# Patient Record
Sex: Male | Born: 1970 | Race: White | Hispanic: No | Marital: Single | Smoking: Current every day smoker
Health system: Southern US, Community
[De-identification: ages and names within clinical notes are randomized; demographics above are authoritative.]

## PROBLEM LIST (undated history)

## (undated) DIAGNOSIS — B1920 Unspecified viral hepatitis C without hepatic coma: Secondary | ICD-10-CM

## (undated) DIAGNOSIS — F1910 Other psychoactive substance abuse, uncomplicated: Secondary | ICD-10-CM

## (undated) DIAGNOSIS — T401X1A Poisoning by heroin, accidental (unintentional), initial encounter: Secondary | ICD-10-CM

## (undated) HISTORY — PX: LEG SURGERY: SHX1003

---

## 1998-10-20 ENCOUNTER — Inpatient Hospital Stay (HOSPITAL_COMMUNITY): Admission: EM | Admit: 1998-10-20 | Discharge: 1998-10-21 | Payer: Self-pay | Admitting: *Deleted

## 1998-10-20 ENCOUNTER — Emergency Department (HOSPITAL_COMMUNITY): Admission: EM | Admit: 1998-10-20 | Discharge: 1998-10-20 | Payer: Self-pay | Admitting: Emergency Medicine

## 1998-10-20 ENCOUNTER — Encounter: Payer: Self-pay | Admitting: Emergency Medicine

## 1998-10-20 ENCOUNTER — Encounter: Payer: Self-pay | Admitting: *Deleted

## 1999-03-25 ENCOUNTER — Encounter (HOSPITAL_COMMUNITY): Admission: RE | Admit: 1999-03-25 | Discharge: 1999-04-08 | Payer: Self-pay | Admitting: Psychiatry

## 2003-08-08 ENCOUNTER — Emergency Department (HOSPITAL_COMMUNITY): Admission: EM | Admit: 2003-08-08 | Discharge: 2003-08-08 | Payer: Self-pay | Admitting: Emergency Medicine

## 2003-10-30 ENCOUNTER — Inpatient Hospital Stay (HOSPITAL_COMMUNITY): Admission: AD | Admit: 2003-10-30 | Discharge: 2003-11-04 | Payer: Self-pay | Admitting: Psychiatry

## 2003-11-06 ENCOUNTER — Other Ambulatory Visit (HOSPITAL_COMMUNITY): Admission: RE | Admit: 2003-11-06 | Discharge: 2004-02-04 | Payer: Self-pay | Admitting: Psychiatry

## 2003-11-27 ENCOUNTER — Emergency Department (HOSPITAL_COMMUNITY): Admission: EM | Admit: 2003-11-27 | Discharge: 2003-11-27 | Payer: Self-pay | Admitting: Emergency Medicine

## 2004-02-19 ENCOUNTER — Emergency Department (HOSPITAL_COMMUNITY): Admission: EM | Admit: 2004-02-19 | Discharge: 2004-02-19 | Payer: Self-pay | Admitting: Emergency Medicine

## 2004-03-06 ENCOUNTER — Encounter: Admission: RE | Admit: 2004-03-06 | Discharge: 2004-03-06 | Payer: Self-pay | Admitting: Neurology

## 2005-07-06 ENCOUNTER — Emergency Department (HOSPITAL_COMMUNITY): Admission: EM | Admit: 2005-07-06 | Discharge: 2005-07-06 | Payer: Self-pay | Admitting: Emergency Medicine

## 2006-08-05 ENCOUNTER — Ambulatory Visit (HOSPITAL_COMMUNITY): Admission: AD | Admit: 2006-08-05 | Discharge: 2006-08-06 | Payer: Self-pay | Admitting: General Surgery

## 2008-03-23 ENCOUNTER — Emergency Department (HOSPITAL_COMMUNITY): Admission: EM | Admit: 2008-03-23 | Discharge: 2008-03-23 | Payer: Self-pay | Admitting: Emergency Medicine

## 2008-04-17 ENCOUNTER — Emergency Department (HOSPITAL_COMMUNITY): Admission: EM | Admit: 2008-04-17 | Discharge: 2008-04-17 | Payer: Self-pay | Admitting: Emergency Medicine

## 2008-07-12 ENCOUNTER — Emergency Department (HOSPITAL_BASED_OUTPATIENT_CLINIC_OR_DEPARTMENT_OTHER): Admission: EM | Admit: 2008-07-12 | Discharge: 2008-07-12 | Payer: Self-pay | Admitting: Emergency Medicine

## 2011-04-18 NOTE — Op Note (Signed)
NAME:  Joel Sheppard, Joel Sheppard              ACCOUNT NO.:  [id number]   MEDICAL RECORD NO.:  [id number]          PATIENT TYPE:  AMB   LOCATION:  DAY                          FACILITY:  [facility]   PHYSICIAN:  Angelia Mould. Derrell Lolling, M.D.DATE OF BIRTH:  07-Oct-1971   DATE OF PROCEDURE:  08/05/2006  DATE OF DISCHARGE:                                 OPERATIVE REPORT   PREOPERATIVE DIAGNOSIS:  Recurrent perirectal abscess.   POSTOPERATIVE DIAGNOSIS:  Recurrent perirectal abscess.   OPERATION PERFORMED:  Examination under anesthesia, anoscopy, incision and  drainage of recurrent perirectal abscess - right posterior and posterior  midline.   SURGEON:  Angelia Mould. Derrell Lolling, M.D.   OPERATIVE INDICATIONS:  This is a 40 year old white man who has had multiple  perirectal abscesses in the past.  He apparently was hospitalized in the  past and had extensive debridement on both sides of his perianal areas but  did not require colostomy.  He denies a history of Crohn's disease, states  his appetite is excellent.  Abdomen feels fine.  Denies diarrhea or change  in his bowel habits.  On exam, he has long complex bilateral scars almost  completely encircling the anus.  In the right posterior position, there is  tenderness and induration but no active drainage.  Right anterior, things  are fairly soft and nontender; left lateral, there is no sign of any  induration or infection.  He is brought to operating room for examination  under anesthesia and hopefully drainage of his abscess.   OPERATIVE TECHNIQUE:  Following the induction of general endotracheal  anesthesia, the patient was placed in a dorsolithotomy position in rigid  stirrups, and the perianal area was prepped and draped in sterile fashion.  I used a syringe and an 18-gauge needle to probe around the right posterior  position until I found a purulent fluid collection which was aspirated and  sent for routine culture.  I then reentered one of the old  incisions in the  right posterior position, made the incision about 3 cm in length and entered  the abscess cavity and drained it.  I digitally explored this cavity and  broke up loculations and found that it was quite localized.  I noticed a  little bit of purulent drainage from sinus tract in the posterior midline,  and I made a vertically sagittally oriented incision through this but really  did not find much of an abscess cavity there, although I did probe it and  explore it fairly extensively.  It did not seem to communicate with the  right posterior cavity.  There was no other induration or purulence or  drainage.  This wounds were irrigated copiously with saline.  Iodoform wicks  were placed in the wounds, and bulky absorbent bandages were placed  externally, and fishnet panties to hold these in place were placed.  The  patient tolerated the procedure well and was taken to the recovery room in  stable condition.  Estimated blood loss was about 30 mL.  Complications  none.  Sponge and instrument counts were correct.      Angelia Mould.  Derrell Lolling, M.D.  Electronically Signed     HMI/MEDQ  D:  08/05/2006  T:  08/06/2006  Job:  [id number]

## 2011-04-18 NOTE — Discharge Summary (Signed)
NAME:  Joel Sheppard, Joel Sheppard NO.:  [id number]   MEDICAL RECORD NO.:  [id number]                   PATIENT TYPE:  IPS   LOCATION:  0508                                 FACILITY:  [facility]   PHYSICIAN:  Geoffery Lyons, M.D.                   DATE OF BIRTH:  [birth year]   DATE OF ADMISSION:  10/30/2003  DATE OF DISCHARGE:  11/04/2003                                 DISCHARGE SUMMARY   CHIEF COMPLAINT AND PRESENT ILLNESS:  This was the first admission to [facility] for this 40 year old single white male voluntarily  admitted.  History of depression, suicidal ideation, no specific plan, crack  cocaine relapse.  Unable to control.  Lost her job due to absenteeism.  Increased sleep, 12-15 hours per day.  No change in appetite.  Denies any  hallucinations.  Noncompliant with medication.   PAST PSYCHIATRIC HISTORY:  Has seen Dr. Kathrynn Running but this is first time at  [facility].  Diagnosed bipolar.   ALCOHOL/DRUG HISTORY:  As already stated, ongoing use of cocaine.   MEDICAL HISTORY:  Noncontributory.   MEDICATIONS:  Depakote 500 mg in the morning and 1000 mg at night, Zoloft 50  mg per day, Seroquel 100 mg at bedtime.   PHYSICAL EXAMINATION:  Performed and failed to show any acute findings.   MENTAL STATUS EXAM:  Alert, cooperative male in bed.  Agreeable to answering  a few questions with bed sheet over her head.  Speech with yes and no  answers.  Mood tired, somewhat irritable.  Affect irritable.  Thought  process coherent.  Limited information.  No evidence of delusion.  Cognition  well-preserved.   ADMISSION DIAGNOSES:   AXIS I:  1. Mood disorder not otherwise specified.  2. Cocaine dependence.   AXIS II:  No diagnosis.   AXIS III:  No diagnosis.   AXIS IV:  Moderate.   AXIS V:  Global Assessment of Functioning upon admission 35; highest Global  Assessment of Functioning in the last year 65.   HOSPITAL COURSE:  He was admitted and  started intensive individual and group  psychotherapy.  He was kept on the Depakote 500 mg in the morning, 1000 mg  at night, Zoloft 50 mg daily, Seroquel 25 mg every six hours as needed and  100 mg at bedtime.  Depakote level was ordered.  Could not sleep at night.  Tired during the day.  Had anxiety and agitation, so Seroquel was increased  to 200 mg at bedtime and he was given 50 mg every six hours as needed for  anxiety and agitation.  Eventually, Seroquel was increased to 250 mg and the  Depakote was changed to 1500 mg at night.  It was later increased to 2000 mg  at night and the Seroquel was increased to 350 mg.  He was in his room a lot  of the time.  He did experience a lot of difficulty with the relapse,  difficulty coping with the depression, got with the wrong people, as he  claimed, relapsed.  Girlfriend told him pretty much that she was not willing  to put up with his behavior.  He wants out.  He became very upset, unable to  find a stable job, having back pain.  He lost his job due to absenteeism as  he claimed he could not go to work due to the back pain.  As the  hospitalization progressed, he became a little bit more involved.  There was  a family session with the girlfriend that did claim that she was tired as  she had a 58 year old addicted son.  They both recognized the need for  counseling.  On November 03, 2003, he was not suicidal.  Still dealing with  the financial difficulties, not having a job and relationship problems but  motivated to pursue further outpatient treatment.  On November 04, 2003, she  was in full contact with reality.  No suicidal ideation.  No homicidal  ideation.  No hallucinations.  No delusions.  Increased insight.  Willing to  pursue further outpatient treatment to the [facility].  We went ahead  and discharged to outpatient follow-up.   DISCHARGE DIAGNOSES:   AXIS I:  1. Mood disorder not otherwise specified.  2. Cocaine dependence.    AXIS II:  No diagnosis.   AXIS III:  No diagnosis.   AXIS IV:  Moderate.   AXIS V:  Global Assessment of Functioning upon discharge 50.   DISCHARGE MEDICATIONS:  1. Zoloft 50 mg per day.  2. Depakote ER 500 mg, 4 at night.  3. Seroquel 200 mg, 2 at night.  4. Symmetrel 100 mg twice a day.  5. Trazodone 100 mg at bedtime as needed.  6. Seroquel 25 mg, 2 twice a day as needed for anxiety and agitation.   FOLLOW UP:  Dr. Kathrynn Running and [facility].                                               Geoffery Lyons, M.D.    IL/MEDQ  D:  11/17/2003  T:  11/18/2003  Job:  [id number]

## 2011-08-29 LAB — WOUND CULTURE: Gram Stain: NONE SEEN

## 2016-01-06 ENCOUNTER — Encounter: Payer: Self-pay | Admitting: Emergency Medicine

## 2016-01-06 ENCOUNTER — Emergency Department
Admission: EM | Admit: 2016-01-06 | Discharge: 2016-01-06 | Disposition: A | Discharge status: Home / Self Care | Payer: BLUE CROSS/BLUE SHIELD | Source: Home / Self Care | Attending: Family Medicine | Admitting: Family Medicine

## 2016-01-06 DIAGNOSIS — S61411A Laceration without foreign body of right hand, initial encounter: Secondary | ICD-10-CM | POA: Diagnosis not present

## 2016-01-06 MED ORDER — IBUPROFEN 800 MG PO TABS
800.0000 mg | ORAL_TABLET | Freq: Once | ORAL | Status: AC
  Administered 2016-01-06: 800 mg via ORAL

## 2016-01-06 NOTE — Discharge Instructions (Signed)
Change dressing daily and apply Bacitracin ointment to wound.  Keep wound clean and dry.  Return for any signs of infection (or follow-up with family doctor):  Increasing redness, swelling, pain, heat, drainage, etc. °Return in 10 days for suture removal.   ° ° °Laceration Care, Adult °A laceration is a cut that goes through all of the layers of the skin and into the tissue that is right under the skin. Some lacerations heal on their own. Others need to be closed with stitches (sutures), staples, skin adhesive strips, or skin glue. Proper laceration care minimizes the risk of infection and helps the laceration to heal better. °HOW TO CARE FOR YOUR LACERATION °If sutures or staples were used: °· Keep the wound clean and dry. °· If you were given a bandage (dressing), you should change it at least one time per day or as told by your health care provider. You should also change it if it becomes wet or dirty. °· Keep the wound completely dry for the first 24 hours or as told by your health care provider. After that time, you may shower or bathe. However, make sure that the wound is not soaked in water until after the sutures or staples have been removed. °· Clean the wound one time each day or as told by your health care provider: °¨ Wash the wound with soap and water. °¨ Rinse the wound with water to remove all soap. °¨ Pat the wound dry with a clean towel. Do not rub the wound. °· After cleaning the wound, apply a thin layer of antibiotic ointment as told by your health care provider. This will help to prevent infection and keep the dressing from sticking to the wound. °· Have the sutures or staples removed as told by your health care provider. °If skin adhesive strips were used: °· Keep the wound clean and dry. °· If you were given a bandage (dressing), you should change it at least one time per day or as told by your health care provider. You should also change it if it becomes dirty or wet. °· Do not get the skin  adhesive strips wet. You may shower or bathe, but be careful to keep the wound dry. °· If the wound gets wet, pat it dry with a clean towel. Do not rub the wound. °· Skin adhesive strips fall off on their own. You may trim the strips as the wound heals. Do not remove skin adhesive strips that are still stuck to the wound. They will fall off in time. °If skin glue was used: °· Try to keep the wound dry, but you may briefly wet it in the shower or bath. Do not soak the wound in water, such as by swimming. °· After you have showered or bathed, gently pat the wound dry with a clean towel. Do not rub the wound. °· Do not do any activities that will make you sweat heavily until the skin glue has fallen off on its own. °· Do not apply liquid, cream, or ointment medicine to the wound while the skin glue is in place. Using those may loosen the film before the wound has healed. °· If you were given a bandage (dressing), you should change it at least one time per day or as told by your health care provider. You should also change it if it becomes dirty or wet. °· If a dressing is placed over the wound, be careful not to apply tape directly over the skin glue.   Doing that may cause the glue to be pulled off before the wound has healed. °· Do not pick at the glue. The skin glue usually remains in place for 5-10 days, then it falls off of the skin. °General Instructions °· Take over-the-counter and prescription medicines only as told by your health care provider. °· If you were prescribed an antibiotic medicine or ointment, take or apply it as told by your doctor. Do not stop using it even if your condition improves. °· To help prevent scarring, make sure to cover your wound with sunscreen whenever you are outside after stitches are removed, after adhesive strips are removed, or when glue remains in place and the wound is healed. Make sure to wear a sunscreen of at least 30 SPF. °· Do not scratch or pick at the wound. °· Keep all  follow-up visits as told by your health care provider. This is important. °· Check your wound every day for signs of infection. Watch for: °¨ Redness, swelling, or pain. °¨ Fluid, blood, or pus. °· Raise (elevate) the injured area above the level of your heart while you are sitting or lying down, if possible. °SEEK MEDICAL CARE IF: °· You received a tetanus shot and you have swelling, severe pain, redness, or bleeding at the injection site. °· You have a fever. °· A wound that was closed breaks open. °· You notice a bad smell coming from your wound or your dressing. °· You notice something coming out of the wound, such as wood or glass. °· Your pain is not controlled with medicine. °· You have increased redness, swelling, or pain at the site of your wound. °· You have fluid, blood, or pus coming from your wound. °· You notice a change in the color of your skin near your wound. °· You need to change the dressing frequently due to fluid, blood, or pus draining from the wound. °· You develop a new rash. °· You develop numbness around the wound. °SEEK IMMEDIATE MEDICAL CARE IF: °· You develop severe swelling around the wound. °· Your pain suddenly increases and is severe. °· You develop painful lumps near the wound or on skin that is anywhere on your body. °· You have a red streak going away from your wound. °· The wound is on your hand or foot and you cannot properly move a finger or toe. °· The wound is on your hand or foot and you notice that your fingers or toes look pale or bluish. °  °This information is not intended to replace advice given to you by your health care provider. Make sure you discuss any questions you have with your health care provider. °  °Document Released: 11/17/2005 Document Revised: 04/03/2015 Document Reviewed: 11/13/2014 °Elsevier Interactive Patient Education ©2016 [employer] ° °

## 2016-01-06 NOTE — ED Notes (Signed)
Pt cut on right hand from moving a couch today.  TD x 1 year ago.

## 2016-01-06 NOTE — ED Provider Notes (Signed)
CSN: [id number]     Arrival date & time 01/06/16  1248 History   First MD Initiated Contact with Patient 01/06/16 1430     Chief Complaint  Patient presents with  . Extremity Laceration     HPI Comments: While lifting a couch today, patient lacerated his right hand on a sharp metal edge beneath the couch.  His last Tdap was one year ago.  Patient is a 45 y.o. male presenting with skin laceration. The history is provided by the patient.  Laceration Location:  Hand Hand laceration location:  R hand Length (cm):  2 Depth:  Through dermis Quality: straight   Bleeding: controlled   Time since incident:  2 hours Laceration mechanism:  Metal edge Pain details:    Quality:  Aching   Severity:  Mild   Timing:  Intermittent   Progression:  Improving Foreign body present:  No foreign bodies Relieved by:  Pressure Worsened by:  Movement Tetanus status:  Up to date   History reviewed. No pertinent past medical history. Past Surgical History  Procedure Laterality Date  . Leg surgery     No family history on file. Social History  Substance Use Topics  . Smoking status: Current Every Day Smoker  . Smokeless tobacco: None  . Alcohol Use: No    Review of Systems  All other systems reviewed and are negative.   Allergies  Review of patient's allergies indicates not on file.  Home Medications   Prior to Admission medications   Not on File   Meds Ordered and Administered this Visit   Medications  ibuprofen (ADVIL,MOTRIN) tablet 800 mg (800 mg Oral Given 01/06/16 1354)    BP 151/86 mmHg  Pulse 84  Temp(Src) 97.7 F (36.5 C) (Oral)  Wt 200 lb 8 oz (90.946 kg)  SpO2 97% No data found.   Physical Exam  Constitutional: He is oriented to person, place, and time. He appears well-developed and well-nourished. No distress.  HENT:  Head: Atraumatic.  Eyes: Pupils are equal, round, and reactive to light.  Musculoskeletal:       Right hand: He exhibits laceration. He exhibits  normal range of motion, no tenderness, normal capillary refill and no swelling. Normal sensation noted. Normal strength noted.       Hands: On the dorsal surface of the right hand, between the 3rd and 4th fingers, is a 2cm long simple superficial laceration as noted on diagram.  All fingers have full range of motion.  Distal neurovascular function is intact.     Neurological: He is alert and oriented to person, place, and time.  Skin: Skin is warm and dry.  Nursing note and vitals reviewed.   ED Course  Procedures  Laceration Repair Discussed benefits and risks of procedure and verbal consent obtained. Using sterile technique and local anesthesia with 1% lidocaine without epinephrine, cleansed wound with Betadine followed by copious lavage with normal saline.  Wound carefully inspected for debris and foreign bodies; none found.  Wound closed with #5, 4-0 interrupted nylon sutures.  Bacitracin and non-stick sterile dressing applied.  Wound precautions explained to patient.  Return for suture removal in 10 days.   MDM   1. Laceration of right hand, initial encounter     Change dressing daily and apply Bacitracin ointment to wound.  Keep wound clean and dry.  Return for any signs of infection (or follow-up with family doctor):  Increasing redness, swelling, pain, heat, drainage, etc. Return in 10 days for suture removal.  Lattie Haw, MD 01/10/16 1048

## 2016-09-05 ENCOUNTER — Inpatient Hospital Stay (HOSPITAL_COMMUNITY)
Admission: EM | Admit: 2016-09-05 | Discharge: 2016-09-06 | DRG: 918 | Disposition: A | Discharge status: Home / Self Care | Payer: Medicaid Other | Attending: Internal Medicine | Admitting: Internal Medicine

## 2016-09-05 DIAGNOSIS — B1920 Unspecified viral hepatitis C without hepatic coma: Secondary | ICD-10-CM | POA: Diagnosis present

## 2016-09-05 DIAGNOSIS — D72829 Elevated white blood cell count, unspecified: Secondary | ICD-10-CM | POA: Diagnosis present

## 2016-09-05 DIAGNOSIS — E875 Hyperkalemia: Secondary | ICD-10-CM | POA: Diagnosis present

## 2016-09-05 DIAGNOSIS — F17210 Nicotine dependence, cigarettes, uncomplicated: Secondary | ICD-10-CM | POA: Diagnosis present

## 2016-09-05 DIAGNOSIS — F17200 Nicotine dependence, unspecified, uncomplicated: Secondary | ICD-10-CM

## 2016-09-05 DIAGNOSIS — R0902 Hypoxemia: Secondary | ICD-10-CM | POA: Diagnosis present

## 2016-09-05 DIAGNOSIS — R748 Abnormal levels of other serum enzymes: Secondary | ICD-10-CM | POA: Diagnosis present

## 2016-09-05 DIAGNOSIS — F1910 Other psychoactive substance abuse, uncomplicated: Secondary | ICD-10-CM | POA: Diagnosis present

## 2016-09-05 DIAGNOSIS — F419 Anxiety disorder, unspecified: Secondary | ICD-10-CM | POA: Diagnosis present

## 2016-09-05 DIAGNOSIS — E872 Acidosis, unspecified: Secondary | ICD-10-CM | POA: Diagnosis present

## 2016-09-05 DIAGNOSIS — T401X1A Poisoning by heroin, accidental (unintentional), initial encounter: Secondary | ICD-10-CM

## 2016-09-05 DIAGNOSIS — R740 Nonspecific elevation of levels of transaminase and lactic acid dehydrogenase [LDH]: Secondary | ICD-10-CM

## 2016-09-05 DIAGNOSIS — N179 Acute kidney failure, unspecified: Secondary | ICD-10-CM | POA: Diagnosis present

## 2016-09-05 DIAGNOSIS — T17908A Unspecified foreign body in respiratory tract, part unspecified causing other injury, initial encounter: Secondary | ICD-10-CM | POA: Diagnosis not present

## 2016-09-05 DIAGNOSIS — T50901A Poisoning by unspecified drugs, medicaments and biological substances, accidental (unintentional), initial encounter: Secondary | ICD-10-CM | POA: Diagnosis present

## 2016-09-05 DIAGNOSIS — N19 Unspecified kidney failure: Secondary | ICD-10-CM

## 2016-09-05 DIAGNOSIS — R4182 Altered mental status, unspecified: Secondary | ICD-10-CM | POA: Diagnosis present

## 2016-09-05 DIAGNOSIS — Z8619 Personal history of other infectious and parasitic diseases: Secondary | ICD-10-CM

## 2016-09-05 DIAGNOSIS — Y92009 Unspecified place in unspecified non-institutional (private) residence as the place of occurrence of the external cause: Secondary | ICD-10-CM

## 2016-09-05 DIAGNOSIS — T405X1A Poisoning by cocaine, accidental (unintentional), initial encounter: Secondary | ICD-10-CM | POA: Diagnosis present

## 2016-09-05 HISTORY — DX: Poisoning by heroin, accidental (unintentional), initial encounter: T40.1X1A

## 2016-09-05 LAB — BASIC METABOLIC PANEL
ANION GAP: 7 (ref 5–15)
Anion gap: 10 (ref 5–15)
BUN: 22 mg/dL — AB (ref 6–20)
BUN: 22 mg/dL — ABNORMAL HIGH (ref 6–20)
CALCIUM: 7.8 mg/dL — AB (ref 8.9–10.3)
CHLORIDE: 107 mmol/L (ref 101–111)
CO2: 20 mmol/L — ABNORMAL LOW (ref 22–32)
CO2: 24 mmol/L (ref 22–32)
CREATININE: 1.71 mg/dL — AB (ref 0.61–1.24)
Calcium: 8 mg/dL — ABNORMAL LOW (ref 8.9–10.3)
Chloride: 110 mmol/L (ref 101–111)
Creatinine, Ser: 1.45 mg/dL — ABNORMAL HIGH (ref 0.61–1.24)
GFR calc Af Amer: 54 mL/min — ABNORMAL LOW (ref >60)
GFR calc non Af Amer: 57 mL/min — ABNORMAL LOW (ref >60)
GFR, EST NON AFRICAN AMERICAN: 47 mL/min — AB (ref >60)
Glucose, Bld: 85 mg/dL (ref 65–99)
Glucose, Bld: 110 mg/dL — ABNORMAL HIGH (ref 65–99)
POTASSIUM: 4.2 mmol/L (ref 3.5–5.1)
Potassium: 4.8 mmol/L (ref 3.5–5.1)
SODIUM: 140 mmol/L (ref 135–145)
SODIUM: 138 mmol/L (ref 135–145)

## 2016-09-05 LAB — I-STAT ARTERIAL BLOOD GAS, ED
ACID-BASE DEFICIT: 7 mmol/L — AB (ref 0.0–2.0)
ACID-BASE DEFICIT: 4 mmol/L — AB (ref 0.0–2.0)
BICARBONATE: 21.5 mmol/L (ref 20.0–28.0)
Bicarbonate: 20.3 mmol/L (ref 20.0–28.0)
O2 SAT: 99 %
O2 Saturation: 88 %
PCO2 ART: 40.5 mmHg (ref 32.0–48.0)
PH ART: 7.241 — AB (ref 7.350–7.450)
PO2 ART: 158 mmHg — AB (ref 83.0–108.0)
PO2 ART: 58 mmHg — AB (ref 83.0–108.0)
Patient temperature: 98.6
Patient temperature: 97.8
TCO2: 22 mmol/L (ref 0–100)
TCO2: 23 mmol/L (ref 0–100)
pCO2 arterial: 47.1 mmHg (ref 32.0–48.0)
pH, Arterial: 7.331 — ABNORMAL LOW (ref 7.350–7.450)

## 2016-09-05 LAB — COMPREHENSIVE METABOLIC PANEL
ALT: 68 U/L — AB (ref 17–63)
AST: 100 U/L — AB (ref 15–41)
Albumin: 4.5 g/dL (ref 3.5–5.0)
Alkaline Phosphatase: 88 U/L (ref 38–126)
Anion gap: 26 — ABNORMAL HIGH (ref 5–15)
BILIRUBIN TOTAL: 0.8 mg/dL (ref 0.3–1.2)
BUN: 19 mg/dL (ref 6–20)
CO2: 15 mmol/L — ABNORMAL LOW (ref 22–32)
CREATININE: 2.46 mg/dL — AB (ref 0.61–1.24)
Calcium: 9.6 mg/dL (ref 8.9–10.3)
Chloride: 105 mmol/L (ref 101–111)
GFR, EST AFRICAN AMERICAN: 35 mL/min — AB (ref >60)
GFR, EST NON AFRICAN AMERICAN: 30 mL/min — AB (ref >60)
Glucose, Bld: 143 mg/dL — ABNORMAL HIGH (ref 65–99)
Potassium: 5.6 mmol/L — ABNORMAL HIGH (ref 3.5–5.1)
Sodium: 146 mmol/L — ABNORMAL HIGH (ref 135–145)
TOTAL PROTEIN: 7.1 g/dL (ref 6.5–8.1)

## 2016-09-05 LAB — CBC WITH DIFFERENTIAL/PLATELET
BASOS ABS: 0.1 10*3/uL (ref 0.0–0.1)
Basophils Relative: 0 %
Eosinophils Absolute: 0.1 10*3/uL (ref 0.0–0.7)
Eosinophils Relative: 0 %
HEMATOCRIT: 51.8 % (ref 39.0–52.0)
Hemoglobin: 16 g/dL (ref 13.0–17.0)
Lymphocytes Relative: 20 %
Lymphs Abs: 3.5 10*3/uL (ref 0.7–4.0)
MCH: 31.7 pg (ref 26.0–34.0)
MCHC: 30.9 g/dL (ref 30.0–36.0)
MCV: 102.8 fL — AB (ref 78.0–100.0)
MONO ABS: 0.5 10*3/uL (ref 0.1–1.0)
Monocytes Relative: 3 %
Neutro Abs: 13.1 10*3/uL — ABNORMAL HIGH (ref 1.7–7.7)
Neutrophils Relative %: 77 %
Platelets: 276 10*3/uL (ref 150–400)
RBC: 5.04 MIL/uL (ref 4.22–5.81)
RDW: 13.2 % (ref 11.5–15.5)
WBC: 17.3 10*3/uL — AB (ref 4.0–10.5)

## 2016-09-05 LAB — I-STAT TROPONIN, ED: TROPONIN I, POC: 0.05 ng/mL (ref 0.00–0.08)

## 2016-09-05 LAB — RAPID URINE DRUG SCREEN, HOSP PERFORMED
AMPHETAMINES: NOT DETECTED
BARBITURATES: NOT DETECTED
BENZODIAZEPINES: NOT DETECTED
Cocaine: POSITIVE — AB
Opiates: NOT DETECTED
TETRAHYDROCANNABINOL: NOT DETECTED

## 2016-09-05 LAB — ETHANOL: Alcohol, Ethyl (B): <5 mg/dL (ref <5)

## 2016-09-05 LAB — ACETAMINOPHEN LEVEL: Acetaminophen (Tylenol), Serum: <10 ug/mL — ABNORMAL LOW (ref 10–30)

## 2016-09-05 LAB — I-STAT CG4 LACTIC ACID, ED: Lactic Acid, Venous: 1.07 mmol/L (ref 0.5–1.9)

## 2016-09-05 LAB — SALICYLATE LEVEL: Salicylate Lvl: <7 mg/dL (ref 2.8–30.0)

## 2016-09-05 LAB — CK: CK TOTAL: 130 U/L (ref 49–397)

## 2016-09-05 MED ORDER — SODIUM CHLORIDE 0.9 % IV BOLUS (SEPSIS)
1000.0000 mL | Freq: Once | INTRAVENOUS | Status: AC
  Administered 2016-09-05: 1000 mL via INTRAVENOUS

## 2016-09-05 MED ORDER — HEPARIN SODIUM (PORCINE) 5000 UNIT/ML IJ SOLN
5000.0000 [IU] | Freq: Three times a day (TID) | INTRAMUSCULAR | Status: DC
  Administered 2016-09-05 – 2016-09-06 (×2): 5000 [IU] via SUBCUTANEOUS
  Filled 2016-09-05 (×2): qty 1

## 2016-09-05 MED ORDER — SODIUM CHLORIDE 0.9% FLUSH
3.0000 mL | Freq: Two times a day (BID) | INTRAVENOUS | Status: DC
  Administered 2016-09-05: 3 mL via INTRAVENOUS

## 2016-09-05 MED ORDER — SODIUM CHLORIDE 0.9 % IV SOLN
1000.0000 mL | INTRAVENOUS | Status: DC
  Administered 2016-09-05 – 2016-09-06 (×2): 1000 mL via INTRAVENOUS

## 2016-09-05 MED ORDER — NICOTINE 14 MG/24HR TD PT24
14.0000 mg | MEDICATED_PATCH | Freq: Every day | TRANSDERMAL | Status: DC
  Administered 2016-09-05: 14 mg via TRANSDERMAL
  Filled 2016-09-05: qty 1

## 2016-09-05 MED ORDER — HALOPERIDOL LACTATE 5 MG/ML IJ SOLN
5.0000 mg | Freq: Once | INTRAMUSCULAR | Status: AC
  Administered 2016-09-05: 5 mg via INTRAVENOUS

## 2016-09-05 MED ORDER — LORAZEPAM 2 MG/ML IJ SOLN
2.0000 mg | Freq: Once | INTRAMUSCULAR | Status: AC
  Administered 2016-09-05: 2 mg via INTRAVENOUS

## 2016-09-05 MED ORDER — LORAZEPAM 2 MG/ML IJ SOLN
INTRAMUSCULAR | Status: AC
  Filled 2016-09-05: qty 1

## 2016-09-05 MED ORDER — SODIUM CHLORIDE 0.9 % IV SOLN
1000.0000 mL | Freq: Once | INTRAVENOUS | Status: AC
  Administered 2016-09-05: 1000 mL via INTRAVENOUS

## 2016-09-05 MED ORDER — LORAZEPAM 2 MG/ML IJ SOLN: 1.0000 mg | Freq: Four times a day (QID) | INTRAMUSCULAR | Status: DC | PRN

## 2016-09-05 NOTE — ED Notes (Signed)
Pt continues to appear very drowsy, pupils 2mm bilat. Family at bedside. Pt does answer questions appropriately. Pt eating and drinking without difficulty.

## 2016-09-05 NOTE — ED Notes (Signed)
RN spoke with poison control Joel Delay([name]) and updated her on repeat labs.   Pt. Refusing to keep oxygen on at this time. Oxygen saturation between 92-95% on room air.

## 2016-09-05 NOTE — ED Notes (Signed)
Admitting at bedside 

## 2016-09-05 NOTE — ED Notes (Signed)
Significant other Merry Proud(Megan Larson) [phone]

## 2016-09-05 NOTE — ED Notes (Signed)
RT at bedside for ABG

## 2016-09-05 NOTE — ED Provider Notes (Signed)
[facility]EMERGENCY DEPT Provider Note   CSN: [id number] Arrival date & time: 09/05/16  1157     History   Chief Complaint Chief Complaint  Patient presents with  . Drug Overdose    HPI Joel Sheppard is a 45 y.o. male hx of depression, here with Altered mental status, lethargy, possible overdose. Patient has a rocky relationship with girlfriend. They recently broke up and then reunited yesterday. They started doing drugs together in particular they were injecting some cocaine as well as heroin. She was assured of heroin was laced with something else. They went to bed around 5 AM and woke up around 10:30 AM. She tried to wake him up but was unable to so called EMS. As per EMS, patient was unresponsive and hypoxic       The history is provided by the patient, the EMS personnel and the spouse.   Level V caveat- AMS, overdose  No past medical history on file.  There are no active problems to display for this patient.   Past Surgical History:  Procedure Laterality Date  . LEG SURGERY         Home Medications    Prior to Admission medications   Not on File    Family History No family history on file.  Social History Social History  Substance Use Topics  . Smoking status: Current Every Day Smoker  . Smokeless tobacco: Not on file  . Alcohol use No     Allergies   Review of patient's allergies indicates not on file.   Review of Systems Review of Systems  Unable to perform ROS: Mental status change  All other systems reviewed and are negative.    Physical Exam Updated Vital Signs BP 91/58   Pulse 88   Resp 14   SpO2 100%   Physical Exam  Constitutional:  Agitated   HENT:  Head: Normocephalic.  Eyes:  Pinpoint pupils bilaterally   Neck: Normal range of motion. Neck supple.  Cardiovascular: Normal rate, regular rhythm and normal heart sounds.   Pulmonary/Chest:  Decreased effort. No obvious crackles   Abdominal: Soft. Bowel sounds are normal. He  exhibits no distension. There is no tenderness. There is no guarding.  Musculoskeletal: Normal range of motion.  Neurological:  Agitated, moving all extremities. Refused to follow commands.   Skin: Skin is warm.  Psychiatric:  Agitated.   Nursing note and vitals reviewed.    ED Treatments / Results  Labs (all labs ordered are listed, but only abnormal results are displayed) Labs Reviewed  CBC WITH DIFFERENTIAL/PLATELET - Abnormal; Notable for the following:       Result Value   WBC 17.3 (*)    MCV 102.8 (*)    Neutro Abs 13.1 (*)    All other components within normal limits  COMPREHENSIVE METABOLIC PANEL - Abnormal; Notable for the following:    Sodium 146 (*)    Potassium 5.6 (*)    CO2 15 (*)    Glucose, Bld 143 (*)    Creatinine, Ser 2.46 (*)    AST 100 (*)    ALT 68 (*)    GFR calc non Af Amer 30 (*)    GFR calc Af Amer 35 (*)    Anion gap 26 (*)    All other components within normal limits  ACETAMINOPHEN LEVEL - Abnormal; Notable for the following:    Acetaminophen (Tylenol), Serum <10 (*)    All other components within normal limits  ETHANOL  SALICYLATE  LEVEL  URINE RAPID DRUG SCREEN, HOSP PERFORMED  BLOOD GAS, ARTERIAL  BASIC METABOLIC PANEL  I-STAT TROPOININ, ED    EKG  EKG Interpretation  Date/Time:  Friday September 05 2016 12:18:53 EDT Ventricular Rate:  102 PR Interval:    QRS Duration: 121 QT Interval:  339 QTC Calculation: 442 R Axis:   76 Text Interpretation:  Sinus tachycardia Biatrial enlargement Nonspecific intraventricular conduction delay No significant change since last tracing Confirmed by Kinley Ferrentino  MD, Nashaun Hillmer ([id number]) on 09/05/2016 12:20:59 PM       Radiology No results found.  Procedures Procedures (including critical care time)  CRITICAL CARE Performed by: Richardean Canalavid H Hiawatha Dressel   Total critical care time: 30 minutes  Critical care time was exclusive of separately billable procedures and treating other patients.  Critical care was necessary  to treat or prevent imminent or life-threatening deterioration.  Critical care was time spent personally by me on the following activities: development of treatment plan with patient and/or surrogate as well as nursing, discussions with consultants, evaluation of patient's response to treatment, examination of patient, obtaining history from patient or surrogate, ordering and performing treatments and interventions, ordering and review of laboratory studies, ordering and review of radiographic studies, pulse oximetry and re-evaluation of patient's condition.   Medications Ordered in ED Medications  sodium chloride 0.9 % bolus 1,000 mL (not administered)  haloperidol lactate (HALDOL) injection 5 mg (5 mg Intravenous Given 09/05/16 1200)  LORazepam (ATIVAN) injection 2 mg (2 mg Intravenous Given 09/05/16 1208)  0.9 %  sodium chloride infusion (1,000 mLs Intravenous New Bag/Given 09/05/16 1205)     Initial Impression / Assessment and Plan / ED Course  I have reviewed the triage vital signs and the nursing notes.  Pertinent labs & imaging results that were available during my care of the patient were reviewed by me and considered in my medical decision making (see chart for details).  Clinical Course    Joel Sheppard is a 45 y.o. male here with AMS, likely overdose. Agitated at bedside. Tried haldol initially with no improvement. Will give ativan.   1:30 pm Labs showed acute renal failure with Cr 2.4. Bicarb 15. AG 26. Girlfriend denies any other ingestion. Will get ABG.   2:30 pm ABG showed pH 7.2. Consulted poison control. They recommend aggressive hydration and check CK, lactate. Repeat CMP at 5pm if CK and lactate normal. Will admit to stepdown.   4:13 PM CK and lactate nl. Internal medicine teaching service to assume care of patient. Updated resident regarding poison control recommendations.    Final Clinical Impressions(s) / ED Diagnoses   Final diagnoses:  None    New  Prescriptions New Prescriptions   No medications on file     Charlynne Panderavid Hsienta Hezzie Karim, MD 09/05/16 1614

## 2016-09-05 NOTE — H&P (Signed)
Date: 09/05/2016               Patient Name:  Joel Sheppard MRN: [id number]  DOB: February 05, 1971 Age / Sex: 45 y.o., male   PCP: No primary care provider on file.         Medical Service: Internal Medicine Teaching Service         Attending Physician: Dr. Cliffton Asters, MD    First Contact: Dr. Nelson Chimes Pager: [phone]  Second Contact: Dr. Earlene Plater Pager: [phone]       After Hours (After 5p/  First Contact Pager: [phone]  weekends / holidays): Second Contact Pager: [phone]   Chief Complaint: Opiate Over dose.  History of Present Illness: Mr. Darling is a 45 yo male with PMHx of Anxiety and Tobacco Abuse who presents from home after his girlfriend found him unresponsive.   Yesterday, patient and his girlfriend used cocaine ($60) and heroin ($40 worth) from 11 pm to 5 am yesterday evening. They both fell asleep and girlfriend states she doesn't think he used much more after she fell asleep as they had used most of their substances. This morning when the girlfriend woke up she realized she couldn't wake him up. She noted he had decreased breathing, was turning blue and was cold, and had spit surrounding his mouth and on the floor. She noted that he was making gargling noises and she couldn't role him over after about 20 minutes of this, his girlfriend called 911. She had Narcan, but did not know how to use it. Patient's girlfriend denies that the patient was using any alcohol yesterday. He does smoke tobacco 1/2 pack per day.   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 His on and off again girlfriend suffers from addiction to alcohol and illicit drugs that she attributes to social stressors.  He was treated for hep C during his stay in prison.    Meds:  No outpatient prescriptions have been marked as taking for the 09/05/16 encounter [facility] Encounter).    Allergies: Allergies as of 09/05/2016  . (Not on File)   Family History: Father died of  leukemia                           Mother has Alzheimer's.  Social History:  Patient has been clean and abstinent from alcohol and illicit drugs for 7 years. He smokes 1/2 ppd of tobacco. He works in [employer]. Patient was in prison for 6 years and was released in December 2016.   Review of Systems: A complete ROS was negative except as per HPI.   Physical Exam: Vitals:   09/05/16 1345 09/05/16 1400 09/05/16 1415 09/05/16 1500  BP: (!) 87/61 96/66 93/63  96/63  Pulse: 87 93 89 91  Resp: 12 15 14 15   SpO2: 100% 97% 100% 100%   General: Vital signs reviewed.  Patient is well-developed and well-nourished, Sleeping with snoring, easily arousable and follows simple commands, in no acute distress Head: Normocephalic and atraumatic. Eyes: Pinpoint pupils, conjunctivae normal, no scleral icterus.  Neck: Supple, trachea midline, normal ROM, no JVD, masses, thyromegaly, or carotid bruit present.  Cardiovascular: RRR, S1 normal, S2 normal, no murmurs, gallops, or rubs. Pulmonary/Chest: Clear to auscultation bilaterally, having some upper respiratory snoring noises.  Abdominal: Soft, non-tender, non-distended, BS +, no masses, organomegaly, or guarding present.  Musculoskeletal: No joint deformities, erythema, or stiffness, ROM full and nontender. Extremities:  No lower extremity edema bilaterally,  pulses symmetric and intact bilaterally. No cyanosis or clubbing. Neurological: Sleeping, easily arousable and follows simple commands, Strength is normal and symmetric bilaterally,  no focal motor deficit, sensory intact to light touch bilaterally.  Skin: Warm, dry and intact. Few healed scars on his both lower legs.   Labs.  CBC    Component Value Date/Time   WBC 17.3 (H) 09/05/2016 1214   RBC 5.04 09/05/2016 1214   HGB 16.0 09/05/2016 1214   HCT 51.8 09/05/2016 1214   PLT 276 09/05/2016 1214   MCV 102.8 (H) 09/05/2016 1214   MCH 31.7 09/05/2016 1214   MCHC 30.9 09/05/2016 1214   RDW 13.2  09/05/2016 1214   LYMPHSABS 3.5 09/05/2016 1214   MONOABS 0.5 09/05/2016 1214   EOSABS 0.1 09/05/2016 1214   BASOSABS 0.1 09/05/2016 1214   CMP Latest Ref Rng & Units 09/05/2016  Glucose 65 - 99 mg/dL 962(X143(H)  BUN 6 - 20 mg/dL 19  Creatinine 5.280.61 - 4.131.24 mg/dL 2.44(W2.46(H)  Sodium 102135 - 725145 mmol/L 146(H)  Potassium 3.5 - 5.1 mmol/L 5.6(H)  Chloride 101 - 111 mmol/L 105  CO2 22 - 32 mmol/L 15(L)  Calcium 8.9 - 10.3 mg/dL 9.6  Total Protein 6.5 - 8.1 g/dL 7.1  Total Bilirubin 0.3 - 1.2 mg/dL 0.8  Alkaline Phos 38 - 126 U/L 88  AST 15 - 41 U/L 100(H)  ALT 17 - 63 U/L 68(H)   ABG    Component Value Date/Time   PHART 7.241 (L) 09/05/2016 1344   PCO2ART 47.1 09/05/2016 1344   PO2ART 158.0 (H) 09/05/2016 1344   HCO3 20.3 09/05/2016 1344   TCO2 22 09/05/2016 1344   ACIDBASEDEF 7.0 (H) 09/05/2016 1344   O2SAT 99.0 09/05/2016 1344   CK. 130  Lactic Acid.1.07  Drugs of Abuse     Component Value Date/Time   LABOPIA NONE DETECTED 09/05/2016 1416   COCAINSCRNUR POSITIVE (A) 09/05/2016 1416   LABBENZ NONE DETECTED 09/05/2016 1416   AMPHETMU NONE DETECTED 09/05/2016 1416   THCU NONE DETECTED 09/05/2016 1416   LABBARB NONE DETECTED 09/05/2016 1416     EKG: Shows normal sinus rhythm with mild intraventricular conduction delays.  Assessment & Plan by Problem:  Mr. Susa DayMcLemore is a 45 yo male with PMHx of Anxiety and Tobacco Abuse who presents from home after his girlfriend found him unresponsive.   Heroin Overdose: He already received Narcan. -Monitor his breathing, and vitals.  Anion Gapped Metabolic Acidosis: He was having metabolic acidosis with anion gap of 26. Most probably due to hypoxemia because of decreased respiratory drive. -Check CK and lactic acid. -Repeat ABG after fluid resuscitation.  Acute Renal Failure: His creatinine was 2.46. Most probably due to Hypoperfusion of the kidneys. Baseline unknown, but he appears to be healthy without any history of hypertension or  diabetes. -IV fluid resuscitation. -Repeat CMP in the morning.  Elevated Transaminases: He has an history of hep C treatment  during his stay in prison. It might be due to shocked liver due to hypoperfusion. -Hep C antibody and RNA level, check antiquated treatment. -Repeat CMP in the morning  H/o Hepatitis C s/p Harvoni Treatment: According to his girlfriend he was treated for hep C and he was in the prison. -Hep C antibody and RNA level, check antiquated treatment.  Hyperkalemia: His potassium was mildly elevated at 5.6 on admission. No peaked T waves on  ECG. -Repeat electrolyte testing after for resuscitation.  Leukocytosis: Most probably having a reactive  leukocytosis, aspiration is a possibility. -Chest x-ray PA and lateral view. -Repeat CBC tomorrow morning.  Tobacco Abuse: He has an history of tobacco abuse about half a pack per day. We can offer nicotine patch if he wants it after becoming more alert.  DVT/PE ppx: Heparin FEN:  CODE: Full.  Dispo: Admit patient to Inpatient with expected length of stay greater than 2 midnights.  Signed: Arnetha Courser, MD 09/05/2016, 2:50 PM  Pager: [phone]

## 2016-09-05 NOTE — ED Notes (Signed)
Attempted report 

## 2016-09-05 NOTE — ED Triage Notes (Signed)
Pt presents from home with c/o overdose via [facility].  Upon EMS arrival pt given 4mg  Intranasal narcan, pt was "purple from nipple line up" and RR4-6/min, bagged patient for a few minutes and began to wake up.  [facility] and [facility], following some commands.  Dr. Silverio LayYao & [employer] at bedside.  PT states took heroin mixed with propanolol.

## 2016-09-06 ENCOUNTER — Inpatient Hospital Stay (HOSPITAL_COMMUNITY): Payer: Medicaid Other

## 2016-09-06 LAB — COMPREHENSIVE METABOLIC PANEL
ALBUMIN: 3.2 g/dL — AB (ref 3.5–5.0)
ALT: 93 U/L — ABNORMAL HIGH (ref 17–63)
ANION GAP: 5 (ref 5–15)
AST: 98 U/L — AB (ref 15–41)
Alkaline Phosphatase: 53 U/L (ref 38–126)
BILIRUBIN TOTAL: 0.6 mg/dL (ref 0.3–1.2)
BUN: 18 mg/dL (ref 6–20)
CHLORIDE: 110 mmol/L (ref 101–111)
CO2: 23 mmol/L (ref 22–32)
Calcium: 8.2 mg/dL — ABNORMAL LOW (ref 8.9–10.3)
Creatinine, Ser: 1.17 mg/dL (ref 0.61–1.24)
GFR calc Af Amer: >60 mL/min (ref >60)
GFR calc non Af Amer: >60 mL/min (ref >60)
GLUCOSE: 96 mg/dL (ref 65–99)
POTASSIUM: 4 mmol/L (ref 3.5–5.1)
SODIUM: 138 mmol/L (ref 135–145)
TOTAL PROTEIN: 5.1 g/dL — AB (ref 6.5–8.1)

## 2016-09-06 LAB — CBC
HEMATOCRIT: 37.7 % — AB (ref 39.0–52.0)
HEMOGLOBIN: 12.2 g/dL — AB (ref 13.0–17.0)
MCH: 31.2 pg (ref 26.0–34.0)
MCHC: 32.4 g/dL (ref 30.0–36.0)
MCV: 96.4 fL (ref 78.0–100.0)
PLATELETS: 138 10*3/uL — AB (ref 150–400)
RBC: 3.91 MIL/uL — ABNORMAL LOW (ref 4.22–5.81)
RDW: 13.1 % (ref 11.5–15.5)
WBC: 12.8 10*3/uL — ABNORMAL HIGH (ref 4.0–10.5)

## 2016-09-06 NOTE — Evaluation (Signed)
Physical Therapy Evaluation/ Discharge Patient Details Name: Joel Sheppard MRN: [id number] DOB: 03-04-1971 Today's Date: 09/06/2016   History of Present Illness  45 yo admitted after being found unconscious and pale by girlfriend after using cocaine and heroine. PMHx: hep C, substance abuse  Clinical Impression  Pt very pleasant moving well and no reports of dizziness, SOB, CP or difficulty with balance or mobility. Pt does remodeling for a living and is very active at baseline. Pt does not recall events leading to admission and admits no substance use for 7 years until this week. Pt encouraged to abstain from drugs and maintain active lifestyle. Pt without deficits or current therapy needs and safe for D/C. Will sign off pt aware and agreeable.  HR 92-101 with activity    Follow Up Recommendations No PT follow up    Equipment Recommendations  None recommended by PT    Recommendations for Other Services       Precautions / Restrictions Precautions Precautions: None      Mobility  Bed Mobility Overal bed mobility: Independent                Transfers Overall transfer level: Independent                  Ambulation/Gait Ambulation/Gait assistance: Independent Ambulation Distance (Feet): 650 Feet Assistive device: None Gait Pattern/deviations: WFL(Within Functional Limits)   Gait velocity interpretation: at or above normal speed for age/gender    Stairs Stairs: Yes Stairs assistance: Independent Stair Management: Alternating pattern;Forwards Number of Stairs: 10    Wheelchair Mobility    Modified Rankin (Stroke Patients Only)       Balance Overall balance assessment: No apparent balance deficits (not formally assessed)                                           Pertinent Vitals/Pain Pain Assessment: No/denies pain    Home Living Family/patient expects to be discharged to:: Private residence Living Arrangements:  Spouse/significant other Available Help at Discharge: Friend(s);Available 24 hours/day Type of Home: Apartment Home Access: Stairs to enter;Elevator   Entrance Stairs-Number of Steps: 14 Home Layout: One level Home Equipment: None      Prior Function Level of Independence: Independent               Hand Dominance        Extremity/Trunk Assessment   Upper Extremity Assessment: Overall WFL for tasks assessed           Lower Extremity Assessment: Overall WFL for tasks assessed      Cervical / Trunk Assessment: Normal  Communication   Communication: No difficulties  Cognition Arousal/Alertness: Awake/alert Behavior During Therapy: WFL for tasks assessed/performed Overall Cognitive Status: Within Functional Limits for tasks assessed                      General Comments      Exercises     Assessment/Plan    PT Assessment Patent does not need any further PT services  PT Problem List            PT Treatment Interventions      PT Goals (Current goals can be found in the Care Plan section)  Acute Rehab PT Goals PT Goal Formulation: All assessment and education complete, DC therapy    Frequency     Barriers to  discharge        Co-evaluation               End of Session   Activity Tolerance: Patient tolerated treatment well Patient left: in bed;with call bell/phone within reach;with family/visitor present Nurse Communication: Mobility status         Time: [id number] PT Time Calculation (min) (ACUTE ONLY): 11 min   Charges:   PT Evaluation $PT Eval Low Complexity: 1 Procedure     PT G CodesDelorse Lek:        Tabor, Raegan Winders Beth 09/06/2016, 8:52 AM Delaney MeigsMaija Tabor Girard Koontz, PT [phone]

## 2016-09-06 NOTE — Progress Notes (Signed)
Pt. Left with out receiving his discharge paper work

## 2016-09-06 NOTE — Progress Notes (Signed)
   Subjective: Patient was feeling better, alert and oriented back to his baseline. No complaints. Patient left the hospital premises before we can give him a official discharge papers.  Objective:  Vital signs in last 24 hours: Vitals:   09/05/16 2300 09/06/16 0014 09/06/16 0430 09/06/16 0850  BP: 110/82 119/76 108/63   Pulse: 81 86 77 91  Resp: 15 16 20    Temp:  98.6 F (37 C) 98.2 F (36.8 C)   TempSrc:  Oral Oral   SpO2: 97% 95% (!) 89%   Weight:  198 lb (89.8 kg) 196 lb 9.6 oz (89.2 kg)   Height:  5\' 9"  (1.753 m)     Gen. well built, well-nourished, in no acute distress. Lungs. Clear bilaterally CV. Regular rate and rhythm Abdomen. Soft, nontender, bowel sounds positive Extremities. No edema, no cyanosis, pulses 2+ bilaterally.  Assessment/Plan:  Mr. Susa DayMcLemore is a 45 yo male with PMHx of Anxiety and Tobacco Abuse who presents from home after his girlfriend found him unresponsive.   Heroin Overdose: He was alert and oriented, back to his baseline . We counseled him on opioid use , patient seemed understanding .he states that he was sober for 7 years and he wants to stay sober.that was a one-time mistake he made.we also advise him to keep narcan with him .  Anion Gapped Metabolic Acidosis.Ressolved   Acute Renal Failure.this resolved after IV fluid resuscitation.  H/o Hepatitis C s/p Harvoni Treatment. He is still having elevated liver enzymes. His results for hep C viral load are pending. -He needs to be followed up as an outpatient. Patient left before we can give him a official discharge papers, we did talk with him regarding his elevated liver enzymes and ask him to follow-up with his PCP. He do not have a current PCP and was not interested to be seen at our clinic.   Dispo: Being discharged today.   Arnetha CourserSumayya Rane Dumm, MD 09/06/2016, 5:46 PM Pager: [phone]

## 2016-09-06 NOTE — Discharge Summary (Signed)
Name: Joel Sheppard MRN: [id number] DOB: 03-Nov-1971 45 y.o. PCP: No primary care provider on file.  Date of Admission: 09/05/2016 11:57 AM Date of Discharge: 09/06/2016 Attending Physician: No att. providers found  Discharge Diagnosis: 1. Heroin Overdose 2.H/o Hepatitis C s/p Harvoni Treatment.  Discharge Medications:   Medication List    TAKE these medications   ibuprofen 200 MG tablet Commonly known as:  ADVIL,MOTRIN Take 200-400 mg by mouth every 6 (six) hours as needed (for pain).   propranolol 10 MG tablet Commonly known as:  INDERAL Take 10 mg by mouth 3 (three) times daily as needed.   QUEtiapine 300 MG tablet Commonly known as:  SEROQUEL Take 300 mg by mouth at bedtime.       Disposition and follow-up:   JoelJoel Sheppard was [facility] from [facility] in Good condition.  At the hospital follow up visit please address:  1.  If he continue with IV drug abuse.  2.  Labs / imaging needed at time of follow-up:None  3.  Pending labs/ test needing follow-up: None  Follow-up Appointments:   Hospital Course by problem list: Joel Sheppard is a 45 yo male with PMHx of Anxiety and Tobacco Abuse who presents from home after his girlfriend found him unresponsive.   Heroin Overdose. A night before patient and his girlfriend used cocaine ($60) and heroin ($40 worth) from 11 pm to 5 am.When the girlfriend woke up next morning around 10:30 AM ,she realized she couldn't wake him up. She noted he had decreased breathing, was turning blue and was cold, and had spit surrounding his mouth and on the floor. She noted that he was making gargling noises and she couldn't role him over after about 20 minutes of this, his girlfriend called 911. She had Narcan, but did not know how to use it. Joel Sheppard relapsed after 7 years of sobriety with injecting drug use and overdosed yesterday. EMS notes indicate that he was in "purple from the nipples up" and breathing only  4-6 times per minute. He became more alert with increased respiratory rate after receiving naloxone. His urine drug screen was positive for cocaine but negative for opiates. He was slightly hypoxic overnight but has O2 saturations of 99% on room air next morning.  On admission he was found to have a 9 Metabolic acidosis, with hyperkalemia and increase in his creatinine. All of these resolved after fluid resuscitation. He is completely alert and feels like he is back to baseline.  H/o Hepatitis C s/p Harvoni Treatment.  according to his girlfriend he was treated for hep C while he was in prison. He was discharged from prison in our December 2016. His liver enzymes are elevated. His hep C antibody is positive, but hep C RNA is negative. He needs to see a PCP for follow-up of his liver dysfunction. He do not have a current PCP and was not interested to be seen at our clinic.  Discharge Vitals:   BP 108/63 (BP Location: Left Wrist)   Pulse 91   Temp 98.2 F (36.8 C) (Oral)   Resp 20   Ht 5\' 9"  (1.753 m)   Wt 196 lb 9.6 oz (89.2 kg)   SpO2 (!) 89%   BMI 29.03 kg/m   Gen. well built, well-nourished, in no acute distress. Lungs. Clear bilaterally CV. Regular rate and rhythm Abdomen. Soft, nontender, bowel sounds positive Extremities. No edema, no cyanosis, pulses 2+ bilaterally.  Pertinent Labs, Studies, and Procedures:  CBC Latest Ref Rng & Units 09/06/2016 09/05/2016  WBC 4.0 - 10.5 K/uL 12.8(H) 17.3(H)  Hemoglobin 13.0 - 17.0 g/dL 12.2(L) 16.0  Hematocrit 39.0 - 52.0 % 37.7(L) 51.8  Platelets 150 - 400 K/uL 138(L) 276   CMP Latest Ref Rng & Units 09/06/2016 09/05/2016 09/05/2016  Glucose 65 - 99 mg/dL 96 161(W110(H) 85  BUN 6 - 20 mg/dL 18 96(E22(H) 45(W22(H)  Creatinine 0.61 - 1.24 mg/dL 0.981.17 1.19(J1.45(H) 4.78(G1.71(H)  Sodium 135 - 145 mmol/L 138 138 140  Potassium 3.5 - 5.1 mmol/L 4.0 4.2 4.8  Chloride 101 - 111 mmol/L 110 107 110  CO2 22 - 32 mmol/L 23 24 20(L)  Calcium 8.9 - 10.3 mg/dL 8.2(L) 8.0(L)  7.8(L)  Total Protein 6.5 - 8.1 g/dL 5.1(L) - -  Total Bilirubin 0.3 - 1.2 mg/dL 0.6 - -  Alkaline Phos 38 - 126 U/L 53 - -  AST 15 - 41 U/L 98(H) - -  ALT 17 - 63 U/L 93(H) - -   Hep C AB.  Positive Hep C RNA. Not detected.  ABG    Component Value Date/Time   PHART 7.331 (L) 09/05/2016 2005   PCO2ART 40.5 09/05/2016 2005   PO2ART 58.0 (L) 09/05/2016 2005   HCO3 21.5 09/05/2016 2005   TCO2 23 09/05/2016 2005   ACIDBASEDEF 4.0 (H) 09/05/2016 2005   O2SAT 88.0 09/05/2016 2005   Drugs of Abuse     Component Value Date/Time   LABOPIA NONE DETECTED 09/05/2016 1416   COCAINSCRNUR POSITIVE (A) 09/05/2016 1416   LABBENZ NONE DETECTED 09/05/2016 1416   AMPHETMU NONE DETECTED 09/05/2016 1416   THCU NONE DETECTED 09/05/2016 1416   LABBARB NONE DETECTED 09/05/2016 1416    Discharge Instructions: Discharge Instructions    Diet - low sodium heart healthy    Complete by:  As directed    Increase activity slowly    Complete by:  As directed       Signed: Arnetha CourserSumayya Shamari Lofquist, MD 09/06/2016, 5:45 PM   Pager: [phone]

## 2016-09-07 ENCOUNTER — Encounter: Payer: Self-pay | Admitting: Internal Medicine

## 2016-09-07 DIAGNOSIS — R768 Other specified abnormal immunological findings in serum: Secondary | ICD-10-CM | POA: Insufficient documentation

## 2016-09-07 LAB — HEPATITIS C ANTIBODY: HCV Ab: >11 s/co ratio — ABNORMAL HIGH (ref 0.0–0.9)

## 2016-09-07 LAB — HCV RNA QUANT: HCV Quantitative: NOT DETECTED IU/mL (ref >50)

## 2016-09-07 LAB — HIV ANTIBODY (ROUTINE TESTING W REFLEX): HIV SCREEN 4TH GENERATION: NONREACTIVE

## 2016-10-10 ENCOUNTER — Emergency Department (HOSPITAL_COMMUNITY): Payer: Medicaid Other

## 2016-10-10 ENCOUNTER — Emergency Department (HOSPITAL_COMMUNITY)
Admission: EM | Admit: 2016-10-10 | Discharge: 2016-10-10 | Disposition: A | Discharge status: Home / Self Care | Payer: Medicaid Other | Attending: Emergency Medicine | Admitting: Emergency Medicine

## 2016-10-10 ENCOUNTER — Encounter (HOSPITAL_COMMUNITY): Payer: Self-pay | Admitting: Emergency Medicine

## 2016-10-10 DIAGNOSIS — Z79899 Other long term (current) drug therapy: Secondary | ICD-10-CM | POA: Insufficient documentation

## 2016-10-10 DIAGNOSIS — F17210 Nicotine dependence, cigarettes, uncomplicated: Secondary | ICD-10-CM | POA: Diagnosis not present

## 2016-10-10 DIAGNOSIS — M79672 Pain in left foot: Secondary | ICD-10-CM | POA: Diagnosis not present

## 2016-10-10 LAB — CBC WITH DIFFERENTIAL/PLATELET
BASOS ABS: 0 10*3/uL (ref 0.0–0.1)
Basophils Relative: 0 %
EOS PCT: 0 %
Eosinophils Absolute: 0 10*3/uL (ref 0.0–0.7)
HEMATOCRIT: 33.7 % — AB (ref 39.0–52.0)
Hemoglobin: 11.8 g/dL — ABNORMAL LOW (ref 13.0–17.0)
LYMPHS PCT: 8 %
Lymphs Abs: 0.9 10*3/uL (ref 0.7–4.0)
MCH: 29.7 pg (ref 26.0–34.0)
MCHC: 35 g/dL (ref 30.0–36.0)
MCV: 84.9 fL (ref 78.0–100.0)
MONO ABS: 1.3 10*3/uL — AB (ref 0.1–1.0)
MONOS PCT: 12 %
Neutro Abs: 8.7 10*3/uL — ABNORMAL HIGH (ref 1.7–7.7)
Neutrophils Relative %: 80 %
PLATELETS: 133 10*3/uL — AB (ref 150–400)
RBC: 3.97 MIL/uL — ABNORMAL LOW (ref 4.22–5.81)
RDW: 12.9 % (ref 11.5–15.5)
WBC: 10.9 10*3/uL — ABNORMAL HIGH (ref 4.0–10.5)

## 2016-10-10 LAB — BASIC METABOLIC PANEL
ANION GAP: 8 (ref 5–15)
BUN: 14 mg/dL (ref 6–20)
CALCIUM: 8.2 mg/dL — AB (ref 8.9–10.3)
CO2: 24 mmol/L (ref 22–32)
CREATININE: 0.81 mg/dL (ref 0.61–1.24)
Chloride: 91 mmol/L — ABNORMAL LOW (ref 101–111)
GFR calc Af Amer: >60 mL/min (ref >60)
GLUCOSE: 124 mg/dL — AB (ref 65–99)
Potassium: 4 mmol/L (ref 3.5–5.1)
Sodium: 123 mmol/L — ABNORMAL LOW (ref 135–145)

## 2016-10-10 LAB — SEDIMENTATION RATE: Sed Rate: 24 mm/hr — ABNORMAL HIGH (ref 0–16)

## 2016-10-10 MED ORDER — SULFAMETHOXAZOLE-TRIMETHOPRIM 800-160 MG PO TABS: 1.0000 | ORAL_TABLET | Freq: Two times a day (BID) | ORAL | 0 refills | Status: AC

## 2016-10-10 MED ORDER — HYDROMORPHONE HCL 1 MG/ML IJ SOLN
1.0000 mg | Freq: Once | INTRAMUSCULAR | Status: DC
Start: 2016-10-10 — End: 2016-10-10

## 2016-10-10 NOTE — ED Provider Notes (Signed)
Pt seen and evaluated with PA-C.  Errythema over tibial tuburcle.  No Knee joint effusion. Errythema to mid foot, not to MCP, thus doubt septic joint or gout.  Slight Leukocytosis and severe 24. Not febrile. Not tachycardic. Has no murmur. Plan will be outpatient antibiotics anti-inflammatories recheck if not improving.   Rolland PorterMark Jaden, MD 10/10/16 [phone]

## 2016-10-10 NOTE — Discharge Instructions (Signed)
Please take antibiotic for possible infection Take aleve three times a day for inflammation and pain Return to ED if you develop fevers, chills, or you are unable to move your ankle or knee joints

## 2016-10-10 NOTE — ED Provider Notes (Signed)
[facility]-EMERGENCY DEPT Provider Note   CSN: [id number] Arrival date & time: 10/10/16  1257     History   Chief Complaint Chief Complaint  Patient presents with  . Joint Pain    HPI Joel Sheppard is a 45 y.o. male with pertinent pmh of IVD use (cocaine, heroine) and recent heroin overdose (09/05/2016) presents to ED today with acute low back pain, L dorsal foot and R anterior knee pain that started last night. Pt states pain is achy, constant and worsened with movement and walking.  Pt has been using IVD daily, multiple times a day and injects in bilateral arms and hands.  He last used 1/2 gram of heroin and cocaine this morning.  Pt denies history of gout, fevers, chills, productive cough, chest pain, shortness of breath, recent trauma to L foot and R knee.  Pt had a tib fib fracture of L ankle <15 years ago, has screws and plates in that joint.     HPI  History reviewed. No pertinent past medical history.  Patient Active Problem List   Diagnosis Date Noted  . Hepatitis C antibody test positive 09/07/2016  . Heroin overdose 09/05/2016  . Hyperkalemia 09/05/2016  . High anion gap metabolic acidosis 09/05/2016  . Elevated liver enzymes 09/05/2016  . Acute renal failure (ARF) (HCC) 09/05/2016  . Drug abuse 09/05/2016  . Drug overdose, accidental or unintentional, initial encounter 09/05/2016    Past Surgical History:  Procedure Laterality Date  . LEG SURGERY         Home Medications    Prior to Admission medications   Medication Sig Start Date End Date Taking? Authorizing Provider  QUEtiapine (SEROQUEL) 300 MG tablet Take 300 mg by mouth at bedtime.   Yes Historical Provider, MD  ibuprofen (ADVIL,MOTRIN) 200 MG tablet Take 200-400 mg by mouth every 6 (six) hours as needed (for pain).    Historical Provider, MD  propranolol (INDERAL) 10 MG tablet Take 10 mg by mouth 3 (three) times daily as needed (anxiety).     Historical Provider, MD  sulfamethoxazole-trimethoprim  (BACTRIM DS,SEPTRA DS) 800-160 MG tablet Take 1 tablet by mouth 2 (two) times daily. 10/10/16 10/17/16  Liberty Handylaudia J Shantelle Alles, PA-C    Family History History reviewed. No pertinent family history.  Social History Social History  Substance Use Topics  . Smoking status: Current Every Day Smoker    Packs/day: 1.00    Types: Cigarettes  . Smokeless tobacco: Never Used  . Alcohol use No     Allergies   Patient has no known allergies.   Review of Systems Review of Systems  Constitutional: Negative for chills, fatigue and fever.  HENT: Negative for congestion and sore throat.   Eyes: Negative for visual disturbance.  Respiratory: Negative for cough, chest tightness and shortness of breath.   Cardiovascular: Negative for chest pain.  Gastrointestinal: Negative for abdominal pain, constipation, diarrhea, nausea and vomiting.  Genitourinary: Negative for dysuria, enuresis and flank pain.       Denies groin numbness, urine retention or incontinence.  Musculoskeletal: Positive for back pain and joint swelling (anterior R knee, L dorsal foot). Negative for neck pain and neck stiffness.  Skin: Positive for color change (redness on R knee and L foot).  Neurological: Negative for weakness, numbness and headaches.  Psychiatric/Behavioral: Negative.      Physical Exam Updated Vital Signs BP 118/77   Pulse 101   Temp 98.6 F (37 C) (Oral)   Resp 16   Ht 5\' 9"  (  1.753 m)   Wt 75.8 kg   SpO2 98%   BMI 24.66 kg/m   Physical Exam  Constitutional: He appears well-developed and well-nourished. No distress.  HENT:  Head: Normocephalic.  Mouth/Throat: Oropharynx is clear and moist. No oropharyngeal exudate.  Eyes: Conjunctivae are normal. Pupils are equal, round, and reactive to light.  Neck: Neck supple.  Cardiovascular: Normal rate, regular rhythm, normal heart sounds and intact distal pulses.   No murmur heard. Pulmonary/Chest: Effort normal and breath sounds normal. No respiratory  distress. He has no wheezes.  Abdominal: Soft. Bowel sounds are normal. There is no tenderness.  Musculoskeletal:  Erythema, warmth, tenderness and edema over L dorsal midfoot and over R tibial tuberosity most consistent with cellulitis.  No signs of effusion or fluctuance over L ankle joint, L big toe MCP, or R knee.  No crepitus palpated with active ROM at lower extremities.  Full ROM and strength of lower extremities and joints, bilaterally.  Tenderness most pronounced at dorsal midfoot and not over big toe MCP.  Distal pulses strong in lower extremities.  Light touch sensation intact in bilateral lower extremities.    Lymphadenopathy:    He has no cervical adenopathy.     ED Treatments / Results  Labs (all labs ordered are listed, but only abnormal results are displayed) Labs Reviewed  CBC WITH DIFFERENTIAL/PLATELET - Abnormal; Notable for the following:       Result Value   WBC 10.9 (*)    RBC 3.97 (*)    Hemoglobin 11.8 (*)    HCT 33.7 (*)    Platelets 133 (*)    Neutro Abs 8.7 (*)    Monocytes Absolute 1.3 (*)    All other components within normal limits  BASIC METABOLIC PANEL - Abnormal; Notable for the following:    Sodium 123 (*)    Chloride 91 (*)    Glucose, Bld 124 (*)    Calcium 8.2 (*)    All other components within normal limits  SEDIMENTATION RATE - Abnormal; Notable for the following:    Sed Rate 24 (*)    All other components within normal limits    EKG  EKG Interpretation None       Radiology Dg Knee Complete 4 Views Right  Result Date: 10/10/2016 CLINICAL DATA:  45 year old male with a history of right knee and left foot pain EXAM: RIGHT KNEE - COMPLETE 4+ VIEW COMPARISON:  None. FINDINGS: No acute fracture. No focal soft tissue swelling. No joint effusion. No radiopaque foreign body. Minimal degenerative changes. IMPRESSION: Negative for acute bony abnormality. Signed, Yvone Neu. Loreta Ave, DO [facility]  Radiology Electronically Signed   By: Gilmer Mor D.O.   On: 10/10/2016 15:35   Dg Foot Complete Left  Result Date: 10/10/2016 CLINICAL DATA:  Edema of the left foot EXAM: LEFT FOOT - COMPLETE 3+ VIEW COMPARISON:  None. FINDINGS: Hardware for fixation of previous medial and lateral malleolar fractures is noted. No acute abnormality is seen. Tarsal -metatarsal alignment is normal. Joint spaces appear normal. IMPRESSION: No acute abnormality. Prior fixation of medial and lateral malleolar fractures. Electronically Signed   By: Dwyane Dee M.D.   On: 10/10/2016 15:36    Procedures Procedures (including critical care time)  Medications Ordered in ED Medications - No data to display   Initial Impression / Assessment and Plan / ED Course  I have reviewed the triage vital signs and the nursing notes.  Pertinent labs & imaging results  that were available during my care of the patient were reviewed by me and considered in my medical decision making (see chart for details).  Clinical Course    45 yo male with history of daily and current IVDU and recent heroin overdose presents with L foot and R knee pain, redness and swelling.  Pt states he injects in bilateral arms and hands, not in lower extremities.  No fevers, numbness or tingling of lower extremities, no groin numbness or bladder retention or incontinence.  Exam positive for R tibial tuberosity erythema and mild edema and L dorsal midfoot edema, erythema and warmth.  Full ROM of L ankle and R knee, no fluctuance or effusion noted at these joints.  X-rays of R knee and L ankle negative for acute fractures or acute bony abnormalities.   Mildly elevated WBC at 10.9 with sed red at 24.  Ddx includes cellulitis, septic arthritis, gout.  Presentation and work up most consistent with cellulitis; with septic arthritis or gout being less likely since pt is not acutely tender over knee/ankle joint but more so over soft tissue of mid foot and tibial  tuberosity.  Pt with normal ROM at affected joints, afebrile, non tachycardic without heart murmur.  Pt will be discharged with bactrim and aleve to take for pain and inflammation.  Pt discussed with Dr. Fayrene FearingJames who assisted in MDM and dispo.  Final Clinical Impressions(s) / ED Diagnoses   Final diagnoses:  Foot pain, left    New Prescriptions Discharge Medication List as of 10/10/2016  5:54 PM    START taking these medications   Details  sulfamethoxazole-trimethoprim (BACTRIM DS,SEPTRA DS) 800-160 MG tablet Take 1 tablet by mouth 2 (two) times daily., Starting Fri 10/10/2016, Until Fri 10/17/2016, Print         Liberty Handylaudia J Jaquin Coy, PA-C 10/10/16 2027    Rolland PorterMark Kyren, MD 10/29/16 1020

## 2016-10-10 NOTE — ED Triage Notes (Addendum)
Patient reports back pain which began yesterday.  States he began to hurt in his right knee this morning and currently "aches all over".  Reports he believes he has a staph infection in his joints from IV drug use recently.  States last IV drug use this morning (heroin, cocaine)

## 2016-10-13 ENCOUNTER — Telehealth (HOSPITAL_BASED_OUTPATIENT_CLINIC_OR_DEPARTMENT_OTHER): Payer: Self-pay | Admitting: Emergency Medicine

## 2016-10-21 ENCOUNTER — Encounter (HOSPITAL_COMMUNITY): Payer: Self-pay | Admitting: *Deleted

## 2016-10-21 ENCOUNTER — Emergency Department (HOSPITAL_COMMUNITY): Payer: Medicaid Other

## 2016-10-21 ENCOUNTER — Inpatient Hospital Stay (HOSPITAL_COMMUNITY)
Admission: EM | Admit: 2016-10-21 | Discharge: 2016-12-01 | DRG: 871 | Disposition: E | Payer: Medicaid Other | Attending: Pulmonary Disease | Admitting: Pulmonary Disease

## 2016-10-21 DIAGNOSIS — L03115 Cellulitis of right lower limb: Secondary | ICD-10-CM | POA: Diagnosis present

## 2016-10-21 DIAGNOSIS — R739 Hyperglycemia, unspecified: Secondary | ICD-10-CM | POA: Diagnosis present

## 2016-10-21 DIAGNOSIS — F439 Reaction to severe stress, unspecified: Secondary | ICD-10-CM | POA: Diagnosis present

## 2016-10-21 DIAGNOSIS — E46 Unspecified protein-calorie malnutrition: Secondary | ICD-10-CM | POA: Diagnosis present

## 2016-10-21 DIAGNOSIS — Z4659 Encounter for fitting and adjustment of other gastrointestinal appliance and device: Secondary | ICD-10-CM

## 2016-10-21 DIAGNOSIS — D649 Anemia, unspecified: Secondary | ICD-10-CM | POA: Diagnosis present

## 2016-10-21 DIAGNOSIS — Z806 Family history of leukemia: Secondary | ICD-10-CM

## 2016-10-21 DIAGNOSIS — E860 Dehydration: Secondary | ICD-10-CM | POA: Diagnosis present

## 2016-10-21 DIAGNOSIS — R6521 Severe sepsis with septic shock: Secondary | ICD-10-CM | POA: Diagnosis not present

## 2016-10-21 DIAGNOSIS — E875 Hyperkalemia: Secondary | ICD-10-CM | POA: Diagnosis present

## 2016-10-21 DIAGNOSIS — D638 Anemia in other chronic diseases classified elsewhere: Secondary | ICD-10-CM | POA: Diagnosis present

## 2016-10-21 DIAGNOSIS — I079 Rheumatic tricuspid valve disease, unspecified: Secondary | ICD-10-CM

## 2016-10-21 DIAGNOSIS — F1990 Other psychoactive substance use, unspecified, uncomplicated: Secondary | ICD-10-CM

## 2016-10-21 DIAGNOSIS — F1910 Other psychoactive substance abuse, uncomplicated: Secondary | ICD-10-CM | POA: Diagnosis present

## 2016-10-21 DIAGNOSIS — E872 Acidosis: Secondary | ICD-10-CM | POA: Diagnosis present

## 2016-10-21 DIAGNOSIS — N179 Acute kidney failure, unspecified: Secondary | ICD-10-CM | POA: Diagnosis present

## 2016-10-21 DIAGNOSIS — I330 Acute and subacute infective endocarditis: Secondary | ICD-10-CM | POA: Diagnosis present

## 2016-10-21 DIAGNOSIS — R6883 Chills (without fever): Secondary | ICD-10-CM | POA: Diagnosis present

## 2016-10-21 DIAGNOSIS — B182 Chronic viral hepatitis C: Secondary | ICD-10-CM | POA: Diagnosis present

## 2016-10-21 DIAGNOSIS — J810 Acute pulmonary edema: Secondary | ICD-10-CM | POA: Diagnosis not present

## 2016-10-21 DIAGNOSIS — Z86711 Personal history of pulmonary embolism: Secondary | ICD-10-CM | POA: Diagnosis not present

## 2016-10-21 DIAGNOSIS — Z720 Tobacco use: Secondary | ICD-10-CM | POA: Diagnosis present

## 2016-10-21 DIAGNOSIS — Z515 Encounter for palliative care: Secondary | ICD-10-CM | POA: Diagnosis not present

## 2016-10-21 DIAGNOSIS — I082 Rheumatic disorders of both aortic and tricuspid valves: Secondary | ICD-10-CM | POA: Diagnosis present

## 2016-10-21 DIAGNOSIS — L02619 Cutaneous abscess of unspecified foot: Secondary | ICD-10-CM

## 2016-10-21 DIAGNOSIS — L02416 Cutaneous abscess of left lower limb: Secondary | ICD-10-CM | POA: Diagnosis present

## 2016-10-21 DIAGNOSIS — A4101 Sepsis due to Methicillin susceptible Staphylococcus aureus: Secondary | ICD-10-CM

## 2016-10-21 DIAGNOSIS — Z66 Do not resuscitate: Secondary | ICD-10-CM | POA: Diagnosis not present

## 2016-10-21 DIAGNOSIS — Z820 Family history of epilepsy and other diseases of the nervous system: Secondary | ICD-10-CM

## 2016-10-21 DIAGNOSIS — I76 Septic arterial embolism: Secondary | ICD-10-CM | POA: Diagnosis present

## 2016-10-21 DIAGNOSIS — L03116 Cellulitis of left lower limb: Secondary | ICD-10-CM | POA: Diagnosis present

## 2016-10-21 DIAGNOSIS — J9601 Acute respiratory failure with hypoxia: Secondary | ICD-10-CM | POA: Diagnosis not present

## 2016-10-21 DIAGNOSIS — J850 Gangrene and necrosis of lung: Secondary | ICD-10-CM

## 2016-10-21 DIAGNOSIS — I351 Nonrheumatic aortic (valve) insufficiency: Secondary | ICD-10-CM

## 2016-10-21 DIAGNOSIS — Z9911 Dependence on respirator [ventilator] status: Secondary | ICD-10-CM

## 2016-10-21 DIAGNOSIS — R0600 Dyspnea, unspecified: Secondary | ICD-10-CM

## 2016-10-21 DIAGNOSIS — IMO0002 Reserved for concepts with insufficient information to code with codable children: Secondary | ICD-10-CM

## 2016-10-21 DIAGNOSIS — D6959 Other secondary thrombocytopenia: Secondary | ICD-10-CM | POA: Diagnosis present

## 2016-10-21 DIAGNOSIS — I071 Rheumatic tricuspid insufficiency: Secondary | ICD-10-CM | POA: Diagnosis present

## 2016-10-21 DIAGNOSIS — E871 Hypo-osmolality and hyponatremia: Secondary | ICD-10-CM | POA: Diagnosis present

## 2016-10-21 DIAGNOSIS — R197 Diarrhea, unspecified: Secondary | ICD-10-CM | POA: Diagnosis not present

## 2016-10-21 DIAGNOSIS — R509 Fever, unspecified: Secondary | ICD-10-CM

## 2016-10-21 DIAGNOSIS — L03119 Cellulitis of unspecified part of limb: Secondary | ICD-10-CM

## 2016-10-21 DIAGNOSIS — R570 Cardiogenic shock: Secondary | ICD-10-CM | POA: Diagnosis not present

## 2016-10-21 DIAGNOSIS — H9191 Unspecified hearing loss, right ear: Secondary | ICD-10-CM | POA: Diagnosis present

## 2016-10-21 DIAGNOSIS — I368 Other nonrheumatic tricuspid valve disorders: Secondary | ICD-10-CM

## 2016-10-21 DIAGNOSIS — M1990 Unspecified osteoarthritis, unspecified site: Secondary | ICD-10-CM | POA: Diagnosis present

## 2016-10-21 DIAGNOSIS — R768 Other specified abnormal immunological findings in serum: Secondary | ICD-10-CM | POA: Diagnosis present

## 2016-10-21 DIAGNOSIS — I358 Other nonrheumatic aortic valve disorders: Secondary | ICD-10-CM

## 2016-10-21 DIAGNOSIS — E876 Hypokalemia: Secondary | ICD-10-CM | POA: Diagnosis not present

## 2016-10-21 DIAGNOSIS — Z6822 Body mass index (BMI) 22.0-22.9, adult: Secondary | ICD-10-CM

## 2016-10-21 DIAGNOSIS — R748 Abnormal levels of other serum enzymes: Secondary | ICD-10-CM | POA: Diagnosis present

## 2016-10-21 DIAGNOSIS — F17210 Nicotine dependence, cigarettes, uncomplicated: Secondary | ICD-10-CM | POA: Diagnosis present

## 2016-10-21 DIAGNOSIS — R7881 Bacteremia: Secondary | ICD-10-CM | POA: Diagnosis present

## 2016-10-21 DIAGNOSIS — R0902 Hypoxemia: Secondary | ICD-10-CM

## 2016-10-21 DIAGNOSIS — Z79899 Other long term (current) drug therapy: Secondary | ICD-10-CM

## 2016-10-21 DIAGNOSIS — Z452 Encounter for adjustment and management of vascular access device: Secondary | ICD-10-CM

## 2016-10-21 DIAGNOSIS — E8809 Other disorders of plasma-protein metabolism, not elsewhere classified: Secondary | ICD-10-CM | POA: Diagnosis present

## 2016-10-21 DIAGNOSIS — L0390 Cellulitis, unspecified: Secondary | ICD-10-CM | POA: Diagnosis present

## 2016-10-21 DIAGNOSIS — D696 Thrombocytopenia, unspecified: Secondary | ICD-10-CM | POA: Diagnosis present

## 2016-10-21 DIAGNOSIS — Z978 Presence of other specified devices: Secondary | ICD-10-CM

## 2016-10-21 DIAGNOSIS — Z8619 Personal history of other infectious and parasitic diseases: Secondary | ICD-10-CM

## 2016-10-21 HISTORY — DX: Unspecified viral hepatitis C without hepatic coma: B19.20

## 2016-10-21 HISTORY — DX: Poisoning by heroin, accidental (unintentional), initial encounter: T40.1X1A

## 2016-10-21 HISTORY — DX: Other psychoactive substance abuse, uncomplicated: F19.10

## 2016-10-21 LAB — CBC WITH DIFFERENTIAL/PLATELET
BASOS ABS: 0 10*3/uL (ref 0.0–0.1)
BASOS PCT: 0 %
EOS ABS: 0 10*3/uL (ref 0.0–0.7)
Eosinophils Relative: 0 %
HCT: 30.9 % — ABNORMAL LOW (ref 39.0–52.0)
Hemoglobin: 10.8 g/dL — ABNORMAL LOW (ref 13.0–17.0)
LYMPHS PCT: 8 %
Lymphs Abs: 1.3 10*3/uL (ref 0.7–4.0)
MCH: 28.1 pg (ref 26.0–34.0)
MCHC: 35 g/dL (ref 30.0–36.0)
MCV: 80.5 fL (ref 78.0–100.0)
Monocytes Absolute: 1 10*3/uL (ref 0.1–1.0)
Monocytes Relative: 6 %
NEUTROS PCT: 86 %
Neutro Abs: 14.1 10*3/uL — ABNORMAL HIGH (ref 1.7–7.7)
PLATELETS: 132 10*3/uL — AB (ref 150–400)
RBC: 3.84 MIL/uL — AB (ref 4.22–5.81)
RDW: 14.6 % (ref 11.5–15.5)
WBC: 16.4 10*3/uL — ABNORMAL HIGH (ref 4.0–10.5)

## 2016-10-21 LAB — COMPREHENSIVE METABOLIC PANEL
ALK PHOS: 162 U/L — AB (ref 38–126)
ALT: 83 U/L — ABNORMAL HIGH (ref 17–63)
ANION GAP: 12 (ref 5–15)
AST: 91 U/L — ABNORMAL HIGH (ref 15–41)
Albumin: 1.8 g/dL — ABNORMAL LOW (ref 3.5–5.0)
BUN: 134 mg/dL — ABNORMAL HIGH (ref 6–20)
CALCIUM: 8 mg/dL — AB (ref 8.9–10.3)
CO2: 20 mmol/L — AB (ref 22–32)
Chloride: 89 mmol/L — ABNORMAL LOW (ref 101–111)
Creatinine, Ser: 4.34 mg/dL — ABNORMAL HIGH (ref 0.61–1.24)
GFR calc non Af Amer: 15 mL/min — ABNORMAL LOW (ref >60)
GFR, EST AFRICAN AMERICAN: 18 mL/min — AB (ref >60)
Glucose, Bld: 114 mg/dL — ABNORMAL HIGH (ref 65–99)
Potassium: 5.5 mmol/L — ABNORMAL HIGH (ref 3.5–5.1)
SODIUM: 121 mmol/L — AB (ref 135–145)
TOTAL PROTEIN: 6.4 g/dL — AB (ref 6.5–8.1)
Total Bilirubin: 2.8 mg/dL — ABNORMAL HIGH (ref 0.3–1.2)

## 2016-10-21 LAB — I-STAT CG4 LACTIC ACID, ED: Lactic Acid, Venous: 1.71 mmol/L (ref 0.5–1.9)

## 2016-10-21 MED ORDER — SODIUM CHLORIDE 0.9 % IV SOLN
INTRAVENOUS | Status: AC
  Administered 2016-10-22: 01:00:00 via INTRAVENOUS

## 2016-10-21 MED ORDER — SODIUM CHLORIDE 0.9 % IV BOLUS (SEPSIS)
2000.0000 mL | Freq: Once | INTRAVENOUS | Status: AC
  Administered 2016-10-21: 2000 mL via INTRAVENOUS

## 2016-10-21 MED ORDER — PIPERACILLIN-TAZOBACTAM 3.375 G IVPB
3.3750 g | Freq: Once | INTRAVENOUS | Status: AC
  Administered 2016-10-22: 3.375 g via INTRAVENOUS
  Filled 2016-10-21: qty 50

## 2016-10-21 MED ORDER — VANCOMYCIN HCL IN DEXTROSE 1-5 GM/200ML-% IV SOLN
1000.0000 mg | Freq: Once | INTRAVENOUS | Status: AC
  Administered 2016-10-22: 1000 mg via INTRAVENOUS
  Filled 2016-10-21: qty 200

## 2016-10-21 NOTE — ED Provider Notes (Signed)
[facility]-EMERGENCY DEPT Provider Note   CSN: [id number] Arrival date & time: 08-14-2016  2008     History   Chief Complaint Chief Complaint  Patient presents with  . Leg Swelling   Patient is vague historian HPI Joel Sheppard is a 45 y.o. male.Patient has had severe burning pain in bilateral feet for the past 4 days and has been unable to get out of bed except to briefly go to the bathroom in the past 4 days. He also complains of diffuse backache. He denies any chest pain he does admit to chills however states he's been withdrawing from opioids. He last injected himself with a gray substance which he believes was fentanyl 5 PM today and he also periodically injects himself with cocaine. For pain is worse with weightbearing and improved with nonweightbearing. Patient denies chest pain nausea or vomiting. No other associated symptoms.  HPI  History reviewed. No pertinent past medical history.  Patient Active Problem List   Diagnosis Date Noted  . Hepatitis C antibody test positive 09/07/2016  . Heroin overdose 09/05/2016  . Hyperkalemia 09/05/2016  . High anion gap metabolic acidosis 09/05/2016  . Elevated liver enzymes 09/05/2016  . Acute renal failure (ARF) (HCC) 09/05/2016  . Drug abuse 09/05/2016  . Drug overdose, accidental or unintentional, initial encounter 09/05/2016    Past Surgical History:  Procedure Laterality Date  . LEG SURGERY         Home Medications    Prior to Admission medications   Medication Sig Start Date End Date Taking? Authorizing Provider  ibuprofen (ADVIL,MOTRIN) 200 MG tablet Take 200-400 mg by mouth every 6 (six) hours as needed (for pain).   Yes Historical Provider, MD  naproxen (NAPROSYN) 250 MG tablet Take 250 mg by mouth 3 (three) times daily as needed for moderate pain.   Yes Historical Provider, MD  propranolol (INDERAL) 10 MG tablet Take 10 mg by mouth 3 (three) times daily as needed (anxiety).     Historical Provider, MD  QUEtiapine  (SEROQUEL) 300 MG tablet Take 300 mg by mouth at bedtime.    Historical Provider, MD    Family History No family history on file.  Social History Social History  Substance Use Topics  . Smoking status: Current Every Day Smoker    Packs/day: 1.00    Types: Cigarettes  . Smokeless tobacco: Never Used  . Alcohol use No   Admits to IV drug use  Allergies   Patient has no known allergies.   Review of Systems Review of Systems  Constitutional: Negative.   HENT: Negative.   Respiratory: Negative.   Cardiovascular: Positive for chest pain.       Syncope  Gastrointestinal: Negative.   Musculoskeletal: Positive for arthralgias and back pain.       Bilateral foot pain  Skin: Negative.   Allergic/Immunologic: Negative.   Neurological: Negative.   Psychiatric/Behavioral: Negative.   All other systems reviewed and are negative.    Physical Exam Updated Vital Signs BP (!) 87/47   Pulse 81   Temp 97.5 F (36.4 C) (Oral)   Resp 22   SpO2 100%   Physical Exam  Constitutional: He is oriented to person, place, and time. He appears distressed.  Chronically and acutely ill-appearing  HENT:  Head: Normocephalic and atraumatic.  Mucous membranes dry  poor dentition  Eyes: Conjunctivae are normal. Pupils are equal, round, and reactive to light.  Neck: Neck supple. No tracheal deviation present. No thyromegaly present.  Cardiovascular: Normal rate  and regular rhythm.   No murmur heard. Pulmonary/Chest: Effort normal and breath sounds normal.  Abdominal: Soft. Bowel sounds are normal. He exhibits no distension. There is no tenderness.  Musculoskeletal: Normal range of motion. He exhibits no edema or tenderness.  Bilateral lower extremities with pedal edema. Left lower extremity with purplish raised area at dorsal lateral aspect of foot approximately 20 cm x 5 cm. DP pulse 2+. Right lower extremity with purplish raised area at dorsum of foot approximately 10 cm x 5 cm DP pulse 2+.  Right upper extremity hand mildly diffusely swollen. Radial pulse 2+ good capillary refill left upper extremity without redness swelling or tenderness neurovascularly intact. Radial pulse 2+. Entire spine is nontender. He does diffuse have back pain when changing positions in bed  Neurological: He is alert and oriented to person, place, and time. Coordination normal.  Skin: Skin is warm and dry. No rash noted.  Psychiatric: He has a normal mood and affect.  Nursing note and vitals reviewed.    ED Treatments / Results  Labs (all labs ordered are listed, but only abnormal results are displayed) Labs Reviewed  COMPREHENSIVE METABOLIC PANEL - Abnormal; Notable for the following:       Result Value   Sodium 121 (*)    Potassium 5.5 (*)    Chloride 89 (*)    CO2 20 (*)    Glucose, Bld 114 (*)    BUN 134 (*)    Creatinine, Ser 4.34 (*)    Calcium 8.0 (*)    Total Protein 6.4 (*)    Albumin 1.8 (*)    AST 91 (*)    ALT 83 (*)    Alkaline Phosphatase 162 (*)    Total Bilirubin 2.8 (*)    GFR calc non Af Amer 15 (*)    GFR calc Af Amer 18 (*)    All other components within normal limits  CBC WITH DIFFERENTIAL/PLATELET - Abnormal; Notable for the following:    WBC 16.4 (*)    RBC 3.84 (*)    Hemoglobin 10.8 (*)    HCT 30.9 (*)    Platelets 132 (*)    Neutro Abs 14.1 (*)    All other components within normal limits  CULTURE, BLOOD (ROUTINE X 2)  CULTURE, BLOOD (ROUTINE X 2)  URINALYSIS, ROUTINE W REFLEX MICROSCOPIC (NOT AT [facility])  CK  I-STAT CG4 LACTIC ACID, ED    EKG  EKG Interpretation  Date/Time:  Tuesday October 21 2016 23:38:54 EST Ventricular Rate:  81 PR Interval:    QRS Duration: 111 QT Interval:  366 QTC Calculation: 425 R Axis:   100 Text Interpretation:  Sinus rhythm Consider right atrial enlargement Probable RVH w/ secondary repol abnormality Baseline wander in lead(s) V2 No significant change since last tracing Confirmed by Ethelda Chick  MD, Cliff Damiani [phone]) on  10/19/2016 11:52:17 PM     X-rays viewed by me Results for orders placed or performed during the hospital encounter of 10/13/2016  Comprehensive metabolic panel  Result Value Ref Range   Sodium 121 (L) 135 - 145 mmol/L   Potassium 5.5 (H) 3.5 - 5.1 mmol/L   Chloride 89 (L) 101 - 111 mmol/L   CO2 20 (L) 22 - 32 mmol/L   Glucose, Bld 114 (H) 65 - 99 mg/dL   BUN 604 (H) 6 - 20 mg/dL   Creatinine, Ser 5.40 (H) 0.61 - 1.24 mg/dL   Calcium 8.0 (L) 8.9 - 10.3 mg/dL   Total Protein 6.4 (L)  6.5 - 8.1 g/dL   Albumin 1.8 (L) 3.5 - 5.0 g/dL   AST 91 (H) 15 - 41 U/L   ALT 83 (H) 17 - 63 U/L   Alkaline Phosphatase 162 (H) 38 - 126 U/L   Total Bilirubin 2.8 (H) 0.3 - 1.2 mg/dL   GFR calc non Af Amer 15 (L) >60 mL/min   GFR calc Af Amer 18 (L) >60 mL/min   Anion gap 12 5 - 15  CBC with Differential  Result Value Ref Range   WBC 16.4 (H) 4.0 - 10.5 K/uL   RBC 3.84 (L) 4.22 - 5.81 MIL/uL   Hemoglobin 10.8 (L) 13.0 - 17.0 g/dL   HCT 29.530.9 (L) 62.139.0 - 30.852.0 %   MCV 80.5 78.0 - 100.0 fL   MCH 28.1 26.0 - 34.0 pg   MCHC 35.0 30.0 - 36.0 g/dL   RDW 65.714.6 84.611.5 - 96.215.5 %   Platelets 132 (L) 150 - 400 K/uL   Neutrophils Relative % 86 %   Lymphocytes Relative 8 %   Monocytes Relative 6 %   Eosinophils Relative 0 %   Basophils Relative 0 %   Neutro Abs 14.1 (H) 1.7 - 7.7 K/uL   Lymphs Abs 1.3 0.7 - 4.0 K/uL   Monocytes Absolute 1.0 0.1 - 1.0 K/uL   Eosinophils Absolute 0.0 0.0 - 0.7 K/uL   Basophils Absolute 0.0 0.0 - 0.1 K/uL   WBC Morphology TOXIC GRANULATION    Smear Review LARGE PLATELETS PRESENT   I-Stat CG4 Lactic Acid, ED  Result Value Ref Range   Lactic Acid, Venous 1.71 0.5 - 1.9 mmol/L   Dg Knee Complete 4 Views Right  Result Date: 10/10/2016 CLINICAL DATA:  45 year old male with a history of right knee and left foot pain EXAM: RIGHT KNEE - COMPLETE 4+ VIEW COMPARISON:  None. FINDINGS: No acute fracture. No focal soft tissue swelling. No joint effusion. No radiopaque foreign body.  Minimal degenerative changes. IMPRESSION: Negative for acute bony abnormality. Signed, Yvone NeuJaime S. Loreta AveWagner, DO [facility] Electronically Signed   By: Gilmer MorJaime  Wagner D.O.   On: 10/10/2016 15:35   Dg Foot Complete Left  Result Date: 10/29/2016 CLINICAL DATA:  Bilateral foot pain with bruising and swelling EXAM: LEFT FOOT - COMPLETE 3+ VIEW COMPARISON:  10/10/2016 FINDINGS: No acute fracture or malalignment. Screw fixation of the medial malleolus. Surgical plate and screw fixation of the distal fibula. IMPRESSION: No acute osseous abnormality. Electronically Signed   By: Jasmine PangKim  Fujinaga M.D.   On: 10/14/2016 22:54   Dg Foot Complete Left  Result Date: 10/10/2016 CLINICAL DATA:  Edema of the left foot EXAM: LEFT FOOT - COMPLETE 3+ VIEW COMPARISON:  None. FINDINGS: Hardware for fixation of previous medial and lateral malleolar fractures is noted. No acute abnormality is seen. Tarsal -metatarsal alignment is normal. Joint spaces appear normal. IMPRESSION: No acute abnormality. Prior fixation of medial and lateral malleolar fractures. Electronically Signed   By: Dwyane DeePaul  Barry M.D.   On: 10/10/2016 15:36   Dg Foot Complete Right  Result Date: 10/30/2016 CLINICAL DATA:  Bilateral foot pain and swelling EXAM: RIGHT FOOT COMPLETE - 3+ VIEW COMPARISON:  None. FINDINGS: No acute displaced fracture or malalignment. Soft tissues are unremarkable. Minimal degenerative change at the first MTP joint. IMPRESSION: No acute osseous abnormality Electronically Signed   By: Jasmine PangKim  Fujinaga M.D.   On: 10/01/2016 22:55    Radiology No results found.  Procedures Procedures (including critical care time)  Medications Ordered in ED Medications  sodium chloride 0.9 % bolus 2,000 mL (2,000 mLs Intravenous New Bag/Given 10/05/2016 2224)   Concern for sepsis. Also concern for endocarditis. Dr. Darnelle Catalan consulted. Suggested vancomycin and Zosyn IV.  Patient will be admitted to  telemetry floor. Plan IV hydration, IV antibiotics. Cultures pending Initial Impression / Assessment and Plan / ED Course  I have reviewed the triage vital signs and the nursing notes.  Pertinent labs & imaging results that were available during my care of the patient were reviewed by me and considered in my medical decision making (see chart for details).  Clinical Course     11:20 PM took some feels improved after treatment with intravenous hydration  Final Clinical Impressions(s) / ED Diagnoses  Diagnosis #1 acute renal failure #2 hyponatremia #3 anemia #4hypotension #5 anemia #6 bilateral foot CRITICAL CARE Performed by: Doug Sou Total critical care time: 30 minutes Critical care time was exclusive of separately billable procedures and treating other patients. Critical care was necessary to treat or prevent imminent or life-threatening deterioration. Critical care was time spent personally by me on the following activities: development of treatment plan with patient and/or surrogate as well as nursing, discussions with consultants, evaluation of patient's response to treatment, examination of patient, obtaining history from patient or surrogate, ordering and performing treatments and interventions, ordering and review of laboratory studies, ordering and review of radiographic studies, pulse oximetry and re-evaluation of patient's condition. Final diagnoses:  None    New Prescriptions New Prescriptions   No medications on file     Doug Sou, MD 10/22/16 [phone]

## 2016-10-21 NOTE — ED Triage Notes (Signed)
Per EMS, pt was found sitting on the toilet for ~5 hours. Pt has edema to bilateral legs and feet. Pt is normally able to ambulate but was unable due to edema. Pt was seen for leg edema last week.

## 2016-10-21 NOTE — H&P (Addendum)
History and Physical:    Joel Sheppard   ZOX:[id number] DOB: 01/24/1971 DOA: 27-Oct-2016  Referring MD/provider: Doug SouSam Jacubowitz, MD PCP: Pcp Not In System   Patient coming from: Home.  Chief Complaint: Severe burning foot pain, diffuse back pain, flank pain  History of Present Illness:   Joel Sheppard is an 45 y.o. male with a PMH of IVDA (last used earlier today, injected a gray powder which he thought was fentanyl), hepatitis C status post treatment with Harvoni, recent hospitalization 09/05/16-09/06/16 for treatment of a heroin overdose, hepatitis C RNA not detected during that visit although he was noted to have elevated liver enzymes. He presents tonight with a chief complaint of burning foot pain. Was seen in the ED on 10/10/16 with erythema, warmth and tenderness of the left dorsal midfoot and right tibial tuberosity. Diagnosed with cellulitis and discharged on Septra 1 week. He presents today with worsening foot pain, severe in intensity, worse with weightbearing as well as a diffuse backache.  ED Course:  The patient was found to be hypotensive and very dehydrated on admission with multiple electrolyte derangements and acute renal failure with a creatinine of 4.34. His transaminases were elevated. Plain films of the feet were negative for findings concerning for osteomyelitis. He was placed on empiric vancomycin and Zosyn.  ROS:   Review of Systems  Constitutional: Positive for chills, malaise/fatigue and weight loss.  HENT: Positive for hearing loss and sore throat. Negative for ear pain.        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   Respiratory: Negative.   Cardiovascular: Negative for chest pain and palpitations.       Syncopal symptoms  Gastrointestinal: Negative.   Genitourinary: Negative.   Musculoskeletal: Positive for back pain and myalgias.       Bilateral feet painful.  Skin:       Blisters to bilateral feet  Neurological: Positive  for dizziness and weakness.  Endo/Heme/Allergies: Negative.   Psychiatric/Behavioral: Positive for substance abuse.    Past Medical History:   Past Medical History:  Diagnosis Date  . Hepatitis C    s/p treatment with Harvoni  . Heroin overdose 09/05/2016  . IV drug abuse     Past Surgical History:   Past Surgical History:  Procedure Laterality Date  . LEG SURGERY      Social History:   Social History   Social History  . Marital status: Single    Spouse name: N/A  . Number of children: N/A  . Years of education: N/A   Occupational History  . Not on file.   Social History Main Topics  . Smoking status: Current Every Day Smoker    Packs/day: 1.00    Types: Cigarettes  . Smokeless tobacco: Never Used  . Alcohol use No  . Drug use:     Frequency: 7.0 times per week    Types: Cocaine, IV, Heroin  . Sexual activity: Not on file   Other Topics Concern  . Not on file   Social History Narrative   History of being in prison. Active IV drug abuse including cocaine and heroin.    Allergies   Patient has no known allergies.  Family history:   Family History  Problem Relation Age of Onset  . Leukemia Father   . Alzheimer's disease Mother     Current Medications:   Prior to Admission medications   Medication Sig Start Date End Date Taking? Authorizing  Provider  ibuprofen (ADVIL,MOTRIN) 200 MG tablet Take 200-400 mg by mouth every 6 (six) hours as needed (for pain).   Yes Historical Provider, MD  naproxen (NAPROSYN) 250 MG tablet Take 250 mg by mouth 3 (three) times daily as needed for moderate pain.   Yes Historical Provider, MD  propranolol (INDERAL) 10 MG tablet Take 10 mg by mouth 3 (three) times daily as needed (anxiety).     Historical Provider, MD  QUEtiapine (SEROQUEL) 300 MG tablet Take 300 mg by mouth at bedtime.    Historical Provider, MD    Physical Exam:   Vitals:   10/13/2016 2029 10/04/2016 2033 10/12/2016 2054 10/10/2016 2152  BP:  (!) 87/30 (!)  87/47 (!) 87/47  Pulse:   88 81  Resp:    22  Temp: 97.5 F (36.4 C)     TempSrc: Oral     SpO2:   95% 100%     Physical Exam: Blood pressure (!) 87/47, pulse 81, temperature 97.5 F (36.4 C), temperature source Oral, resp. rate 22, SpO2 100 %. Gen: No acute distress. Ukempt. Head: Normocephalic, atraumatic. Pus in right ear canal. Eyes: Pupils equal, round and reactive to light. Extraocular movements intact.  Sclerae nonicteric. No lid lag. Cyst left lid. Mouth: Oropharynx reveals dry mucous membranes. Dentition is poor with caries and multiple missing teeth. Neck: Supple, no thyromegaly, no lymphadenopathy, no jugular venous distention. Chest: Lungs are clear to auscultation with good air movement. No rales, rhonchi or wheezes.  CV: Heart sounds are regular with an S1, S2. No rubs, clicks, or gallops. II/VI systolic ejection murmur. Abdomen: Soft, nontender, nondistended with normal active bowel sounds. No hepatosplenomegaly or palpable masses. Extremities: Extremities are pictured below, purpuric bulla/splinter hemorrhages. Skin: Warm and dry. Feet as described above. Multiple tattoos. Neuro: Alert and oriented times 3; grossly nonfocal.  Psych: Insight is and judgment are impaired. Mood and affect normal. Left foot:  Right foot:     Data Review:    Labs: Basic Metabolic Panel:  Recent Labs Lab 10/23/2016 2056  NA 121*  K 5.5*  CL 89*  CO2 20*  GLUCOSE 114*  BUN 134*  CREATININE 4.34*  CALCIUM 8.0*   Liver Function Tests:  Recent Labs Lab 10/08/2016 2056  AST 91*  ALT 83*  ALKPHOS 162*  BILITOT 2.8*  PROT 6.4*  ALBUMIN 1.8*   No results for input(s): LIPASE, AMYLASE in the last 168 hours. No results for input(s): AMMONIA in the last 168 hours. CBC:  Recent Labs Lab 10/19/2016 2056  WBC 16.4*  NEUTROABS 14.1*  HGB 10.8*  HCT 30.9*  MCV 80.5  PLT 132*   Cardiac Enzymes: No results for input(s): CKTOTAL, CKMB, CKMBINDEX, TROPONINI in the last 168  hours.  BNP (last 3 results) No results for input(s): PROBNP in the last 8760 hours. CBG: No results for input(s): GLUCAP in the last 168 hours.  Urinalysis No results found for: COLORURINE, APPEARANCEUR, LABSPEC, PHURINE, GLUCOSEU, HGBUR, BILIRUBINUR, KETONESUR, PROTEINUR, UROBILINOGEN, NITRITE, LEUKOCYTESUR    Radiographic Studies: Dg Foot Complete Left  Result Date: 10/06/2016 CLINICAL DATA:  Bilateral foot pain with bruising and swelling EXAM: LEFT FOOT - COMPLETE 3+ VIEW COMPARISON:  10/10/2016 FINDINGS: No acute fracture or malalignment. Screw fixation of the medial malleolus. Surgical plate and screw fixation of the distal fibula. IMPRESSION: No acute osseous abnormality. Electronically Signed   By: Jasmine Pang M.D.   On: 10/01/2016 22:54   Dg Foot Complete Right  Result Date: 10/20/2016 CLINICAL DATA:  Bilateral foot pain and swelling EXAM: RIGHT FOOT COMPLETE - 3+ VIEW COMPARISON:  None. FINDINGS: No acute displaced fracture or malalignment. Soft tissues are unremarkable. Minimal degenerative change at the first MTP joint. IMPRESSION: No acute osseous abnormality Electronically Signed   By: Jasmine Pang M.D.   On: 10/30/2016 22:55    EKG: Independently reviewed. Sinus rhythm at 81 bpm. No ischemic changes. Right atrial enlargement. No peaked T waves.   Assessment/Plan:   Principal Problem:   Cellulitis Patient has known history of IV drug abuse and bilateral purpuric lesions with blisters and splinter hemorrhages worrisome for infective endocarditis. Blood cultures sent. Rule out sepsis given persistent hypotension. Initial lactic acid 1.71. Empirically treat with vancomycin/Zosyn.  Active Problems:   Polysubstance abuse Social worker consultation for substance abuse counseling. Watch for signs of withdrawal.Avoid IV narcotics.    Hyperkalemia Likely secondary to profound dehydration. Monitor on telemetry and hydrate.    Elevated liver enzymes/hepatitis C antibody  test positive The patient has a known history of hepatitis C but has received treatment for this in the past. He had a viral load drawn one month ago and his viral RNA was undetectable. Hydrate and monitor.    Acute renal failure (ARF) (HCC) Suspect this is secondary to profound dehydration but could be ATN from toxic ingestion. The patient injected what he thought was fentanyl could have had contaminants. Urinalysis pending, look for casts.    Tobacco abuse Will order a nicotine patch.    Hyponatremia Unclear etiology. Sodium was normal one month ago. Suspect this is hypovolemic hyponatremia. We'll check urine sodium, and if it is low this will confirm it.    Hyperglycemia Likely stress reaction.    Hypoalbuminemia due to protein-calorie malnutrition [facility]) Dietitian consultation. Patient endorses significant weight loss.    Normocytic anemia Likely anemia of chronic disease.    Thrombocytopenia (HCC) Likely related to drug addiction.    Purulent drainage right ear/hearing loss Will need outpatient ENT evaluation.  Attestation regarding necessity of inpatient status:   The appropriate admission status for this patient is INPATIENT. Inpatient status is judged to be reasonable and necessary in order to provide the required intensity of service to ensure the patient's safety. The patient's presenting symptoms, physical exam findings, and initial radiographic and laboratory data in the context of their chronic comorbidities is felt to place them at high risk for further clinical deterioration. Furthermore, it is not anticipated that the patient will be medically stable for discharge from the hospital within 2 midnights of admission. The following factors support the admission status of inpatient.    The patient's presenting symptoms include severe bilateral foot pain, right hearing loss with pus in the canal.  The worrisome physical exam findings include purpuric blisters to bilateral  feet/splinter hemorrhages.  The initial radiographic and laboratory data are worrisome because of multiple laboratory derangements of electrolytes and acute renal failure with marked elevation of creatinine.  The chronic co-morbidities include IV drug abuse, tobacco abuse, history of hepatitis C.   * I certify that at the point of admission it is my clinical judgment that the patient will require inpatient hospital care spanning beyond 2 midnights from the point of admission due to high intensity of service, high risk for further deterioration and high frequency of surveillance required.*   Other information:   DVT prophylaxis: Lovenox ordered. Code Status: Full code. Family Communication: No family at the bedside. Disposition Plan: Home in 3-4 days. Consults called: None. Admission status: Inpatient.  The medical decision making on this patient was of high complexity and the patient is at high risk for clinical deterioration, therefore this is a level 3 visit.   Celsa Nordahl [facility] Pager [phone] Cell: [phone]   If 7PM-7AM, please contact night-coverage [email or web address] Password [email or web address] 01-15-2016, 11:59 PM

## 2016-10-21 NOTE — ED Notes (Signed)
Patient transported to X-ray 

## 2016-10-21 NOTE — ED Notes (Signed)
Delay in labs, pt in xray 

## 2016-10-21 NOTE — ED Notes (Signed)
Pt given something to eat and drink. 

## 2016-10-22 ENCOUNTER — Inpatient Hospital Stay (HOSPITAL_COMMUNITY): Payer: Medicaid Other

## 2016-10-22 DIAGNOSIS — I339 Acute and subacute endocarditis, unspecified: Secondary | ICD-10-CM

## 2016-10-22 LAB — RAPID URINE DRUG SCREEN, HOSP PERFORMED
Amphetamines: NOT DETECTED
BARBITURATES: NOT DETECTED
BENZODIAZEPINES: NOT DETECTED
Cocaine: POSITIVE — AB
Opiates: POSITIVE — AB
Tetrahydrocannabinol: POSITIVE — AB

## 2016-10-22 LAB — BLOOD CULTURE ID PANEL (REFLEXED)
ACINETOBACTER BAUMANNII: NOT DETECTED
CANDIDA ALBICANS: NOT DETECTED
CANDIDA GLABRATA: NOT DETECTED
CANDIDA TROPICALIS: NOT DETECTED
Candida krusei: NOT DETECTED
Candida parapsilosis: NOT DETECTED
ENTEROBACTER CLOACAE COMPLEX: NOT DETECTED
ENTEROBACTERIACEAE SPECIES: NOT DETECTED
ENTEROCOCCUS SPECIES: NOT DETECTED
Escherichia coli: NOT DETECTED
HAEMOPHILUS INFLUENZAE: NOT DETECTED
KLEBSIELLA PNEUMONIAE: NOT DETECTED
Klebsiella oxytoca: NOT DETECTED
LISTERIA MONOCYTOGENES: NOT DETECTED
METHICILLIN RESISTANCE: NOT DETECTED
NEISSERIA MENINGITIDIS: NOT DETECTED
PSEUDOMONAS AERUGINOSA: NOT DETECTED
Proteus species: NOT DETECTED
STREPTOCOCCUS AGALACTIAE: NOT DETECTED
STREPTOCOCCUS PNEUMONIAE: NOT DETECTED
STREPTOCOCCUS PYOGENES: NOT DETECTED
STREPTOCOCCUS SPECIES: NOT DETECTED
Serratia marcescens: NOT DETECTED
Staphylococcus aureus (BCID): DETECTED — AB
Staphylococcus species: DETECTED — AB

## 2016-10-22 LAB — CBC
HCT: 29.1 % — ABNORMAL LOW (ref 39.0–52.0)
HEMOGLOBIN: 10.3 g/dL — AB (ref 13.0–17.0)
MCH: 28.4 pg (ref 26.0–34.0)
MCHC: 35.4 g/dL (ref 30.0–36.0)
MCV: 80.2 fL (ref 78.0–100.0)
PLATELETS: 100 10*3/uL — AB (ref 150–400)
RBC: 3.63 MIL/uL — ABNORMAL LOW (ref 4.22–5.81)
RDW: 14.8 % (ref 11.5–15.5)
WBC: 15.7 10*3/uL — ABNORMAL HIGH (ref 4.0–10.5)

## 2016-10-22 LAB — COMPREHENSIVE METABOLIC PANEL
ALBUMIN: 1.6 g/dL — AB (ref 3.5–5.0)
ALK PHOS: 134 U/L — AB (ref 38–126)
ALT: 60 U/L (ref 17–63)
ANION GAP: 11 (ref 5–15)
AST: 61 U/L — ABNORMAL HIGH (ref 15–41)
BILIRUBIN TOTAL: 3 mg/dL — AB (ref 0.3–1.2)
BUN: 143 mg/dL — ABNORMAL HIGH (ref 6–20)
CALCIUM: 7.5 mg/dL — AB (ref 8.9–10.3)
CO2: 19 mmol/L — ABNORMAL LOW (ref 22–32)
CREATININE: 4.31 mg/dL — AB (ref 0.61–1.24)
Chloride: 92 mmol/L — ABNORMAL LOW (ref 101–111)
GFR calc non Af Amer: 15 mL/min — ABNORMAL LOW (ref >60)
GFR, EST AFRICAN AMERICAN: 18 mL/min — AB (ref >60)
GLUCOSE: 84 mg/dL (ref 65–99)
Potassium: 5.4 mmol/L — ABNORMAL HIGH (ref 3.5–5.1)
Sodium: 122 mmol/L — ABNORMAL LOW (ref 135–145)
TOTAL PROTEIN: 5.5 g/dL — AB (ref 6.5–8.1)

## 2016-10-22 LAB — CK: CK TOTAL: 219 U/L (ref 49–397)

## 2016-10-22 LAB — URINALYSIS, ROUTINE W REFLEX MICROSCOPIC
Glucose, UA: NEGATIVE mg/dL
Ketones, ur: NEGATIVE mg/dL
NITRITE: NEGATIVE
PROTEIN: NEGATIVE mg/dL
Specific Gravity, Urine: 1.02 (ref 1.005–1.030)
pH: 5 (ref 5.0–8.0)

## 2016-10-22 LAB — URINE MICROSCOPIC-ADD ON: SQUAMOUS EPITHELIAL / LPF: NONE SEEN

## 2016-10-22 LAB — PHOSPHORUS: PHOSPHORUS: 9 mg/dL — AB (ref 2.5–4.6)

## 2016-10-22 LAB — PROTIME-INR
INR: 1.33
PROTHROMBIN TIME: 16.6 s — AB (ref 11.4–15.2)

## 2016-10-22 LAB — MAGNESIUM: Magnesium: 2.8 mg/dL — ABNORMAL HIGH (ref 1.7–2.4)

## 2016-10-22 LAB — ECHOCARDIOGRAM COMPLETE
HEIGHTINCHES: 69 in
Weight: 2480 oz

## 2016-10-22 LAB — HIV ANTIBODY (ROUTINE TESTING W REFLEX): HIV Screen 4th Generation wRfx: NONREACTIVE

## 2016-10-22 LAB — I-STAT CG4 LACTIC ACID, ED: LACTIC ACID, VENOUS: 1.31 mmol/L (ref 0.5–1.9)

## 2016-10-22 LAB — CG4 I-STAT (LACTIC ACID): LACTIC ACID, VENOUS: 1.75 mmol/L (ref 0.5–1.9)

## 2016-10-22 MED ORDER — PREDNISONE 20 MG PO TABS
40.0000 mg | ORAL_TABLET | Freq: Every day | ORAL | Status: DC
  Administered 2016-10-22 – 2016-10-28 (×6): 40 mg via ORAL
  Filled 2016-10-22 (×6): qty 2

## 2016-10-22 MED ORDER — VANCOMYCIN HCL IN DEXTROSE 750-5 MG/150ML-% IV SOLN
750.0000 mg | INTRAVENOUS | Status: DC
  Filled 2016-10-22: qty 150

## 2016-10-22 MED ORDER — SODIUM CHLORIDE 0.9 % IV SOLN
INTRAVENOUS | Status: DC
  Administered 2016-10-22 – 2016-10-24 (×4): via INTRAVENOUS

## 2016-10-22 MED ORDER — CEFAZOLIN SODIUM-DEXTROSE 2-4 GM/100ML-% IV SOLN
2.0000 g | Freq: Two times a day (BID) | INTRAVENOUS | Status: DC
  Administered 2016-10-22 – 2016-10-23 (×2): 2 g via INTRAVENOUS
  Filled 2016-10-22 (×3): qty 100

## 2016-10-22 MED ORDER — PIPERACILLIN-TAZOBACTAM 3.375 G IVPB
3.3750 g | Freq: Three times a day (TID) | INTRAVENOUS | Status: DC
  Administered 2016-10-22 (×2): 3.375 g via INTRAVENOUS
  Filled 2016-10-22: qty 50

## 2016-10-22 MED ORDER — SALINE SPRAY 0.65 % NA SOLN
1.0000 | NASAL | Status: DC | PRN
  Administered 2016-10-24 (×2): 1 via NASAL
  Filled 2016-10-22: qty 44

## 2016-10-22 MED ORDER — SODIUM CHLORIDE 0.9% FLUSH
3.0000 mL | Freq: Two times a day (BID) | INTRAVENOUS | Status: DC
  Administered 2016-10-22 – 2016-10-30 (×13): 3 mL via INTRAVENOUS

## 2016-10-22 MED ORDER — ACETAMINOPHEN 650 MG RE SUPP: 650.0000 mg | Freq: Four times a day (QID) | RECTAL | Status: DC | PRN

## 2016-10-22 MED ORDER — PIPERACILLIN-TAZOBACTAM 3.375 G IVPB 30 MIN: 3.3750 g | Freq: Once | INTRAVENOUS | Status: DC

## 2016-10-22 MED ORDER — VANCOMYCIN HCL IN DEXTROSE 1-5 GM/200ML-% IV SOLN: 1000.0000 mg | Freq: Once | INTRAVENOUS | Status: DC

## 2016-10-22 MED ORDER — NICOTINE 14 MG/24HR TD PT24
14.0000 mg | MEDICATED_PATCH | Freq: Every day | TRANSDERMAL | Status: DC
  Administered 2016-10-22 – 2016-10-26 (×5): 14 mg via TRANSDERMAL
  Filled 2016-10-22 (×5): qty 1

## 2016-10-22 MED ORDER — PIPERACILLIN-TAZOBACTAM IN DEX 2-0.25 GM/50ML IV SOLN
2.2500 g | Freq: Four times a day (QID) | INTRAVENOUS | Status: DC
  Filled 2016-10-22: qty 50

## 2016-10-22 MED ORDER — INFLUENZA VAC SPLIT QUAD 0.5 ML IM SUSY
0.5000 mL | PREFILLED_SYRINGE | INTRAMUSCULAR | Status: AC
  Administered 2016-10-24: 0.5 mL via INTRAMUSCULAR
  Filled 2016-10-22 (×2): qty 0.5

## 2016-10-22 MED ORDER — PREMIER PROTEIN SHAKE
11.0000 [oz_av] | Freq: Two times a day (BID) | ORAL | Status: DC
  Administered 2016-10-22 – 2016-10-26 (×2): 11 [oz_av] via ORAL
  Filled 2016-10-22 (×2): qty 325.31

## 2016-10-22 MED ORDER — PANTOPRAZOLE SODIUM 40 MG PO TBEC
40.0000 mg | DELAYED_RELEASE_TABLET | Freq: Every day | ORAL | Status: DC
  Administered 2016-10-22 – 2016-10-26 (×4): 40 mg via ORAL
  Filled 2016-10-22 (×4): qty 1

## 2016-10-22 MED ORDER — ENSURE ENLIVE PO LIQD: 237.0000 mL | Freq: Two times a day (BID) | ORAL | Status: DC

## 2016-10-22 MED ORDER — POLYETHYLENE GLYCOL 3350 17 G PO PACK: 17.0000 g | PACK | Freq: Every day | ORAL | Status: DC | PRN

## 2016-10-22 MED ORDER — GI COCKTAIL ~~LOC~~
30.0000 mL | Freq: Three times a day (TID) | ORAL | Status: DC | PRN
  Administered 2016-10-22 – 2016-10-26 (×4): 30 mL via ORAL
  Filled 2016-10-22 (×5): qty 30

## 2016-10-22 MED ORDER — ENOXAPARIN SODIUM 30 MG/0.3ML ~~LOC~~ SOLN
30.0000 mg | Freq: Every day | SUBCUTANEOUS | Status: DC
  Administered 2016-10-22 – 2016-10-23 (×2): 30 mg via SUBCUTANEOUS
  Filled 2016-10-22 (×2): qty 0.3

## 2016-10-22 MED ORDER — OXYCODONE HCL 5 MG PO TABS
5.0000 mg | ORAL_TABLET | ORAL | Status: DC | PRN
  Administered 2016-10-22 – 2016-10-26 (×22): 5 mg via ORAL
  Filled 2016-10-22 (×23): qty 1

## 2016-10-22 MED ORDER — ACETAMINOPHEN 325 MG PO TABS
650.0000 mg | ORAL_TABLET | Freq: Four times a day (QID) | ORAL | Status: DC | PRN
  Administered 2016-10-22: 650 mg via ORAL
  Filled 2016-10-22: qty 2

## 2016-10-22 NOTE — ED Notes (Signed)
First Blood culture obtained at 0001
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
## 2016-10-22 NOTE — Progress Notes (Signed)
PHARMACY - PHYSICIAN COMMUNICATION CRITICAL VALUE ALERT - BLOOD CULTURE IDENTIFICATION (BCID)  Results for orders placed or performed during the hospital encounter of 11-01-2016  Blood Culture ID Panel (Reflexed) (Collected: 10/22/2016 12:20 AM)  Result Value Ref Range   Enterococcus species NOT DETECTED NOT DETECTED   Listeria monocytogenes NOT DETECTED NOT DETECTED   Staphylococcus species DETECTED (A) NOT DETECTED   Staphylococcus aureus DETECTED (A) NOT DETECTED   Methicillin resistance NOT DETECTED NOT DETECTED   Streptococcus species NOT DETECTED NOT DETECTED   Streptococcus agalactiae NOT DETECTED NOT DETECTED   Streptococcus pneumoniae NOT DETECTED NOT DETECTED   Streptococcus pyogenes NOT DETECTED NOT DETECTED   Acinetobacter baumannii NOT DETECTED NOT DETECTED   Enterobacteriaceae species NOT DETECTED NOT DETECTED   Enterobacter cloacae complex NOT DETECTED NOT DETECTED   Escherichia coli NOT DETECTED NOT DETECTED   Klebsiella oxytoca NOT DETECTED NOT DETECTED   Klebsiella pneumoniae NOT DETECTED NOT DETECTED   Proteus species NOT DETECTED NOT DETECTED   Serratia marcescens NOT DETECTED NOT DETECTED   Haemophilus influenzae NOT DETECTED NOT DETECTED   Neisseria meningitidis NOT DETECTED NOT DETECTED   Pseudomonas aeruginosa NOT DETECTED NOT DETECTED   Candida albicans NOT DETECTED NOT DETECTED   Candida glabrata NOT DETECTED NOT DETECTED   Candida krusei NOT DETECTED NOT DETECTED   Candida parapsilosis NOT DETECTED NOT DETECTED   Candida tropicalis NOT DETECTED NOT DETECTED    Name of physician (or Provider) Contacted: K. Schorr  Changes to prescribed antibiotics required: none  Joel Sheppard RPh 10/22/2016, 8:32 PM Pager [phone]

## 2016-10-22 NOTE — Progress Notes (Signed)
Pharmacy Antibiotic Note  Joel Sheppard is a 45 y.o. male admitted on 10/10/2016 with bacteremia.  Originally on vanc and zosyn now Pharmacy has been consulted for cefazolin dosing.  CrCl ~ 21.565mls/min  Plan: Cefazolin 2gm IV q12h  follow renal function, clinical course  Height: 5\' 9"  (175.3 cm) Weight: 155 lb (70.3 kg) IBW/kg (Calculated) : 70.7  Temp (24hrs), Avg:98.3 F (36.8 C), Min:97.8 F (36.6 C), Max:98.6 F (37 C)   Recent Labs Lab 10/24/2016 2056 10/03/2016 2104 10/08/2016 2105 10/22/16 0038 10/22/16 0527  WBC 16.4*  --   --   --  15.7*  CREATININE 4.34*  --   --   --  4.31*  LATICACIDVEN  --  1.75 1.71 1.31  --     Estimated Creatinine Clearance: 21.5 mL/min (by C-G formula based on SCr of 4.31 mg/dL (H)).    No Known Allergies  Antimicrobials this admission: 11/21 zosyn >> 11/22 11/21  vancomycin >> 11/22 11/22  ancef >>  Microbiology results: 11/22 BCx: staph aureus   Thank you for allowing pharmacy to be a part of this patient's care.  Arley Phenixllen Yvaine Jankowiak RPh 10/22/2016, 9:03 PM Pager [phone]

## 2016-10-22 NOTE — Progress Notes (Addendum)
      [place] Antimicrobial Management Team Staphylococcus aureus bacteremia   Staphylococcus aureus bacteremia (SAB) is associated with a high rate of complications and mortality.  Specific aspects of clinical management are critical to optimizing the outcome of patients with SAB.  Therefore, the [facility] Team [facility] has initiated an intervention aimed at improving the management of SAB at [facility].  To do so, Infectious Diseases physicians are providing an evidence-based consult for the management of all patients with SAB.     Yes No Comments  Perform follow-up blood cultures (even if the patient is afebrile) to ensure clearance of bacteremia [x]  []    Remove vascular catheter and obtain follow-up blood cultures after the removal of the catheter []  []    Perform echocardiography to evaluate for endocarditis (transthoracic ECHO is 40-50% sensitive, TEE is > 90% sensitive) [x]  []  Please keep in mind, that neither test can definitively EXCLUDE endocarditis, and that should clinical suspicion remain high for endocarditis the patient should then still be treated with an "endocarditis" duration of therapy = 6 weeks  Consult electrophysiologist to evaluate implanted cardiac device (pacemaker, ICD) []  []    Ensure source control [x]  []  Have all abscesses been drained effectively? Have deep seeded infections (septic joints or osteomyelitis) had appropriate surgical debridement?  HIS FOOT LOOKS LIKE IT MAY NEED I AND D, CERTAINLY IMAGING     Investigate for "metastatic" sites of infection [x]  []  Does the patient have ANY symptom or physical exam finding that would suggest a deeper infection (back or neck pain that may be suggestive of vertebral osteomyelitis or epidural abscess, muscle pain that could be a symptom of pyomyositis)?  Keep in mind that for deep seeded infections MRI imaging with contrast is preferred rather than other often insensitive tests such as plain  x-rays, especially early in a patient's presentation.  BACK PAIN NEEDS INVESTIGATION FOR DISKITIS   Change antibiotic therapy to cefazolin []  []  Beta-lactam antibiotics are preferred for MSSA due to higher cure rates.   If on Vancomycin, goal trough should be 15 - 20 mcg/mL  Estimated duration of IV antibiotic therapy: 6 WEEKS  Pt is IVDU will need SNF placement vs less orthodox rx  []  []  Consult case management for probably prolonged outpatient IV antibiotic therapy

## 2016-10-22 NOTE — Progress Notes (Signed)
Initial Nutrition Assessment  DOCUMENTATION CODES:   Severe malnutrition in context of chronic illness  INTERVENTION:   -Provide Premier Protein BID, each provides 160 kcal and 30 g protein. -Encourage PO intake -Pt will need feeding assistance with meals per pt report of having difficulty feeding himself. Notified RN. -Ordered new lunch items for patient -RD to continue to monitor  NUTRITION DIAGNOSIS:   Malnutrition related to acute illness as evidenced by percent weight loss, energy intake < or equal to 75% for > or equal to 1 month, moderate depletion of body fat, moderate depletions of muscle mass.  GOAL:   Patient will meet greater than or equal to 90% of their needs  MONITOR:   PO intake, Supplement acceptance, Labs, Weight trends, I & O's  REASON FOR ASSESSMENT:   Consult Assessment of nutrition requirement/status  ASSESSMENT:   45 y.o. male with a PMH of IVDA (last used earlier today, injected a gray powder which he thought was fentanyl), hepatitis C status post treatment with Harvoni, recent hospitalization 09/05/16-09/06/16 for treatment of a heroin overdose, hepatitis C RNA not detected during that visit although he was noted to have elevated liver enzymes. He presents tonight with a chief complaint of burning foot pain. Was seen in the ED on 10/10/16 with erythema, warmth and tenderness of the left dorsal midfoot and right tibial tuberosity. Diagnosed with cellulitis and discharged on Septra 1 week. He presents today with worsening foot pain, severe in intensity, worse with weightbearing as well as a diffuse backache.  Patient in room with no family at bedside. Pt with lunch tray untouched. Pt states he has been unable to eat any of his food d/t not being able to use his arms that well. Pt requesting applesauce, chocolate pudding, oranges and room temperature water. Pt states he has been sensitive to cold temperatures lately. States his ice water has been causing some  discomfort in his throat. RD provided pt with applesauce and chocolate pudding from nourishment room. Notified RN of pt's inability to feed himself.  Pt is interested in receiving protein supplements as well, agreed to try Premier Protein. RD to order.  Pt reports UBW of 195 lb. Pt has lost 41 lb since 10/7 per chart review (21% wt loss x 2 months, significant for time frame). Nutrition-Focused physical exam completed. Findings are moderate fat depletion, moderate muscle depletion, and mild edema. Pt states he has increased weakness in his legs and arms.  Medications: GI cocktail PRN Labs reviewed: Low Na Elevated K, Mg, Phos GFR: 15  Diet Order:  Diet regular Room service appropriate? Yes with Assist; Fluid consistency: Thin  Skin:  Reviewed, no issues  Last BM:  PTA  Height:   Ht Readings from Last 1 Encounters:  10/22/16 5\' 9"  (1.753 m)    Weight:   Wt Readings from Last 1 Encounters:  10/22/16 155 lb (70.3 kg)    Ideal Body Weight:  72.7 kg  BMI:  Body mass index is 22.89 kg/m.  Estimated Nutritional Needs:   Kcal:  2100-2300  Protein:  90-100g  Fluid:  2.1-2.3L/day  EDUCATION NEEDS:   No education needs identified at this time  Tilda FrancoLindsey Otniel Hoe, MS, RD, LDN Pager: [phone] After Hours Pager: [phone]

## 2016-10-22 NOTE — ED Notes (Signed)
Second blood culture obtained at [facility]0020

## 2016-10-22 NOTE — Progress Notes (Signed)
Patient not able to urinate.  Bladder scan showed 600 cc's.  Dr. Rhona Leavenshiu notified, with verbal order to put in foley.

## 2016-10-22 NOTE — Progress Notes (Addendum)
Pharmacy Antibiotic Note  Joel Sheppard is a 45 y.o. male with hx of IVDA, hepatitis C s/p treatment with Harvoni, recent hospitalization 10/6-10/7/17 for heroin overdose presents today with severe burning foot pain, diffuse back and flank pain therefore admitted on 10/11/2016 with cellulitis that is concerning for osteomyelitis.  Pharmacy has been consulted for vancomycin/zosyn dosing.  Plan: Zosyn 3.375 gm IV q8h EI (CrCl>20) Vancomycin 1Gm x1 then 750mg  IV q24h Lovenox was adjusted to 30mg  daily in pt with CrCl<30  Height: 5\' 9"  (175.3 cm) Weight: 155 lb (70.3 kg) IBW/kg (Calculated) : 70.7  Temp (24hrs), Avg:97.7 F (36.5 C), Min:97.5 F (36.4 C), Max:97.8 F (36.6 C)   Recent Labs Lab 10/09/2016 2056 10/19/2016 2105 10/22/16 0038  WBC 16.4*  --   --   CREATININE 4.34*  --   --   LATICACIDVEN  --  1.71 1.31    Estimated Creatinine Clearance: 21.4 mL/min (by C-G formula based on SCr of 4.34 mg/dL (H)).    No Known Allergies  Antimicrobials this admission: 11/22 zosyn >>  11/22 vancomycin >>   Dose adjustments this admission:   Microbiology results:  BCx:   UCx:    Sputum:    MRSA PCR:   Thank you for allowing pharmacy to be a part of this patient's care.  Lorenza EvangelistGreen, Lamel Mccarley R 10/22/2016 3:31 AM

## 2016-10-22 NOTE — Progress Notes (Signed)
PROGRESS NOTE    Joel Sheppard  GEX:[id number] DOB: 04/20/1971 DOA: 10/24/2016 PCP: Pcp Not In System    Brief Narrative:  45 y.o. male with a PMH of IVDA (last used earlier today, injected a gray powder which he thought was fentanyl), hepatitis C status post treatment with Harvoni, recent hospitalization 09/05/16-09/06/16 for treatment of a heroin overdose, hepatitis C RNA not detected during that visit although he was noted to have elevated liver enzymes. He presents tonight with a chief complaint of burning foot pain. Was seen in the ED on 10/10/16 with erythema, warmth and tenderness of the left dorsal midfoot and right tibial tuberosity. Diagnosed with cellulitis and discharged on Septra 1 week. He presents today with worsening foot pain, severe in intensity, worse with weightbearing as well as a diffuse backache  Assessment & Plan:   Principal Problem:   Cellulitis Active Problems:   Hyperkalemia   Elevated liver enzymes   Acute renal failure (ARF) (HCC)   Hepatitis C antibody test positive   Tobacco abuse   Hyponatremia   Hyperglycemia   Hypoalbuminemia due to protein-calorie malnutrition (HCC)   Normocytic anemia   Thrombocytopenia (HCC)    Cellulitis -Bilateral foot lesions noted, appears to be stable -Patient is continued on empiric vancomycin and Zosyn -Afebrile at present with white blood count of 15.7 thousand, which is slightly improved from yesterday    Polysubstance abuse -Patient reports prior drug rehabilitation programs and reportedly had been clean for over 7 years at one point. -Patient still has contact for drug rehabilitation program and states he will follow-up once he is discharged    Hyperkalemia -Suspect secondary to presenting acute renal failure -This morning, potassium remains elevated of 5.4 -Patient's continued on IV fluids -Repeat basic metabolic panel morning    Elevated liver enzymes/hepatitis C antibody test positive -Known prior  history of hepatitis C status post treatment -Last viral load noted to be undetectable -See above, patient again advised to cease using drugs of abuse    Acute renal failure (ARF) (HCC) -Creatinine remains elevated at 4.3 today -Bladder ultrasound noted over 600 mL -We'll place an indwelling Foley catheter -Continue IV fluids for now -Recheck basic metabolic panel morning -Question acute renal failure secondary to dehydration versus ATN versus bladder outlet obstruction    Tobacco abuse -Continue nicotine patch for now    Hyponatremia -Initial concerns for hypovolemic hyponatremia -Sodium remains 122 today -Per above, place indwelling Foley catheter.     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 -Per above, continue Vanco and Zosyn for now -We'll request transesophageal echocardiogram, evaluate for valvular abscess or other valvular disease -Anticipate patient will require 6-8 weeks of IV antibiotics, pending results of blood cultures  Polyarticular arthritis -Patient reports multiple joint pains including fingers back and knees and hips associated with joint swelling -Given acute renal failure, patient will not tolerate NSAID -We'll give trial of prednisone  DVT prophylaxis: Lovenox subcutaneous Code Status: Full code Family Communication: Patient in room, family not at bedside Disposition Plan: Uncertain at  this  time  Consultants:     Procedures:   2-D echocardiogram 10/22/2016  Antimicrobials: Anti-infectives    Start     Dose/Rate Route Frequency Ordered Stop   10/22/16 2359  vancomycin (VANCOCIN) IVPB 750 mg/150 ml premix     750 mg 150 mL/hr over 60 Minutes Intravenous Every 24 hours 10/22/16 0336     10/22/16 0800  piperacillin-tazobactam (ZOSYN) IVPB 2.25 g  Status:  Discontinued     2.25 g 100 mL/hr over 30 Minutes Intravenous Every 6 hours 10/22/16 0336 10/22/16 0505   10/22/16 0800  piperacillin-tazobactam (ZOSYN) IVPB 3.375 g     3.375 g 12.5 mL/hr over 240 Minutes Intravenous Every 8 hours 10/22/16 0505     10/22/16 0315  piperacillin-tazobactam (ZOSYN) IVPB 3.375 g  Status:  Discontinued     3.375 g 100 mL/hr over 30 Minutes Intravenous  Once 10/22/16 0302 10/22/16 0304   10/22/16 0315  vancomycin (VANCOCIN) IVPB 1000 mg/200 mL premix  Status:  Discontinued     1,000 mg 200 mL/hr over 60 Minutes Intravenous  Once 10/22/16 0302 10/22/16 0304   10/01/2016 2330  piperacillin-tazobactam (ZOSYN) IVPB 3.375 g     3.375 g 12.5 mL/hr over 240 Minutes Intravenous  Once 10/07/2016 2329 10/22/16 0444   10/19/2016 2330  vancomycin (VANCOCIN) IVPB 1000 mg/200 mL premix     1,000 mg 200 mL/hr over 60 Minutes Intravenous  Once 10/26/2016 2329 10/22/16 0148       Subjective: Complaining of diffuse joint pains  Objective: Vitals:   10/22/16 0145 10/22/16 0239 10/22/16 0456 10/22/16 1546  BP: (!) 106/41 (!) 107/51 (!) 96/41 (!) 104/43  Pulse:  84 83 77  Resp:   16 18  Temp:  97.8 F (36.6 C) 98.6 F (37 C) 98.4 F (36.9 C)  TempSrc:  Oral Oral Oral  SpO2:  99% 99% 99%  Weight:  70.3 kg (155 lb)    Height:  5\' 9"  (1.753 m)      Intake/Output Summary (Last 24 hours) at 10/22/16 1844 Last data filed at 10/22/16 1502  Gross per 24 hour  Intake             4085 ml  Output                0 ml  Net             4085 ml   Filed Weights   10/22/16 0239  Weight: 70.3 kg (155 lb)     Examination:  General exam: Appears calm and comfortable  Respiratory system: Clear to auscultation. Respiratory effort normal. Cardiovascular system: S1 & S2 heard, RRR. Systolic murmur auscultated Gastrointestinal system: Abdomen is nondistended, soft and nontender. No organomegaly or masses felt. Normal bowel sounds heard. Central nervous system: Alert and oriented. No focal neurological deficits. Extremities: Symmetric 5 x 5 power. Skin: No rashes, lesions  Psychiatry: Judgement and insight appear normal. Mood & affect appropriate.   Data Reviewed: I have personally reviewed following labs and imaging studies  CBC:  Recent Labs Lab 10/11/2016 2056 10/22/16 0527  WBC 16.4* 15.7*  NEUTROABS 14.1*  --   HGB 10.8* 10.3*  HCT 30.9* 29.1*  MCV 80.5 80.2  PLT 132* 100*   Basic Metabolic Panel:  Recent Labs Lab 10/18/2016 2056 10/22/16 0527  NA 121* 122*  K 5.5* 5.4*  CL 89* 92*  CO2 20* 19*  GLUCOSE 114* 84  BUN 134* 143*  CREATININE 4.34* 4.31*  CALCIUM 8.0* 7.5*  MG  --  2.8*  PHOS  --  9.0*   GFR: Estimated Creatinine Clearance: 21.5 mL/min (by C-G formula based on SCr of 4.31 mg/dL (H)). Liver Function Tests:  Recent Labs Lab 10/19/2016 2056 10/22/16 0527  AST 91* 61*  ALT 83* 60  ALKPHOS 162* 134*  BILITOT 2.8* 3.0*  PROT 6.4* 5.5*  ALBUMIN 1.8* 1.6*   No results for input(s): LIPASE, AMYLASE in the last 168 hours. No results for input(s): AMMONIA in the last 168 hours. Coagulation Profile:  Recent Labs Lab 10/22/16 0527  INR 1.33   Cardiac Enzymes:  Recent Labs Lab 10/22/16 0022  CKTOTAL 219   BNP (last 3 results) No results for input(s): PROBNP in the last 8760 hours. HbA1C: No results for input(s): HGBA1C in the last 72 hours. CBG: No results for input(s): GLUCAP in the last 168 hours. Lipid Profile: No results for input(s): CHOL, HDL, LDLCALC, TRIG, CHOLHDL, LDLDIRECT in the last 72 hours. Thyroid Function Tests: No results for  input(s): TSH, T4TOTAL, FREET4, T3FREE, THYROIDAB in the last 72 hours. Anemia Panel: No results for input(s): VITAMINB12, FOLATE, FERRITIN, TIBC, IRON, RETICCTPCT in the last 72 hours. Sepsis Labs:  Recent Labs Lab 10/30/2016 2104 10/10/2016 2105 10/22/16 0038  LATICACIDVEN 1.75 1.71 1.31    Recent Results (from the past 240 hour(s))  Blood culture (routine x 2)     Status: None (Preliminary result)   Collection Time: 10/22/16 12:20 AM  Result Value Ref Range Status   Specimen Description BLOOD LEFT ANTECUBITAL  Final   Special Requests BOTTLES DRAWN AEROBIC AND ANAEROBIC 5ML  Final   Culture  Setup Time   Final    GRAM POSITIVE COCCI IN CLUSTERS IN BOTH AEROBIC AND ANAEROBIC BOTTLES Organism ID to follow [facility] [place]    Culture PENDING  Incomplete   Report Status PENDING  Incomplete     Radiology Studies: Dg Foot Complete Left  Result Date: 10/12/2016 CLINICAL DATA:  Bilateral foot pain with bruising and swelling EXAM: LEFT FOOT - COMPLETE 3+ VIEW COMPARISON:  10/10/2016 FINDINGS: No acute fracture or malalignment. Screw fixation of the medial malleolus. Surgical plate and screw fixation of the distal fibula. IMPRESSION: No acute osseous abnormality. Electronically Signed   By: Jasmine PangKim  Fujinaga M.D.   On: 10/13/2016 22:54   Dg Foot Complete Right  Result Date: 10/30/2016 CLINICAL DATA:  Bilateral foot pain and swelling EXAM: RIGHT FOOT COMPLETE - 3+ VIEW COMPARISON:  None. FINDINGS: No acute displaced fracture or malalignment. Soft tissues are unremarkable. Minimal degenerative change at the first MTP joint. IMPRESSION: No acute osseous abnormality Electronically Signed   By: Jasmine PangKim  Fujinaga M.D.   On: 10/20/2016 22:55    Scheduled Meds: . enoxaparin (LOVENOX) injection  30 mg Subcutaneous Daily  . [START ON 10/23/2016] Influenza vac split quadrivalent PF  0.5 mL Intramuscular Tomorrow-1000  . nicotine  14 mg Transdermal Daily  . pantoprazole  40 mg Oral  Daily  . piperacillin-tazobactam (ZOSYN)  IV  3.375 g Intravenous Q8H  . predniSONE  40 mg Oral Q breakfast  . protein supplement shake  11 oz Oral BID BM  . sodium chloride flush  3 mL Intravenous Q12H  . vancomycin  750 mg Intravenous Q24H   Continuous Infusions: . sodium chloride 150 mL/hr at 10/22/16 0356     LOS: 1 day   Tawnee Clegg, Scheryl MartenSTEPHEN K, MD [facility] Pager [phone]  If 7PM-7AM, please contact night-coverage [email or web address] Password [email or web address] 10/22/2016, 6:44 PM

## 2016-10-22 NOTE — Progress Notes (Signed)
  Echocardiogram 2D Echocardiogram has been performed.  Nolon RodBrown, Tony 10/22/2016, 10:51 AM

## 2016-10-23 ENCOUNTER — Inpatient Hospital Stay (HOSPITAL_COMMUNITY): Payer: Medicaid Other

## 2016-10-23 DIAGNOSIS — Z8619 Personal history of other infectious and parasitic diseases: Secondary | ICD-10-CM

## 2016-10-23 DIAGNOSIS — I330 Acute and subacute infective endocarditis: Secondary | ICD-10-CM

## 2016-10-23 DIAGNOSIS — F1910 Other psychoactive substance abuse, uncomplicated: Secondary | ICD-10-CM

## 2016-10-23 DIAGNOSIS — I071 Rheumatic tricuspid insufficiency: Secondary | ICD-10-CM

## 2016-10-23 DIAGNOSIS — Z832 Family history of diseases of the blood and blood-forming organs and certain disorders involving the immune mechanism: Secondary | ICD-10-CM

## 2016-10-23 DIAGNOSIS — F1990 Other psychoactive substance use, unspecified, uncomplicated: Secondary | ICD-10-CM

## 2016-10-23 DIAGNOSIS — I351 Nonrheumatic aortic (valve) insufficiency: Secondary | ICD-10-CM

## 2016-10-23 DIAGNOSIS — Z820 Family history of epilepsy and other diseases of the nervous system: Secondary | ICD-10-CM

## 2016-10-23 DIAGNOSIS — I76 Septic arterial embolism: Secondary | ICD-10-CM

## 2016-10-23 DIAGNOSIS — F17210 Nicotine dependence, cigarettes, uncomplicated: Secondary | ICD-10-CM

## 2016-10-23 DIAGNOSIS — A4101 Sepsis due to Methicillin susceptible Staphylococcus aureus: Secondary | ICD-10-CM

## 2016-10-23 DIAGNOSIS — I079 Rheumatic tricuspid valve disease, unspecified: Secondary | ICD-10-CM

## 2016-10-23 DIAGNOSIS — I358 Other nonrheumatic aortic valve disorders: Secondary | ICD-10-CM

## 2016-10-23 DIAGNOSIS — I368 Other nonrheumatic tricuspid valve disorders: Secondary | ICD-10-CM

## 2016-10-23 LAB — BASIC METABOLIC PANEL
ANION GAP: 11 (ref 5–15)
BUN: 153 mg/dL — AB (ref 6–20)
CHLORIDE: 99 mmol/L — AB (ref 101–111)
CO2: 15 mmol/L — ABNORMAL LOW (ref 22–32)
Calcium: 7.4 mg/dL — ABNORMAL LOW (ref 8.9–10.3)
Creatinine, Ser: 4.12 mg/dL — ABNORMAL HIGH (ref 0.61–1.24)
GFR calc Af Amer: 19 mL/min — ABNORMAL LOW (ref >60)
GFR calc non Af Amer: 16 mL/min — ABNORMAL LOW (ref >60)
Glucose, Bld: 139 mg/dL — ABNORMAL HIGH (ref 65–99)
POTASSIUM: 5.6 mmol/L — AB (ref 3.5–5.1)
SODIUM: 125 mmol/L — AB (ref 135–145)

## 2016-10-23 LAB — CBC
HCT: 25.6 % — ABNORMAL LOW (ref 39.0–52.0)
HEMOGLOBIN: 9 g/dL — AB (ref 13.0–17.0)
MCH: 27.8 pg (ref 26.0–34.0)
MCHC: 35.2 g/dL (ref 30.0–36.0)
MCV: 79 fL (ref 78.0–100.0)
Platelets: 91 10*3/uL — ABNORMAL LOW (ref 150–400)
RBC: 3.24 MIL/uL — AB (ref 4.22–5.81)
RDW: 14.8 % (ref 11.5–15.5)
WBC: 8.2 10*3/uL (ref 4.0–10.5)

## 2016-10-23 LAB — SODIUM, URINE, RANDOM: Sodium, Ur: <10 mmol/L

## 2016-10-23 MED ORDER — NAFCILLIN SODIUM 2 G IJ SOLR
2.0000 g | INTRAVENOUS | Status: DC
  Administered 2016-10-23 – 2016-10-28 (×28): 2 g via INTRAVENOUS
  Filled 2016-10-23 (×41): qty 2000

## 2016-10-23 MED ORDER — NAFCILLIN SODIUM 2 G IJ SOLR
2.0000 g | INTRAMUSCULAR | Status: DC
  Filled 2016-10-23 (×2): qty 2000

## 2016-10-23 NOTE — Progress Notes (Addendum)
PROGRESS NOTE    Joel Sheppard  ZOX:[id number] DOB: 11/26/1971 DOA: 02/22/2016 PCP: Pcp Not In System    Brief Narrative:  45 y.o. male with a PMH of IVDA (last used earlier today, injected a gray powder which he thought was fentanyl), hepatitis C status post treatment with Harvoni, recent hospitalization 09/05/16-09/06/16 for treatment of a heroin overdose, hepatitis C RNA not detected during that visit although he was noted to have elevated liver enzymes. He presents tonight with a chief complaint of burning foot pain. Was seen in the ED on 10/10/16 with erythema, warmth and tenderness of the left dorsal midfoot and right tibial tuberosity. Diagnosed with cellulitis and discharged on Septra 1 week. He presents today with worsening foot pain, severe in intensity, worse with weightbearing as well as a diffuse backache  Assessment & Plan:   Principal Problem:   Cellulitis Active Problems:   Hyperkalemia   Elevated liver enzymes   Acute renal failure (HCC)   Hepatitis C antibody test positive   Tobacco abuse   Hyponatremia   Hyperglycemia   Hypoalbuminemia due to protein-calorie malnutrition (HCC)   Normocytic anemia   Thrombocytopenia (HCC)   IVDU (intravenous drug user)   History of hepatitis C   Staphylococcus aureus bacteremia with sepsis (HCC)   Endocarditis of tricuspid valve   Aortic valve endocarditis   Severe aortic regurgitation   Severe tricuspid regurgitation   Osler's node   Septic embolism (HCC)    Cellulitis -Bilateral foot lesions noted, surrounding areas of erythema have improved -Patient was initially continued on empiric vancomycin and Zosyn -Patient remains afebrile, leukocytosis has resolved, currently 8.2 -CT scan of right foot obtained. No evidence of abscess. Findings of soft tissue edema noted. -See below. Discussed case with infectious disease who has made changes to antibiotic regimen.    Polysubstance abuse -Patient reports prior drug  rehabilitation programs and reportedly had been clean for over 7 years at one point. -Patient claims he is now motivated to quit for good following the events of this hospital admission.    Hyperkalemia -Suspect secondary to presenting acute renal failure -Potassium remains above the upper limits of normal, currently 5.6 no peak T waves on EKG -Patient is continued on IV fluids per below -Repeat recent metabolic panel in the morning    Elevated liver enzymes/hepatitis C antibody test positive -Known prior history of hepatitis C status post treatment -Last viral load noted to be undetectable, last checked in October 2017 -Plan to recheck for HIV, hepatitis B, hepatitis C per infectious disease recommendations    Acute renal failure (ARF) (HCC) -BUN remains elevated, currently over 140 with creatinine essentially unchanged at over 4 -Recent bladder ultrasound noted over 600 mL -A she continues with indwelling Foley catheter. Overnight, patient noted to have 700 mL of urine output. -Have discussed case with nephrology who will follow in consultation after [facility]. Nephrology recommendations for continued indwelling Foley catheter, obtaining renal ultrasound, continuing IV fluid hydration him a avoiding nephrotoxic agents    Tobacco abuse -Seems stable. Continue nicotine patch for now    Hyponatremia -Initial concerns for hypovolemic hyponatremia -Sodium somewhat improved to 125 with IV fluids -Continue IV fluids and indwelling Foley catheter, repeat basic metabolic panel in the morning    Hyperglycemia -Random glucose today noted to be 139    Hypoalbuminemia due to protein-calorie malnutrition [facility](HCC) -Dietitian consulted -Appears stable this time    Normocytic anemia -Suspect secondary to anemia of chronic disease -There are  no signs of acute blood loss -Hemoglobin seems to be trending down slowly. -We'll repeat CBC in the morning     Thrombocytopenia (HCC) -Platelets today less than 100,000 -Would discontinue Lovenox. Start SCDs for DVT prophylaxis    Purulent drainage right ear/hearing loss -Recommend ENT follow-up as outpatient  Endocarditis -Blood cultures thus far notable for staph aureus -2-D echocardiogram has evidence of aortic valve endocarditis and tricuspid valve endocarditis -Patient initially continued on Vanco and Zosyn. Infectious disease recommends transition to nafcillin penetration in case patient has emboli to the brain -Have discussed case with cardiology, plans for transesophageal echocardiogram on 10/24/2016. Pending results of TEE, formal CT surgical consultation may be warranted at that time. -Anticipate patient will require 6-8 weeks of IV antibiotics. Per infectious disease, would not recommend PICC line at this time until bacteremia has cleared -Discussed case with infectious disease. Recommendations for transfer to [facility] as patient will likely require a more intensive multidisciplinary care involving infectious disease, nephrology, cardiology, potentially CT surgery.  Polyarticular arthritis -Patient reports multiple joint pains including fingers back and knees and hips associated with joint swelling -Given acute renal failure, patient will not tolerate NSAID -Started trial of prednisone. Patient has reported some improvement.  DVT prophylaxis: Lovenox subcutaneous discontinued, transitioned to SCDs secondary to thrombocytopenia Code Status: Full code Family Communication: Patient in room, family not at bedside Disposition Plan: Uncertain at this time  Consultants:   Infectious disease  Nephrology  Procedures:   2-D echocardiogram 10/22/2016  Antimicrobials: Anti-infectives    Start     Dose/Rate Route Frequency Ordered Stop   10/23/16 1700  nafcillin injection 2 g  Status:  Discontinued     2 g Intravenous Every 4 hours 10/23/16 1617 10/23/16 1655   10/23/16 1700   nafcillin 2 g in dextrose 5 % 100 mL IVPB     2 g 200 mL/hr over 30 Minutes Intravenous Every 4 hours 10/23/16 1655     10/22/16 2359  vancomycin (VANCOCIN) IVPB 750 mg/150 ml premix  Status:  Discontinued     750 mg 150 mL/hr over 60 Minutes Intravenous Every 24 hours 10/22/16 0336 10/22/16 2045   10/22/16 2200  ceFAZolin (ANCEF) IVPB 2g/100 mL premix  Status:  Discontinued     2 g 200 mL/hr over 30 Minutes Intravenous Every 12 hours 10/22/16 2105 10/23/16 1617   10/22/16 0800  piperacillin-tazobactam (ZOSYN) IVPB 2.25 g  Status:  Discontinued     2.25 g 100 mL/hr over 30 Minutes Intravenous Every 6 hours 10/22/16 0336 10/22/16 0505   10/22/16 0800  piperacillin-tazobactam (ZOSYN) IVPB 3.375 g  Status:  Discontinued     3.375 g 12.5 mL/hr over 240 Minutes Intravenous Every 8 hours 10/22/16 0505 10/22/16 2045   10/22/16 0315  piperacillin-tazobactam (ZOSYN) IVPB 3.375 g  Status:  Discontinued     3.375 g 100 mL/hr over 30 Minutes Intravenous  Once 10/22/16 0302 10/22/16 0304   10/22/16 0315  vancomycin (VANCOCIN) IVPB 1000 mg/200 mL premix  Status:  Discontinued     1,000 mg 200 mL/hr over 60 Minutes Intravenous  Once 10/22/16 0302 10/22/16 0304   10/30/2016 2330  piperacillin-tazobactam (ZOSYN) IVPB 3.375 g     3.375 g 12.5 mL/hr over 240 Minutes Intravenous  Once 10/27/2016 2329 10/22/16 0444   10/20/2016 2330  vancomycin (VANCOCIN) IVPB 1000 mg/200 mL premix     1,000 mg 200 mL/hr over 60 Minutes Intravenous  Once 10/17/2016 2329 10/22/16 0148      Subjective:  Still complaining of multiple joint pains and stiffness  Objective: Vitals:   10/22/16 1546 10/22/16 2212 10/23/16 0612 10/23/16 1501  BP: (!) 104/43 (!) 106/41 (!) 109/41 (!) 119/37  Pulse: 77 80 83 88  Resp: 18 18 18 19   Temp: 98.4 F (36.9 C) 98.4 F (36.9 C) 97.7 F (36.5 C) 97.8 F (36.6 C)  TempSrc: Oral Oral Oral Oral  SpO2: 99% 97% 97% 97%  Weight:      Height:        Intake/Output Summary (Last 24 hours) at  10/23/16 1710 Last data filed at 10/23/16 0900  Gross per 24 hour  Intake             2855 ml  Output              700 ml  Net             2155 ml   Filed Weights   10/22/16 0239  Weight: 70.3 kg (155 lb)    Examination:  General exam: Lying in bed, no acute distress  Respiratory system: Normal respiratory effort, no audible wheezing Cardiovascular system: Regular rate, S1-S2, systolic murmur auscultated Gastrointestinal system: Soft, positive bowel sounds Central nervous system: CN II through XII grossly intact, sensation intact throughout Extremities: Perfused, no cyanosis, bullae over right ankle with surrounding erythema that has much improved Skin: Normal skin turgor, bullae over right ankle per above, no active drainage, Osler's nodes over fingertips  Psychiatry: Mood normal, no visual hallucinations  Data Reviewed: I have personally reviewed following labs and imaging studies  CBC:  Recent Labs Lab 10/10/2016 2056 10/22/16 0527 10/23/16 0507  WBC 16.4* 15.7* 8.2  NEUTROABS 14.1*  --   --   HGB 10.8* 10.3* 9.0*  HCT 30.9* 29.1* 25.6*  MCV 80.5 80.2 79.0  PLT 132* 100* 91*   Basic Metabolic Panel:  Recent Labs Lab 10/24/2016 2056 10/22/16 0527 10/23/16 0507  NA 121* 122* 125*  K 5.5* 5.4* 5.6*  CL 89* 92* 99*  CO2 20* 19* 15*  GLUCOSE 114* 84 139*  BUN 134* 143* 153*  CREATININE 4.34* 4.31* 4.12*  CALCIUM 8.0* 7.5* 7.4*  MG  --  2.8*  --   PHOS  --  9.0*  --    GFR: Estimated Creatinine Clearance: 22.5 mL/min (by C-G formula based on SCr of 4.12 mg/dL (H)). Liver Function Tests:  Recent Labs Lab 10/12/2016 2056 10/22/16 0527  AST 91* 61*  ALT 83* 60  ALKPHOS 162* 134*  BILITOT 2.8* 3.0*  PROT 6.4* 5.5*  ALBUMIN 1.8* 1.6*   No results for input(s): LIPASE, AMYLASE in the last 168 hours. No results for input(s): AMMONIA in the last 168 hours. Coagulation Profile:  Recent Labs Lab 10/22/16 0527  INR 1.33   Cardiac Enzymes:  Recent  Labs Lab 10/22/16 0022  CKTOTAL 219   BNP (last 3 results) No results for input(s): PROBNP in the last 8760 hours. HbA1C: No results for input(s): HGBA1C in the last 72 hours. CBG: No results for input(s): GLUCAP in the last 168 hours. Lipid Profile: No results for input(s): CHOL, HDL, LDLCALC, TRIG, CHOLHDL, LDLDIRECT in the last 72 hours. Thyroid Function Tests: No results for input(s): TSH, T4TOTAL, FREET4, T3FREE, THYROIDAB in the last 72 hours. Anemia Panel: No results for input(s): VITAMINB12, FOLATE, FERRITIN, TIBC, IRON, RETICCTPCT in the last 72 hours. Sepsis Labs:  Recent Labs Lab 10/02/2016 2104 10/04/2016 2105 10/22/16 0038  LATICACIDVEN 1.75 1.71 1.31    Recent  Results (from the past 240 hour(s))  Blood culture (routine x 2)     Status: Abnormal (Preliminary result)   Collection Time: 10/22/16 12:01 AM  Result Value Ref Range Status   Specimen Description BLOOD RIGHT ARM  Final   Special Requests BOTTLES DRAWN AEROBIC AND ANAEROBIC  Final   Culture  Setup Time   Final    GRAM POSITIVE COCCI IN CLUSTERS IN BOTH AEROBIC AND ANAEROBIC BOTTLES CRITICAL VALUE NOTED.  VALUE IS CONSISTENT WITH PREVIOUSLY REPORTED AND CALLED VALUE. Performed at [facility]    Culture STAPHYLOCOCCUS AUREUS (A)  Final   Report Status PENDING  Incomplete  Blood culture (routine x 2)     Status: None (Preliminary result)   Collection Time: 10/22/16 12:20 AM  Result Value Ref Range Status   Specimen Description BLOOD LEFT ANTECUBITAL  Final   Special Requests BOTTLES DRAWN AEROBIC AND ANAEROBIC  Final   Culture  Setup Time   Final    GRAM POSITIVE COCCI IN CLUSTERS IN BOTH AEROBIC AND ANAEROBIC BOTTLES Organism ID to follow CRITICAL RESULT CALLED TO, READ BACK BY AND VERIFIED WITHErling Cruz [facility] 2017 10/22/16 A BROWNING    Culture   Final    CULTURE REINCUBATED FOR BETTER GROWTH Performed at [facility]    Report Status PENDING  Incomplete  Blood Culture  ID Panel (Reflexed)     Status: Abnormal   Collection Time: 10/22/16 12:20 AM  Result Value Ref Range Status   Enterococcus species NOT DETECTED NOT DETECTED Final   Listeria monocytogenes NOT DETECTED NOT DETECTED Final   Staphylococcus species DETECTED (A) NOT DETECTED Final    Comment: CRITICAL RESULT CALLED TO, READ BACK BY AND VERIFIED WITH: E JACKSON PHARMD 2017 10/22/16 A BROWNING    Staphylococcus aureus DETECTED (A) NOT DETECTED Final    Comment: CRITICAL RESULT CALLED TO, READ BACK BY AND VERIFIED WITH: Erling Cruz [facility] 2017 10/22/16 A BROWNING    Methicillin resistance NOT DETECTED NOT DETECTED Final   Streptococcus species NOT DETECTED NOT DETECTED Final   Streptococcus agalactiae NOT DETECTED NOT DETECTED Final   Streptococcus pneumoniae NOT DETECTED NOT DETECTED Final   Streptococcus pyogenes NOT DETECTED NOT DETECTED Final   Acinetobacter baumannii NOT DETECTED NOT DETECTED Final   Enterobacteriaceae species NOT DETECTED NOT DETECTED Final   Enterobacter cloacae complex NOT DETECTED NOT DETECTED Final   Escherichia coli NOT DETECTED NOT DETECTED Final   Klebsiella oxytoca NOT DETECTED NOT DETECTED Final   Klebsiella pneumoniae NOT DETECTED NOT DETECTED Final   Proteus species NOT DETECTED NOT DETECTED Final   Serratia marcescens NOT DETECTED NOT DETECTED Final   Haemophilus influenzae NOT DETECTED NOT DETECTED Final   Neisseria meningitidis NOT DETECTED NOT DETECTED Final   Pseudomonas aeruginosa NOT DETECTED NOT DETECTED Final   Candida albicans NOT DETECTED NOT DETECTED Final   Candida glabrata NOT DETECTED NOT DETECTED Final   Candida krusei NOT DETECTED NOT DETECTED Final   Candida parapsilosis NOT DETECTED NOT DETECTED Final   Candida tropicalis NOT DETECTED NOT DETECTED Final    Comment: Performed at [facility]  Culture, blood (Routine X 2) w Reflex to ID Panel     Status: None (Preliminary result)   Collection Time: 10/22/16 10:59 PM  Result Value  Ref Range Status   Specimen Description BLOOD LEFT ANTECUBITAL  Final   Special Requests BOTTLES DRAWN AEROBIC AND ANAEROBIC 10 CC  Final   Culture   Final  NO GROWTH < 12 HOURS Performed at [facility]    Report Status PENDING  Incomplete     Radiology Studies: Ct Foot Right Wo Contrast  Result Date: 10/23/2016 CLINICAL DATA:  Erythema, warmth, and tenderness of the dorsal left midfoot. Cellulitis. Increasing pain. EXAM: CT OF THE RIGHT FOOT WITHOUT CONTRAST TECHNIQUE: Multidetector CT imaging of the right foot was performed according to the standard protocol. Multiplanar CT image reconstructions were also generated. COMPARISON:  Radiographs dated 10/29/2016 FINDINGS: Bones/Joint/Cartilage There small marginal osteophytes on the distal tibia at the ankle joint. The bones of the foot appear normal. There are no appreciable joint effusions. There is nonspecific subcutaneous edema around the ankle and on the dorsum of the foot most prominent over the midfoot but there is no definable abscess. No evidence of osteomyelitis. IMPRESSION: Nonspecific subcutaneous edema around the ankle and foot. The findings could represent cellulitis. No evidence of abscess or osteomyelitis or joint effusions. Electronically Signed   By: Francene BoyersJames  Maxwell M.D.   On: 10/23/2016 13:52   Dg Foot Complete Left  Result Date: 10/06/2016 CLINICAL DATA:  Bilateral foot pain with bruising and swelling EXAM: LEFT FOOT - COMPLETE 3+ VIEW COMPARISON:  10/10/2016 FINDINGS: No acute fracture or malalignment. Screw fixation of the medial malleolus. Surgical plate and screw fixation of the distal fibula. IMPRESSION: No acute osseous abnormality. Electronically Signed   By: Jasmine PangKim  Fujinaga M.D.   On: 10/20/2016 22:54   Dg Foot Complete Right  Result Date: 10/02/2016 CLINICAL DATA:  Bilateral foot pain and swelling EXAM: RIGHT FOOT COMPLETE - 3+ VIEW COMPARISON:  None. FINDINGS: No acute displaced fracture or malalignment. Soft  tissues are unremarkable. Minimal degenerative change at the first MTP joint. IMPRESSION: No acute osseous abnormality Electronically Signed   By: Jasmine PangKim  Fujinaga M.D.   On: 10/18/2016 22:55    Scheduled Meds: . Influenza vac split quadrivalent PF  0.5 mL Intramuscular Tomorrow-1000  . nafcillin IV  2 g Intravenous Q4H  . nicotine  14 mg Transdermal Daily  . pantoprazole  40 mg Oral Daily  . predniSONE  40 mg Oral Q breakfast  . protein supplement shake  11 oz Oral BID BM  . sodium chloride flush  3 mL Intravenous Q12H   Continuous Infusions: . sodium chloride 150 mL/hr at 10/23/16 1420     LOS: 2 days   [place] Mcphee, Scheryl MartenSTEPHEN K, MD [facility] Pager [phone]  If 7PM-7AM, please contact night-coverage [email or web address] Password [email or web address] 10/23/2016, 5:10 PM

## 2016-10-23 NOTE — Progress Notes (Signed)
New Admission Note: [facility] 6[facility]  Arrival Method: via carelink  Mental Orientation: Alert and Oriented x 4 Telemetry: Placed on box 5 Assessment: Completed Skin: R foot blister, necrotic areas to fingers IV: RAC Pain: Generalized Tubes: None Safety Measures: Safety Fall Prevention Plan has been discussed  Admission: To be completed [facility] [place] Orientation: Patient has been orientated to the room, unit and staff.  Family: none at bedside  Orders to be reviewed and implemented. Will continue to monitor the patient. Call light has been placed within reach and bed alarm has been activated.   Burley SaverKami Nero Sawatzky, BSN, RN-BC Phone: [phone]

## 2016-10-23 NOTE — Progress Notes (Signed)
Writer gave receiving [facility]. Will await [facility] transfer. Pt aware of transfer to [facility].

## 2016-10-23 NOTE — Consult Note (Signed)
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 male IV drug user who states that he had been in remission for nearly 7 years until he relapsed approximately 8 months ago. He had apparently injected a Gray powder intravenously every days prior to onset of symptoms where he began complaining of burning pain in his feet. He is in the ER November 10 and found to have erythema and warmth and tenderness in the left dorsal midfoot and right tibial tuberosity. He was diagnosed with sialitis and discharged on Septra for a week. He came back with worsening foot pain was severe now with painful shins on both his left and right feet as seen in the pictures. Her cultures were drawn along with other blood work his labs are significant for acute renal failure with a creatinine now for a lactic acid that was elevated. He was placed on vancomycin and Zosyn. The [facility] ID has suddenly fired and revealed methicillin sensitive Staphylococcus aureus in blood cultures.  He has had a transthoracic echocardiogram which shows 2-D echocardiogram: A medium-size vegetation on the left ventricle your aspect of the aortic valve with severe aortic regurgitation. It also shows a very large vegetation on the right atrial aspect of the tricuspid valve with mild to moderate regurgitation. Pulmonary atrial pressures were 39.    I had narrowed the patient to cefazolin last night but when I learned that he had right and left sided endocarditis and I worried about potential for septic embolization to the brain I decided to change him to an agent that penetrates the brain better namely nafcillin and I'm treating him to that now.  I've also recommended transferred to [facility] where he can be better seen by multidisciplinary team  that would include cardiology nephrology and cardiothoracic surgery as well as infectious disease.   Past Medical History:  Diagnosis Date  . Hepatitis C    s/p treatment with Harvoni  . Heroin overdose 09/05/2016  . IV drug abuse     Past Surgical History:  Procedure Laterality Date  . LEG SURGERY      Social History:  reports that he has been smoking Cigarettes.  He has been smoking about 1.00 pack per day. He has never used smokeless tobacco. He reports that he uses drugs, including Cocaine, IV, and Heroin, about 7 times per week. He reports that he does not drink alcohol.   Family History  Problem Relation Age of Onset  . Leukemia Father   . Alzheimer's disease Mother     No Known Allergies   Medications: I have reviewed patients current medications as documented in Epic Anti-infectives    Start     Dose/Rate Route Frequency Ordered Stop   10/23/16 1630  nafcillin injection 2 g     2 g Intravenous Every 4 hours 10/23/16 1617     10/22/16 2359  vancomycin (VANCOCIN) IVPB 750 mg/150 ml premix  Status:  Discontinued     750 mg 150 mL/hr over 60 Minutes Intravenous Every 24 hours 10/22/16 0336 10/22/16 2045   10/22/16 2200  ceFAZolin (ANCEF) IVPB 2g/100 mL premix  Status:  Discontinued     2 g 200 mL/hr over 30 Minutes Intravenous Every 12 hours 10/22/16 2105 10/23/16 1617   10/22/16  0800  piperacillin-tazobactam (ZOSYN) IVPB 2.25 g  Status:  Discontinued     2.25 g 100 mL/hr over 30 Minutes Intravenous Every 6 hours 10/22/16 0336 10/22/16 0505   10/22/16 0800  piperacillin-tazobactam (ZOSYN) IVPB 3.375 g  Status:  Discontinued     3.375 g 12.5 mL/hr over 240 Minutes Intravenous Every 8 hours 10/22/16 0505 10/22/16 2045   10/22/16 0315  piperacillin-tazobactam (ZOSYN) IVPB 3.375 g  Status:  Discontinued     3.375 g 100 mL/hr over 30 Minutes Intravenous  Once 10/22/16 0302 10/22/16 0304   10/22/16 0315  vancomycin (VANCOCIN) IVPB 1000 mg/200 mL premix  Status:   Discontinued     1,000 mg 200 mL/hr over 60 Minutes Intravenous  Once 10/22/16 0302 10/22/16 0304   10/06/2016 2330  piperacillin-tazobactam (ZOSYN) IVPB 3.375 g     3.375 g 12.5 mL/hr over 240 Minutes Intravenous  Once 10/04/2016 2329 10/22/16 0444   10/05/2016 2330  vancomycin (VANCOCIN) IVPB 1000 mg/200 mL premix     1,000 mg 200 mL/hr over 60 Minutes Intravenous  Once 10/12/2016 2329 10/22/16 0148         ROS:  as in HPI otherwise remainder of 12 point Review of Systems is negative  Blood pressure (!) 119/37, pulse 88, temperature 97.8 F (36.6 C), temperature source Oral, resp. rate 19, height _0  (1.753 m), weight 155 lb (70.3 kg), SpO2 97 %. General: Alert and awake, oriented x3, not in any acute distress. HEENT: anicteric sclera,  EOMI, oropharynx clear and without exudate Cardiovascular: Tachycardic loud murmurs throughout precordium Pulmonary: clear to auscultation bilaterally, no wheezing, rales or rhonchi Gastrointestinal: soft nontender, nondistended, normal bowel sounds, Musculoskeletal/skin:  He has painful erythematous nodules and macules on both feet consistent with Osler's nodes. Does not have clear-cut splinter hemorrhages in his hands or feet  Right foot 10/23/2016:      Left foot 10/23/2016:          Neuro: nonfocal, strength and sensation intact   Results for orders placed or performed during the hospital encounter of 10/09/2016 (from the past 48 hour(s))  Comprehensive metabolic panel     Status: Abnormal   Collection Time: 10/16/2016  8:56 PM  Result Value Ref Range   Sodium 121 (L) 135 - 145 mmol/L   Potassium 5.5 (H) 3.5 - 5.1 mmol/L   Chloride 89 (L) 101 - 111 mmol/L   CO2 20 (L) 22 - 32 mmol/L   Glucose, Bld 114 (H) 65 - 99 mg/dL   BUN 134 (H) 6 - 20 mg/dL    Comment: RESULTS CONFIRMED BY MANUAL DILUTION   Creatinine, Ser 4.34 (H) 0.61 - 1.24 mg/dL   Calcium 8.0 (L) 8.9 - 10.3 mg/dL   Total Protein 6.4 (L) 6.5 - 8.1 g/dL   Albumin 1.8 (L)  3.5 - 5.0 g/dL   AST 91 (H) 15 - 41 U/L   ALT 83 (H) 17 - 63 U/L   Alkaline Phosphatase 162 (H) 38 - 126 U/L   Total Bilirubin 2.8 (H) 0.3 - 1.2 mg/dL   GFR calc non Af Amer 15 (L) >60 mL/min   GFR calc Af Amer 18 (L) >60 mL/min    Comment: (NOTE) The eGFR has been calculated using the CKD EPI equation. This calculation has not been validated in all clinical situations. eGFR's persistently <60 mL/min signify possible Chronic Kidney Disease.    Anion gap 12 5 - 15  CBC with Differential     Status: Abnormal   Collection  Time: 10/26/2016  8:56 PM  Result Value Ref Range   WBC 16.4 (H) 4.0 - 10.5 K/uL   RBC 3.84 (L) 4.22 - 5.81 MIL/uL   Hemoglobin 10.8 (L) 13.0 - 17.0 g/dL   HCT 30.9 (L) 39.0 - 52.0 %   MCV 80.5 78.0 - 100.0 fL   MCH 28.1 26.0 - 34.0 pg   MCHC 35.0 30.0 - 36.0 g/dL   RDW 14.6 11.5 - 15.5 %   Platelets 132 (L) 150 - 400 K/uL   Neutrophils Relative % 86 %   Lymphocytes Relative 8 %   Monocytes Relative 6 %   Eosinophils Relative 0 %   Basophils Relative 0 %   Neutro Abs 14.1 (H) 1.7 - 7.7 K/uL   Lymphs Abs 1.3 0.7 - 4.0 K/uL   Monocytes Absolute 1.0 0.1 - 1.0 K/uL   Eosinophils Absolute 0.0 0.0 - 0.7 K/uL   Basophils Absolute 0.0 0.0 - 0.1 K/uL   WBC Morphology TOXIC GRANULATION     Comment: VACUOLATED NEUTROPHILS INCREASED BANDS (>20% BANDS) MILD LEFT SHIFT (1-5% METAS, OCC MYELO, OCC BANDS)    Smear Review LARGE PLATELETS PRESENT   CG4 I-STAT (Lactic acid)     Status: None   Collection Time: 10/22/2016  9:04 PM  Result Value Ref Range   Lactic Acid, Venous 1.75 0.5 - 1.9 mmol/L  I-Stat CG4 Lactic Acid, ED     Status: None   Collection Time: 10/18/2016  9:05 PM  Result Value Ref Range   Lactic Acid, Venous 1.71 0.5 - 1.9 mmol/L  Blood culture (routine x 2)     Status: Abnormal (Preliminary result)   Collection Time: 10/22/16 12:01 AM  Result Value Ref Range   Specimen Description BLOOD RIGHT ARM    Special Requests BOTTLES DRAWN AEROBIC AND ANAEROBIC 5ML      Culture  Setup Time      GRAM POSITIVE COCCI IN CLUSTERS IN BOTH AEROBIC AND ANAEROBIC BOTTLES CRITICAL VALUE NOTED.  VALUE IS CONSISTENT WITH PREVIOUSLY REPORTED AND CALLED VALUE. Performed at [place] (A)    Report Status PENDING   Blood culture (routine x 2)     Status: None (Preliminary result)   Collection Time: 10/22/16 12:20 AM  Result Value Ref Range   Specimen Description BLOOD LEFT ANTECUBITAL    Special Requests BOTTLES DRAWN AEROBIC AND ANAEROBIC 5ML    Culture  Setup Time      GRAM POSITIVE COCCI IN CLUSTERS IN BOTH AEROBIC AND ANAEROBIC BOTTLES Organism ID to follow CRITICAL RESULT CALLED TO, READ BACK BY AND VERIFIED WITH: Seleta Rhymes [facility] 2017 10/22/16 A BROWNING    Culture      CULTURE REINCUBATED FOR BETTER GROWTH Performed at [facility]    Report Status PENDING   Blood Culture ID Panel (Reflexed)     Status: Abnormal   Collection Time: 10/22/16 12:20 AM  Result Value Ref Range   Enterococcus species NOT DETECTED NOT DETECTED   Listeria monocytogenes NOT DETECTED NOT DETECTED   Staphylococcus species DETECTED (A) NOT DETECTED    Comment: CRITICAL RESULT CALLED TO, READ BACK BY AND VERIFIED WITHSeleta Rhymes [facility] 2017 10/22/16 A BROWNING    Staphylococcus aureus DETECTED (A) NOT DETECTED    Comment: CRITICAL RESULT CALLED TO, READ BACK BY AND VERIFIED WITHSeleta Rhymes [facility] 2017 10/22/16 A BROWNING    Methicillin resistance NOT DETECTED NOT DETECTED   Streptococcus species NOT DETECTED NOT DETECTED  Streptococcus agalactiae NOT DETECTED NOT DETECTED   Streptococcus pneumoniae NOT DETECTED NOT DETECTED   Streptococcus pyogenes NOT DETECTED NOT DETECTED   Acinetobacter baumannii NOT DETECTED NOT DETECTED   Enterobacteriaceae species NOT DETECTED NOT DETECTED   Enterobacter cloacae complex NOT DETECTED NOT DETECTED   Escherichia coli NOT DETECTED NOT DETECTED   Klebsiella oxytoca NOT DETECTED NOT  DETECTED   Klebsiella pneumoniae NOT DETECTED NOT DETECTED   Proteus species NOT DETECTED NOT DETECTED   Serratia marcescens NOT DETECTED NOT DETECTED   Haemophilus influenzae NOT DETECTED NOT DETECTED   Neisseria meningitidis NOT DETECTED NOT DETECTED   Pseudomonas aeruginosa NOT DETECTED NOT DETECTED   Candida albicans NOT DETECTED NOT DETECTED   Candida glabrata NOT DETECTED NOT DETECTED   Candida krusei NOT DETECTED NOT DETECTED   Candida parapsilosis NOT DETECTED NOT DETECTED   Candida tropicalis NOT DETECTED NOT DETECTED    Comment: Performed at [facility]  CK     Status: None   Collection Time: 10/22/16 12:22 AM  Result Value Ref Range   Total CK 219 49 - 397 U/L  I-Stat CG4 Lactic Acid, ED     Status: None   Collection Time: 10/22/16 12:38 AM  Result Value Ref Range   Lactic Acid, Venous 1.31 0.5 - 1.9 mmol/L  HIV antibody     Status: None   Collection Time: 10/22/16  5:27 AM  Result Value Ref Range   HIV Screen 4th Generation wRfx Non Reactive Non Reactive    Comment: (NOTE) Performed At: [facility] [place], [place] [id number] Lindon Romp MD IH:[id number]   Magnesium     Status: Abnormal   Collection Time: 10/22/16  5:27 AM  Result Value Ref Range   Magnesium 2.8 (H) 1.7 - 2.4 mg/dL  Phosphorus     Status: Abnormal   Collection Time: 10/22/16  5:27 AM  Result Value Ref Range   Phosphorus 9.0 (H) 2.5 - 4.6 mg/dL  Comprehensive metabolic panel     Status: Abnormal   Collection Time: 10/22/16  5:27 AM  Result Value Ref Range   Sodium 122 (L) 135 - 145 mmol/L   Potassium 5.4 (H) 3.5 - 5.1 mmol/L   Chloride 92 (L) 101 - 111 mmol/L   CO2 19 (L) 22 - 32 mmol/L   Glucose, Bld 84 65 - 99 mg/dL   BUN 143 (H) 6 - 20 mg/dL    Comment: RESULTS CONFIRMED BY MANUAL DILUTION   Creatinine, Ser 4.31 (H) 0.61 - 1.24 mg/dL   Calcium 7.5 (L) 8.9 - 10.3 mg/dL   Total Protein 5.5 (L) 6.5 - 8.1 g/dL   Albumin 1.6 (L) 3.5 - 5.0 g/dL   AST 61  (H) 15 - 41 U/L   ALT 60 17 - 63 U/L   Alkaline Phosphatase 134 (H) 38 - 126 U/L   Total Bilirubin 3.0 (H) 0.3 - 1.2 mg/dL   GFR calc non Af Amer 15 (L) >60 mL/min   GFR calc Af Amer 18 (L) >60 mL/min    Comment: (NOTE) The eGFR has been calculated using the CKD EPI equation. This calculation has not been validated in all clinical situations. eGFR's persistently <60 mL/min signify possible Chronic Kidney Disease.    Anion gap 11 5 - 15  CBC     Status: Abnormal   Collection Time: 10/22/16  5:27 AM  Result Value Ref Range   WBC 15.7 (H) 4.0 - 10.5 K/uL   RBC 3.63 (L)  4.22 - 5.81 MIL/uL   Hemoglobin 10.3 (L) 13.0 - 17.0 g/dL   HCT 29.1 (L) 39.0 - 52.0 %   MCV 80.2 78.0 - 100.0 fL   MCH 28.4 26.0 - 34.0 pg   MCHC 35.4 30.0 - 36.0 g/dL   RDW 14.8 11.5 - 15.5 %   Platelets 100 (L) 150 - 400 K/uL    Comment: SPECIMEN CHECKED FOR CLOTS REPEATED TO VERIFY PLATELET COUNT CONFIRMED BY SMEAR   Protime-INR     Status: Abnormal   Collection Time: 10/22/16  5:27 AM  Result Value Ref Range   Prothrombin Time 16.6 (H) 11.4 - 15.2 seconds   INR 1.33   Urinalysis, Routine w reflex microscopic     Status: Abnormal   Collection Time: 10/22/16 10:42 PM  Result Value Ref Range   Color, Urine ORANGE (A) YELLOW    Comment: BIOCHEMICALS MAY BE AFFECTED BY COLOR   APPearance TURBID (A) CLEAR   Specific Gravity, Urine 1.020 1.005 - 1.030   pH 5.0 5.0 - 8.0   Glucose, UA NEGATIVE NEGATIVE mg/dL   Hgb urine dipstick LARGE (A) NEGATIVE   Bilirubin Urine SMALL (A) NEGATIVE   Ketones, ur NEGATIVE NEGATIVE mg/dL   Protein, ur NEGATIVE NEGATIVE mg/dL   Nitrite NEGATIVE NEGATIVE   Leukocytes, UA MODERATE (A) NEGATIVE  Sodium, urine, random     Status: None   Collection Time: 10/22/16 10:42 PM  Result Value Ref Range   Sodium, Ur <10 mmol/L    Comment: Performed at [facility]  Rapid urine drug screen (hospital performed)     Status: Abnormal   Collection Time: 10/22/16 10:42 PM  Result  Value Ref Range   Opiates POSITIVE (A) NONE DETECTED   Cocaine POSITIVE (A) NONE DETECTED   Benzodiazepines NONE DETECTED NONE DETECTED   Amphetamines NONE DETECTED NONE DETECTED   Tetrahydrocannabinol POSITIVE (A) NONE DETECTED   Barbiturates NONE DETECTED NONE DETECTED    Comment:        DRUG SCREEN FOR MEDICAL PURPOSES ONLY.  IF CONFIRMATION IS NEEDED FOR ANY PURPOSE, NOTIFY LAB WITHIN 5 DAYS.        LOWEST DETECTABLE LIMITS FOR URINE DRUG SCREEN Drug Class       Cutoff (ng/mL) Amphetamine      1000 Barbiturate      200 Benzodiazepine   182 Tricyclics       993 Opiates          300 Cocaine          300 THC              50   Urine microscopic-add on     Status: Abnormal   Collection Time: 10/22/16 10:42 PM  Result Value Ref Range   Squamous Epithelial / LPF NONE SEEN NONE SEEN   WBC, UA TOO NUMEROUS TO COUNT 0 - 5 WBC/hpf   RBC / HPF 6-30 0 - 5 RBC/hpf   Bacteria, UA MANY (A) NONE SEEN   Urine-Other YEAST PRESENT   Culture, blood (Routine X 2) w Reflex to ID Panel     Status: None (Preliminary result)   Collection Time: 10/22/16 10:59 PM  Result Value Ref Range   Specimen Description BLOOD LEFT ANTECUBITAL    Special Requests BOTTLES DRAWN AEROBIC AND ANAEROBIC 10 CC    Culture      NO GROWTH < 12 HOURS Performed at [facility]    Report Status PENDING   CBC  Status: Abnormal   Collection Time: 10/23/16  5:07 AM  Result Value Ref Range   WBC 8.2 4.0 - 10.5 K/uL   RBC 3.24 (L) 4.22 - 5.81 MIL/uL   Hemoglobin 9.0 (L) 13.0 - 17.0 g/dL   HCT 25.6 (L) 39.0 - 52.0 %   MCV 79.0 78.0 - 100.0 fL   MCH 27.8 26.0 - 34.0 pg   MCHC 35.2 30.0 - 36.0 g/dL   RDW 14.8 11.5 - 15.5 %   Platelets 91 (L) 150 - 400 K/uL    Comment: CONSISTENT WITH PREVIOUS RESULT  Basic metabolic panel     Status: Abnormal   Collection Time: 10/23/16  5:07 AM  Result Value Ref Range   Sodium 125 (L) 135 - 145 mmol/L   Potassium 5.6 (H) 3.5 - 5.1 mmol/L   Chloride 99 (L) 101 - 111  mmol/L   CO2 15 (L) 22 - 32 mmol/L   Glucose, Bld 139 (H) 65 - 99 mg/dL   BUN 153 (H) 6 - 20 mg/dL    Comment: RESULTS CONFIRMED BY MANUAL DILUTION   Creatinine, Ser 4.12 (H) 0.61 - 1.24 mg/dL   Calcium 7.4 (L) 8.9 - 10.3 mg/dL   GFR calc non Af Amer 16 (L) >60 mL/min   GFR calc Af Amer 19 (L) >60 mL/min    Comment: (NOTE) The eGFR has been calculated using the CKD EPI equation. This calculation has not been validated in all clinical situations. eGFR's persistently <60 mL/min signify possible Chronic Kidney Disease.    Anion gap 11 5 - 15   _0 (sdes,specrequest,cult,reptstatus)   ) Recent Results (from the past 720 hour(s))  Blood culture (routine x 2)     Status: Abnormal (Preliminary result)   Collection Time: 10/22/16 12:01 AM  Result Value Ref Range Status   Specimen Description BLOOD RIGHT ARM  Final   Special Requests BOTTLES DRAWN AEROBIC AND ANAEROBIC 5ML  Final   Culture  Setup Time   Final    GRAM POSITIVE COCCI IN CLUSTERS IN BOTH AEROBIC AND ANAEROBIC BOTTLES CRITICAL VALUE NOTED.  VALUE IS CONSISTENT WITH PREVIOUSLY REPORTED AND CALLED VALUE. Performed at [place] (A)  Final   Report Status PENDING  Incomplete  Blood culture (routine x 2)     Status: None (Preliminary result)   Collection Time: 10/22/16 12:20 AM  Result Value Ref Range Status   Specimen Description BLOOD LEFT ANTECUBITAL  Final   Special Requests BOTTLES DRAWN AEROBIC AND ANAEROBIC 5ML  Final   Culture  Setup Time   Final    GRAM POSITIVE COCCI IN CLUSTERS IN BOTH AEROBIC AND ANAEROBIC BOTTLES Organism ID to follow CRITICAL RESULT CALLED TO, READ BACK BY AND VERIFIED WITHSeleta Rhymes [facility] 2017 10/22/16 A BROWNING    Culture   Final    CULTURE REINCUBATED FOR BETTER GROWTH Performed at [facility]    Report Status PENDING  Incomplete  Blood Culture ID Panel (Reflexed)     Status: Abnormal   Collection Time: 10/22/16 12:20  AM  Result Value Ref Range Status   Enterococcus species NOT DETECTED NOT DETECTED Final   Listeria monocytogenes NOT DETECTED NOT DETECTED Final   Staphylococcus species DETECTED (A) NOT DETECTED Final    Comment: CRITICAL RESULT CALLED TO, READ BACK BY AND VERIFIED WITH: E JACKSON PHARMD 2017 10/22/16 A BROWNING    Staphylococcus aureus DETECTED (A) NOT DETECTED Final    Comment: CRITICAL RESULT CALLED TO,  READ BACK BY AND VERIFIED WITH: Seleta Rhymes [facility] 2017 10/22/16 A BROWNING    Methicillin resistance NOT DETECTED NOT DETECTED Final   Streptococcus species NOT DETECTED NOT DETECTED Final   Streptococcus agalactiae NOT DETECTED NOT DETECTED Final   Streptococcus pneumoniae NOT DETECTED NOT DETECTED Final   Streptococcus pyogenes NOT DETECTED NOT DETECTED Final   Acinetobacter baumannii NOT DETECTED NOT DETECTED Final   Enterobacteriaceae species NOT DETECTED NOT DETECTED Final   Enterobacter cloacae complex NOT DETECTED NOT DETECTED Final   Escherichia coli NOT DETECTED NOT DETECTED Final   Klebsiella oxytoca NOT DETECTED NOT DETECTED Final   Klebsiella pneumoniae NOT DETECTED NOT DETECTED Final   Proteus species NOT DETECTED NOT DETECTED Final   Serratia marcescens NOT DETECTED NOT DETECTED Final   Haemophilus influenzae NOT DETECTED NOT DETECTED Final   Neisseria meningitidis NOT DETECTED NOT DETECTED Final   Pseudomonas aeruginosa NOT DETECTED NOT DETECTED Final   Candida albicans NOT DETECTED NOT DETECTED Final   Candida glabrata NOT DETECTED NOT DETECTED Final   Candida krusei NOT DETECTED NOT DETECTED Final   Candida parapsilosis NOT DETECTED NOT DETECTED Final   Candida tropicalis NOT DETECTED NOT DETECTED Final    Comment: Performed at [facility]  Culture, blood (Routine X 2) w Reflex to ID Panel     Status: None (Preliminary result)   Collection Time: 10/22/16 10:59 PM  Result Value Ref Range Status   Specimen Description BLOOD LEFT ANTECUBITAL  Final    Special Requests BOTTLES DRAWN AEROBIC AND ANAEROBIC 10 CC  Final   Culture   Final    NO GROWTH < 12 HOURS Performed at [facility]    Report Status PENDING  Incomplete     Impression/Recommendation  Principal Problem:   Cellulitis Active Problems:   Hyperkalemia   Elevated liver enzymes   Acute renal failure (HCC)   Hepatitis C antibody test positive   Tobacco abuse   Hyponatremia   Hyperglycemia   Hypoalbuminemia due to protein-calorie malnutrition (HCC)   Normocytic anemia   Thrombocytopenia (HCC)   Joel Sheppard is a 45 y.o. 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 He has Osler's nodes on exam as well.  Repeat blood cultures were drawn late last night we may need to get another set tomorrow since staph aureus will be slow to  Clear  Change to nafcillin for better central nervous system penetration and KC has emboli to the brain  Transfer  to [facility] for better multidisciplinary care with nephrology cardiology and cardiothoracic surgery on site  Do not place PICC line or central line until we have documented clearance of his bacteremia. I anticipate thismight l take likely 4-5 days to occur.  #2 acute renal failure could be due partly to prerenal failure setting of sepsis but I suspect he is also embolizing to his kidneys  #3 active IV drug use will recheck him for HIV hepatitis B and hepatitis C RNA. He tells me that he will be able to stop but he will clearly need intense help in stopping this dangerous  habit  #4 history of chronic hepatitis C infection the past status post treatment with HARVONI hopefully is not reacquired infection     10/23/2016, 4:17 PM   Thank you so much for this interesting consult  [place] for [place] [phone] (pager) [phone] (office) 10/23/2016, 4:17 PM  Rhina [place] 10/23/2016, 4:17 PM

## 2016-10-24 ENCOUNTER — Inpatient Hospital Stay (HOSPITAL_COMMUNITY): Payer: Medicaid Other

## 2016-10-24 ENCOUNTER — Other Ambulatory Visit (HOSPITAL_COMMUNITY): Payer: Self-pay

## 2016-10-24 DIAGNOSIS — D696 Thrombocytopenia, unspecified: Secondary | ICD-10-CM

## 2016-10-24 DIAGNOSIS — Z9981 Dependence on supplemental oxygen: Secondary | ICD-10-CM

## 2016-10-24 DIAGNOSIS — E875 Hyperkalemia: Secondary | ICD-10-CM

## 2016-10-24 DIAGNOSIS — R0902 Hypoxemia: Secondary | ICD-10-CM

## 2016-10-24 LAB — CBC
HCT: 25.4 % — ABNORMAL LOW (ref 39.0–52.0)
Hemoglobin: 8.8 g/dL — ABNORMAL LOW (ref 13.0–17.0)
MCH: 27.8 pg (ref 26.0–34.0)
MCHC: 34.6 g/dL (ref 30.0–36.0)
MCV: 80.4 fL (ref 78.0–100.0)
PLATELETS: 126 10*3/uL — AB (ref 150–400)
RBC: 3.16 MIL/uL — ABNORMAL LOW (ref 4.22–5.81)
RDW: 14.8 % (ref 11.5–15.5)
WBC: 9.3 10*3/uL (ref 4.0–10.5)

## 2016-10-24 LAB — BLOOD GAS, ARTERIAL
ACID-BASE DEFICIT: 8.5 mmol/L — AB (ref 0.0–2.0)
BICARBONATE: 15.3 mmol/L — AB (ref 20.0–28.0)
DRAWN BY: 205171
O2 CONTENT: 2 L/min
O2 SAT: 93 %
PH ART: 7.405 (ref 7.350–7.450)
Patient temperature: 98.6
pCO2 arterial: 25 mmHg — ABNORMAL LOW (ref 32.0–48.0)
pO2, Arterial: 70 mmHg — ABNORMAL LOW (ref 83.0–108.0)

## 2016-10-24 LAB — COMPREHENSIVE METABOLIC PANEL
ALK PHOS: 106 U/L (ref 38–126)
ALT: 41 U/L (ref 17–63)
AST: 65 U/L — AB (ref 15–41)
Albumin: 1.2 g/dL — ABNORMAL LOW (ref 3.5–5.0)
Anion gap: 11 (ref 5–15)
BILIRUBIN TOTAL: 1.9 mg/dL — AB (ref 0.3–1.2)
BUN: 156 mg/dL — AB (ref 6–20)
CALCIUM: 7.4 mg/dL — AB (ref 8.9–10.3)
CO2: 14 mmol/L — ABNORMAL LOW (ref 22–32)
CREATININE: 3.08 mg/dL — AB (ref 0.61–1.24)
Chloride: 105 mmol/L (ref 101–111)
GFR calc Af Amer: 27 mL/min — ABNORMAL LOW (ref >60)
GFR, EST NON AFRICAN AMERICAN: 23 mL/min — AB (ref >60)
Glucose, Bld: 112 mg/dL — ABNORMAL HIGH (ref 65–99)
Potassium: 5.2 mmol/L — ABNORMAL HIGH (ref 3.5–5.1)
Sodium: 130 mmol/L — ABNORMAL LOW (ref 135–145)
TOTAL PROTEIN: 5 g/dL — AB (ref 6.5–8.1)

## 2016-10-24 LAB — URINE CULTURE: Culture: 70000 — AB

## 2016-10-24 MED ORDER — SODIUM BICARBONATE 650 MG PO TABS
650.0000 mg | ORAL_TABLET | Freq: Three times a day (TID) | ORAL | Status: DC
  Administered 2016-10-24 – 2016-10-26 (×7): 650 mg via ORAL
  Filled 2016-10-24 (×9): qty 1

## 2016-10-24 MED ORDER — FUROSEMIDE 10 MG/ML IJ SOLN
80.0000 mg | Freq: Once | INTRAMUSCULAR | Status: AC
  Administered 2016-10-24: 80 mg via INTRAVENOUS
  Filled 2016-10-24: qty 8

## 2016-10-24 MED ORDER — SODIUM POLYSTYRENE SULFONATE 15 GM/60ML PO SUSP
30.0000 g | Freq: Once | ORAL | Status: AC
  Administered 2016-10-24: 30 g via ORAL
  Filled 2016-10-24: qty 120

## 2016-10-24 NOTE — Progress Notes (Signed)
Patient complains of SOB and states"I feel like I am suffocating!" V/S include: 101, 24, 112/37, 98% on 2l. MD notified. MD assessed patient at bedside. No orders given at this time. Will continue to assess.

## 2016-10-24 NOTE — Progress Notes (Signed)
SPOKE TO MD ABOUT PT C/O  UNCONTROLLED DIARRHEA .  DIARRHEA APPEARS TO BE MORE NOTICEABLE AFTER RECEIVING ANTIBIOTICS .  SHE STATES TO PASS ON TO DAY TEAM TO R/O CDIFF . NO IMMODIUM ORDER GIVEN . PT KE-PT CONFORTABLE AND FREQUENTLY CHANGED . NO OTHER INDICATION NOTED .

## 2016-10-24 NOTE — Progress Notes (Signed)
Subjective:  Worsening dyspnea, "I can't get enough oxygen on 2 L"   Antibiotics:  Anti-infectives    Start     Dose/Rate Route Frequency Ordered Stop   10/23/16 1700  nafcillin injection 2 g  Status:  Discontinued     2 g Intravenous Every 4 hours 10/23/16 1617 10/23/16 1655   10/23/16 1700  nafcillin 2 g in dextrose 5 % 100 mL IVPB     2 g 200 mL/hr over 30 Minutes Intravenous Every 4 hours 10/23/16 1655     10/22/16 2359  vancomycin (VANCOCIN) IVPB 750 mg/150 ml premix  Status:  Discontinued     750 mg 150 mL/hr over 60 Minutes Intravenous Every 24 hours 10/22/16 0336 10/22/16 2045   10/22/16 2200  ceFAZolin (ANCEF) IVPB 2g/100 mL premix  Status:  Discontinued     2 g 200 mL/hr over 30 Minutes Intravenous Every 12 hours 10/22/16 2105 10/23/16 1617   10/22/16 0800  piperacillin-tazobactam (ZOSYN) IVPB 2.25 g  Status:  Discontinued     2.25 g 100 mL/hr over 30 Minutes Intravenous Every 6 hours 10/22/16 0336 10/22/16 0505   10/22/16 0800  piperacillin-tazobactam (ZOSYN) IVPB 3.375 g  Status:  Discontinued     3.375 g 12.5 mL/hr over 240 Minutes Intravenous Every 8 hours 10/22/16 0505 10/22/16 2045   10/22/16 0315  piperacillin-tazobactam (ZOSYN) IVPB 3.375 g  Status:  Discontinued     3.375 g 100 mL/hr over 30 Minutes Intravenous  Once 10/22/16 0302 10/22/16 0304   10/22/16 0315  vancomycin (VANCOCIN) IVPB 1000 mg/200 mL premix  Status:  Discontinued     1,000 mg 200 mL/hr over 60 Minutes Intravenous  Once 10/22/16 0302 10/22/16 0304   10/17/2016 2330  piperacillin-tazobactam (ZOSYN) IVPB 3.375 g     3.375 g 12.5 mL/hr over 240 Minutes Intravenous  Once 10/09/2016 2329 10/22/16 0444   10/10/2016 2330  vancomycin (VANCOCIN) IVPB 1000 mg/200 mL premix     1,000 mg 200 mL/hr over 60 Minutes Intravenous  Once 10/11/2016 2329 10/22/16 0148      Medications: Scheduled Meds: . nafcillin IV  2 g Intravenous Q4H  . nicotine  14 mg Transdermal Daily  . pantoprazole  40 mg Oral  Daily  . predniSONE  40 mg Oral Q breakfast  . protein supplement shake  11 oz Oral BID BM  . sodium bicarbonate  650 mg Oral TID  . sodium chloride flush  3 mL Intravenous Q12H   Continuous Infusions: PRN Meds:.acetaminophen **OR** acetaminophen, gi cocktail, oxyCODONE, polyethylene glycol, sodium chloride    Objective: Weight change:   Intake/Output Summary (Last 24 hours) at 10/24/16 1700 Last data filed at 10/24/16 1402  Gross per 24 hour  Intake             3730 ml  Output             4475 ml  Net             -745 ml   Blood pressure (!) 112/37, pulse (!) 101, temperature 98.1 F (36.7 C), temperature source Oral, resp. rate (!) 24, height 5\' 9"  (1.753 m), weight 166 lb 3.6 oz (75.4 kg), SpO2 97 %. Temp:  [98.1 F (36.7 C)] 98.1 F (36.7 C) (11/23 2047) Pulse Rate:  [90-101] 101 (11/24 0841) Resp:  [19-24] 24 (11/24 0841) BP: (112-120)/(37-38) 112/37 (11/24 0841) SpO2:  [97 %-98 %] 97 % (11/24 1432) Weight:  [166 lb 3.6 oz (75.4  kg)] 166 lb 3.6 oz (75.4 kg) (11/23 2047)  Physical Exam: General: Alert and awake, oriented x3, Respiratory distress using accessory muscles HEENT: anicteric sclera,  EOMI, oropharynx clear and without exudate Cardiovascular: Tachycardic loud murmurs esp rusb to carotid Pulmonary: His breath sounds at the bases  Gastrointestinal: soft nontender, nondistended, normal bowel sounds, Musculoskeletal/skin:  He has painful erythematous nodules and macules on both feet consistent with Osler's nodes. Does not have clear-cut splinter hemorrhages in his hands or feet  Right foot 10/23/2016:       10/24/16:        Left foot 10/23/2016:        10/24/16:        Neuro: nonfocal, strength and sensation intact General: Alert and awake, oriented x3, not in any acute distress. HEENT: anicteric sclera,  EOMI, oropharynx clear and without exudate Cardiovascular: Tachycardic loud murmurs throughout  precordium Pulmonary: clear to auscultation bilaterally, no wheezing, rales or rhonchi Gastrointestinal: soft nontender, nondistended, normal bowel sounds, Musculoskeletal/skin:  He has painful erythematous nodules and macules on both feet consistent with Osler's nodes. Does not have clear-cut splinter hemorrhages in his hands or feet  Right foot 10/23/2016:      Left foot 10/23/2016:          Neuro: nonfocal, strength and sensation intact   CBC: @LABBLAST3 (wbc3,Hgb:3,Hct:3,Plt:3,INR:3APTT:3)@   BMET  Recent Labs  10/23/16 0507 10/24/16 0349  NA 125* 130*  K 5.6* 5.2*  CL 99* 105  CO2 15* 14*  GLUCOSE 139* 112*  BUN 153* 156*  CREATININE 4.12* 3.08*  CALCIUM 7.4* 7.4*     Liver Panel   Recent Labs  10/22/16 0527 10/24/16 0349  PROT 5.5* 5.0*  ALBUMIN 1.6* 1.2*  AST 61* 65*  ALT 60 41  ALKPHOS 134* 106  BILITOT 3.0* 1.9*       Sedimentation Rate No results for input(s): ESRSEDRATE in the last 72 hours. C-Reactive Protein No results for input(s): CRP in the last 72 hours.  Micro Results: Recent Results (from the past 720 hour(s))  Blood culture (routine x 2)     Status: Abnormal (Preliminary result)   Collection Time: 10/22/16 12:01 AM  Result Value Ref Range Status   Specimen Description BLOOD RIGHT ARM  Final   Special Requests BOTTLES DRAWN AEROBIC AND ANAEROBIC  Final   Culture  Setup Time   Final    GRAM POSITIVE COCCI IN CLUSTERS IN BOTH AEROBIC AND ANAEROBIC BOTTLES CRITICAL VALUE NOTED.  VALUE IS CONSISTENT WITH PREVIOUSLY REPORTED AND CALLED VALUE.    Culture STAPHYLOCOCCUS AUREUS (A)  Final   Report Status PENDING  Incomplete   Organism ID, Bacteria STAPHYLOCOCCUS AUREUS  Final      Susceptibility   Staphylococcus aureus - MIC*    CIPROFLOXACIN <=0.5 SENSITIVE Sensitive     ERYTHROMYCIN <=0.25 SENSITIVE Sensitive     GENTAMICIN <=0.5 SENSITIVE Sensitive     OXACILLIN <=0.25 SENSITIVE Sensitive      TETRACYCLINE <=1 SENSITIVE Sensitive     VANCOMYCIN 1 SENSITIVE Sensitive     TRIMETH/SULFA <=10 SENSITIVE Sensitive     CLINDAMYCIN <=0.25 SENSITIVE Sensitive     RIFAMPIN <=0.5 SENSITIVE Sensitive     Inducible Clindamycin Value in next row Sensitive      [facility]    * STAPHYLOCOCCUS AUREUS  Blood culture (routine x 2)     Status: Abnormal (Preliminary result)   Collection Time: 10/22/16 12:20 AM  Result Value Ref Range Status   Specimen  Description BLOOD LEFT ANTECUBITAL  Final   Special Requests BOTTLES DRAWN AEROBIC AND ANAEROBIC 5ML  Final   Culture  Setup Time   Final    GRAM POSITIVE COCCI IN CLUSTERS IN BOTH AEROBIC AND ANAEROBIC BOTTLES CRITICAL RESULT CALLED TO, READ BACK BY AND VERIFIED WITHErling Cruz: E [facility]SON [facility] 10/22/16 A BROWNING [facility]    Culture STAPHYLOCOCCUS AUREUS (A)  Final   Report Status PENDING  Incomplete  Blood Culture ID Panel (Reflexed)     Status: Abnormal   Collection Time: 10/22/16 12:20 AM  Result Value Ref Range Status   Enterococcus species NOT DETECTED NOT DETECTED Final   Listeria monocytogenes NOT DETECTED NOT DETECTED Final   Staphylococcus species DETECTED (A) NOT DETECTED Final    Comment: CRITICAL RESULT CALLED TO, READ BACK BY AND VERIFIED WITH: E JACKSON PHARMD 2017 10/22/16 A BROWNING    Staphylococcus aureus DETECTED (A) NOT DETECTED Final    Comment: CRITICAL RESULT CALLED TO, READ BACK BY AND VERIFIED WITH: Erling Cruz JACKSON [facility] 10/22/16 A BROWNING    Methicillin resistance NOT DETECTED NOT DETECTED Final   Streptococcus species NOT DETECTED NOT DETECTED Final   Streptococcus agalactiae NOT DETECTED NOT DETECTED Final   Streptococcus pneumoniae NOT DETECTED NOT DETECTED Final   Streptococcus pyogenes NOT DETECTED NOT DETECTED Final   Acinetobacter baumannii NOT DETECTED NOT DETECTED Final   Enterobacteriaceae species NOT DETECTED NOT DETECTED Final   Enterobacter cloacae  complex NOT DETECTED NOT DETECTED Final   Escherichia coli NOT DETECTED NOT DETECTED Final   Klebsiella oxytoca NOT DETECTED NOT DETECTED Final   Klebsiella pneumoniae NOT DETECTED NOT DETECTED Final   Proteus species NOT DETECTED NOT DETECTED Final   Serratia marcescens NOT DETECTED NOT DETECTED Final   Haemophilus influenzae NOT DETECTED NOT DETECTED Final   Neisseria meningitidis NOT DETECTED NOT DETECTED Final   Pseudomonas aeruginosa NOT DETECTED NOT DETECTED Final   Candida albicans NOT DETECTED NOT DETECTED Final   Candida glabrata NOT DETECTED NOT DETECTED Final   Candida krusei NOT DETECTED NOT DETECTED Final   Candida parapsilosis NOT DETECTED NOT DETECTED Final   Candida tropicalis NOT DETECTED NOT DETECTED Final    Comment: [facility]  Culture, blood (Routine X 2) w Reflex to ID Panel     Status: None (Preliminary result)   Collection Time: 10/22/16 10:59 PM  Result Value Ref Range Status   Specimen Description BLOOD LEFT ANTECUBITAL  Final   Special Requests BOTTLES DRAWN AEROBIC AND ANAEROBIC 10 CC  Final   Culture   Final    NO GROWTH 1 DAY [facility]    Report Status PENDING  Incomplete  Culture, Urine     Status: Abnormal   Collection Time: 10/22/16 10:59 PM  Result Value Ref Range Status   Specimen Description URINE, CLEAN CATCH  Final   Special Requests NONE  Final   Culture (A)  Final    70,000 COLONIES/mL YEAST [facility]    Report Status 10/24/2016 FINAL  Final    Studies/Results: [place] Renal  Result Date: 10/24/2016 CLINICAL DATA:  45 y/o  M; acute renal failure. EXAM: RENAL / URINARY TRACT ULTRASOUND COMPLETE COMPARISON:  None. FINDINGS: Right Kidney: Length: 12.8 cm. Echogenicity within normal limits. No hydronephrosis. Lower pole well-circumscribed anechoic 1.2 x 1.2 x 1.3 cm lesion with enhanced through transmission and no appreciable vascularity on color Doppler compatible with simple  cyst. Left Kidney: Length:  12.4 cm. Echogenicity within normal limits. No mass or hydronephrosis visualized. Bladder: Foley catheter in situ. Bilateral pleural effusions partially visualized. Trace perihepatic ascites. IMPRESSION: 1. No mass or hydronephrosis of the kidneys. 2. Bilateral pleural effusions and trace perihepatic ascites. Electronically Signed   By: Mitzi Hansen M.D.   On: 10/24/2016 00:41   Ct Foot Right Wo Contrast  Result Date: 10/23/2016 CLINICAL DATA:  Erythema, warmth, and tenderness of the dorsal left midfoot. Cellulitis. Increasing pain. EXAM: CT OF THE RIGHT FOOT WITHOUT CONTRAST TECHNIQUE: Multidetector CT imaging of the right foot was performed according to the standard protocol. Multiplanar CT image reconstructions were also generated. COMPARISON:  Radiographs dated 10/06/2016 FINDINGS: Bones/Joint/Cartilage There small marginal osteophytes on the distal tibia at the ankle joint. The bones of the foot appear normal. There are no appreciable joint effusions. There is nonspecific subcutaneous edema around the ankle and on the dorsum of the foot most prominent over the midfoot but there is no definable abscess. No evidence of osteomyelitis. IMPRESSION: Nonspecific subcutaneous edema around the ankle and foot. The findings could represent cellulitis. No evidence of abscess or osteomyelitis or joint effusions. Electronically Signed   By: Francene Boyers M.D.   On: 10/23/2016 13:52   Dg Chest Port 1 View  Result Date: 10/24/2016 CLINICAL DATA:  Dyspnea. EXAM: PORTABLE CHEST 1 VIEW COMPARISON:  09/06/2016 FINDINGS: Development of bilateral patchy airspace disease, right side greater than left. The most confluent airspace disease is near the right lung apex and the right lung base. Heart size is stable and within normal limits for size. The trachea is midline. Probable left pleural effusion. Cannot exclude a small right pleural effusion. IMPRESSION: Development of bilateral  airspace disease, right side greater than left. Differential diagnosis would include diffuse pneumonia versus asymmetric pulmonary edema. Probable small pleural effusions, left side greater than right. Electronically Signed   By: Richarda Overlie M.D.   On: 10/24/2016 09:39      Assessment/Plan:  INTERVAL HISTORY: With worsening respiratory distress   Principal Problem:   Cellulitis Active Problems:   Hyperkalemia   Elevated liver enzymes   Acute renal failure (HCC)   Hepatitis C antibody test positive   Tobacco abuse   Hyponatremia   Hyperglycemia   Hypoalbuminemia due to protein-calorie malnutrition (HCC)   Normocytic anemia   Thrombocytopenia (HCC)   IVDU (intravenous drug user)   History of hepatitis C   Staphylococcus aureus bacteremia with sepsis (HCC)   Endocarditis of tricuspid valve   Aortic valve endocarditis   Severe aortic regurgitation   Severe tricuspid regurgitation   Osler's node   Septic embolism (HCC)    Joel Sheppard is a 45 y.o. male with   With relapsed IV drug use that is ongoing and methicillin sensitive staph aureus bacteremia with both right and left sided endocarditis with severe aortic regurgitation and moderate to severe tricuspid regurgitation with significant sized vegetations on both valves, Osler's nodes and concern for embolization to the kidneys given his creatinine is now 4 range. Not clearing his blood cultures yet in his respiratory distress is worsening  #1 Right and left sided endocarditis with concern for septic embolization to the kidney and also other potential organs. He has Osler's nodes on exam as well.  Repeat blood cultures yet again  Try to keep him without a central line until we can clear his blood cultures of this may not be possible if he worsens clinically.  If not he will then later need a "central  line holiday.  He needs a transesophageal echocardiogram along with formal cardiology and cardiothoracic surgery  consult.  I would also strongly consider involving  CCM  Given his resp status  #2 respiratory distress: could be multifactorial with renal failure and volume overload playing a role. Also would not doubt that he is embolizing his tricuspid vegetation into his lungs. ABG  showed significant hypoxemia  2 acute renal failure could be due partly to prerenal failure setting of sepsis but I suspect he is also embolizing to his kidneys  #3 active IV drug use will recheck him for HIV hepatitis B and hepatitis C RNA. He tells me that he will be able to stop but he will clearly need intense help in stopping this dangerous habit  #4 history of chronic hepatitis C infection the past status post treatment with HARVONI hopefully is not reacquired infection  I spent greater than 35 minutes with the patient including greater than 50% of time in face to face counsel of the patient and his right and left sided endocarditis with likely septic emboli to lungs and kidneys his IV drug use respiratory failure and in coordination of his care.   LOS: 3 days   Acey LavCornelius Van Dam 10/24/2016, 5:00 PM

## 2016-10-24 NOTE — Progress Notes (Signed)
[facility] PROGRESS NOTE  Joel Sheppard:[id number] DOB: 1971-11-02 DOA: 26-Oct-2016  PCP: Pcp Not In System  Brief History/Interval Summary: 45 year old Caucasian male with a past medical history of intravenous drug use, last use just prior to admission, hepatitis C status post treatment with her morning, recent hospitalization in October for heroin overdose presented with findings suggestive of cellulitis involving the left foot and right tibial tuberosity. He was initially treated with oral antibiotics with which he did not improve and actually had worsening foot pain. He was hospitalized for further management.  Reason for Visit: Lower extremity cellulitis. Endocarditis.  Consultants: Infectious disease. Nephrology.  Procedures:  Transthoracic echocardiogram Study Conclusions - Left ventricle: The cavity size was normal. Wall thickness was   normal. Systolic function was normal. The estimated ejection   fraction was in the range of 60% to 65%. Wall motion was normal;   there were no regional wall motion abnormalities. - Aortic valve: There was a medium-sized vegetation on the left   ventricular aspect. There was severe regurgitation. Valve area   (VTI): 2.11 cm^2. Valve area (Vmax): 2.01 cm^2. Valve area   (Vmean): 1.92 cm^2. - Tricuspid valve: There was a large vegetation on the right atrial   aspect. There was mild-moderate regurgitation. - Pulmonary arteries: Systolic pressure was mildly increased. PA   peak pressure: 39 mm Hg (S). Impressions: - Normal LV function   There is evidence of aortic valve endocarditis and tricuspid   valve endocarditis   Antibiotics: Previously, he has been on vancomycin, Zosyn, cefepime Currently on nafcillin  Subjective/Interval History: Patient complains of shortness of breath this morning. Does have a cough without any sputum production. He states that he has pain all over. Denies any chest pain, specifically. Frustrated  that he has not been able to undergo his procedure yet.  ROS: Denies any nausea or vomiting  Objective:  Vital Signs  Vitals:   10/23/16 0612 10/23/16 1501 10/23/16 2047 10/24/16 0841  BP: (!) 109/41 (!) 119/37 (!) 120/38 (!) 112/37  Pulse: 83 88 90 (!) 101  Resp: 18 19 19  (!) 24  Temp: 97.7 F (36.5 C) 97.8 F (36.6 C) 98.1 F (36.7 C)   TempSrc: Oral Oral Oral   SpO2: 97% 97% 97% 98%  Weight:   75.4 kg (166 lb 3.6 oz)   Height:   5\' 9"  (1.753 m)     Intake/Output Summary (Last 24 hours) at 10/24/16 1348 Last data filed at 10/24/16 1223  Gross per 24 hour  Intake             3730 ml  Output             3775 ml  Net              -45 ml   Filed Weights   10/22/16 0239 10/23/16 2047  Weight: 70.3 kg (155 lb) 75.4 kg (166 lb 3.6 oz)    General appearance: alert, cooperative, appears stated age and no distress Resp: Coarse breath sounds bilaterally. Few crackles at the bases. No wheezing or rhonchi. Cardio: regular rate and rhythm, S1, S2 normal, no murmur, click, rub or gallop GI: soft, non-tender; bowel sounds normal; no masses,  no organomegaly Extremities: Bilateral edema in the lower extremities Neurologic: Awake, alert. Oriented 3. No obvious focal neurological deficits.  Lab Results:  Data Reviewed: I have personally reviewed following labs and imaging studies  CBC:  Recent Labs Lab 26-Oct-2016 2056 10/22/16 0527 10/23/16 0507 10/24/16 8119  WBC 16.4* 15.7* 8.2 9.3  NEUTROABS 14.1*  --   --   --   HGB 10.8* 10.3* 9.0* 8.8*  HCT 30.9* 29.1* 25.6* 25.4*  MCV 80.5 80.2 79.0 80.4  PLT 132* 100* 91* 126*    Basic Metabolic Panel:  Recent Labs Lab 10/03/2016 2056 10/22/16 0527 10/23/16 0507 10/24/16 0349  NA 121* 122* 125* 130*  K 5.5* 5.4* 5.6* 5.2*  CL 89* 92* 99* 105  CO2 20* 19* 15* 14*  GLUCOSE 114* 84 139* 112*  BUN 134* 143* 153* 156*  CREATININE 4.34* 4.31* 4.12* 3.08*  CALCIUM 8.0* 7.5* 7.4* 7.4*  MG  --  2.8*  --   --   PHOS  --  9.0*   --   --     GFR: Estimated Creatinine Clearance: 30.3 mL/min (by C-G formula based on SCr of 3.08 mg/dL (H)).  Liver Function Tests:  Recent Labs Lab 10/29/2016 2056 10/22/16 0527 10/24/16 0349  AST 91* 61* 65*  ALT 83* 60 41  ALKPHOS 162* 134* 106  BILITOT 2.8* 3.0* 1.9*  PROT 6.4* 5.5* 5.0*  ALBUMIN 1.8* 1.6* 1.2*    Coagulation Profile:  Recent Labs Lab 10/22/16 0527  INR 1.33    Cardiac Enzymes:  Recent Labs Lab 10/22/16 0022  CKTOTAL 219     Recent Results (from the past 240 hour(s))  Blood culture (routine x 2)     Status: Abnormal (Preliminary result)   Collection Time: 10/22/16 12:01 AM  Result Value Ref Range Status   Specimen Description BLOOD RIGHT ARM  Final   Special Requests BOTTLES DRAWN AEROBIC AND ANAEROBIC 5ML  Final   Culture  Setup Time   Final    GRAM POSITIVE COCCI IN CLUSTERS IN BOTH AEROBIC AND ANAEROBIC BOTTLES CRITICAL VALUE NOTED.  VALUE IS CONSISTENT WITH PREVIOUSLY REPORTED AND CALLED VALUE.    Culture STAPHYLOCOCCUS AUREUS (A)  Final   Report Status PENDING  Incomplete   Organism ID, Bacteria STAPHYLOCOCCUS AUREUS  Final      Susceptibility   Staphylococcus aureus - MIC*    CIPROFLOXACIN <=0.5 SENSITIVE Sensitive     ERYTHROMYCIN <=0.25 SENSITIVE Sensitive     GENTAMICIN <=0.5 SENSITIVE Sensitive     OXACILLIN <=0.25 SENSITIVE Sensitive     TETRACYCLINE <=1 SENSITIVE Sensitive     VANCOMYCIN 1 SENSITIVE Sensitive     TRIMETH/SULFA <=10 SENSITIVE Sensitive     CLINDAMYCIN <=0.25 SENSITIVE Sensitive     RIFAMPIN <=0.5 SENSITIVE Sensitive     Inducible Clindamycin Value in next row Sensitive      NEGATIVEPerformed at [facility]    * STAPHYLOCOCCUS AUREUS  Blood culture (routine x 2)     Status: Abnormal (Preliminary result)   Collection Time: 10/22/16 12:20 AM  Result Value Ref Range Status   Specimen Description BLOOD LEFT ANTECUBITAL  Final   Special Requests BOTTLES DRAWN AEROBIC AND ANAEROBIC 5ML  Final    Culture  Setup Time   Final    GRAM POSITIVE COCCI IN CLUSTERS IN BOTH AEROBIC AND ANAEROBIC BOTTLES CRITICAL RESULT CALLED TO, READ BACK BY AND VERIFIED WITHErling Cruz: E JACKSON [facility] 2017 10/22/16 A BROWNING [facility]    Culture STAPHYLOCOCCUS AUREUS (A)  Final   Report Status PENDING  Incomplete  Blood Culture ID Panel (Reflexed)     Status: Abnormal   Collection Time: 10/22/16 12:20 AM  Result Value Ref Range Status   Enterococcus species NOT DETECTED NOT DETECTED Final   Listeria  monocytogenes NOT DETECTED NOT DETECTED Final   Staphylococcus species DETECTED (A) NOT DETECTED Final    Comment: CRITICAL RESULT CALLED TO, READ BACK BY AND VERIFIED WITH: E JACKSON PHARMD 2017 10/22/16 A BROWNING    Staphylococcus aureus DETECTED (A) NOT DETECTED Final    Comment: CRITICAL RESULT CALLED TO, READ BACK BY AND VERIFIED WITH: Erling Cruz [facility] 2017 10/22/16 A BROWNING    Methicillin resistance NOT DETECTED NOT DETECTED Final   Streptococcus species NOT DETECTED NOT DETECTED Final   Streptococcus agalactiae NOT DETECTED NOT DETECTED Final   Streptococcus pneumoniae NOT DETECTED NOT DETECTED Final   Streptococcus pyogenes NOT DETECTED NOT DETECTED Final   Acinetobacter baumannii NOT DETECTED NOT DETECTED Final   Enterobacteriaceae species NOT DETECTED NOT DETECTED Final   Enterobacter cloacae complex NOT DETECTED NOT DETECTED Final   Escherichia coli NOT DETECTED NOT DETECTED Final   Klebsiella oxytoca NOT DETECTED NOT DETECTED Final   Klebsiella pneumoniae NOT DETECTED NOT DETECTED Final   Proteus species NOT DETECTED NOT DETECTED Final   Serratia marcescens NOT DETECTED NOT DETECTED Final   Haemophilus influenzae NOT DETECTED NOT DETECTED Final   Neisseria meningitidis NOT DETECTED NOT DETECTED Final   Pseudomonas aeruginosa NOT DETECTED NOT DETECTED Final   Candida albicans NOT DETECTED NOT DETECTED Final   Candida glabrata NOT DETECTED NOT DETECTED Final   Candida  krusei NOT DETECTED NOT DETECTED Final   Candida parapsilosis NOT DETECTED NOT DETECTED Final   Candida tropicalis NOT DETECTED NOT DETECTED Final    Comment: Performed at [facility]  Culture, blood (Routine X 2) w Reflex to ID Panel     Status: None (Preliminary result)   Collection Time: 10/22/16 10:59 PM  Result Value Ref Range Status   Specimen Description BLOOD LEFT ANTECUBITAL  Final   Special Requests BOTTLES DRAWN AEROBIC AND ANAEROBIC 10 CC  Final   Culture   Final    NO GROWTH < 12 HOURS Performed at [facility]    Report Status PENDING  Incomplete  Culture, Urine     Status: Abnormal   Collection Time: 10/22/16 10:59 PM  Result Value Ref Range Status   Specimen Description URINE, CLEAN CATCH  Final   Special Requests NONE  Final   Culture (A)  Final    70,000 COLONIES/mL YEAST Performed at [facility]    Report Status 10/24/2016 FINAL  Final      Radiology Studies: US Renal  Result Date: 10/24/2016 CLINICAL DATA:  45 y/o  M; acute renal failure. EXAM: RENAL / URINARY TRACT ULTRASOUND COMPLETE COMPARISON:  None. FINDINGS: Right Kidney: Length: 12.8 cm. Echogenicity within normal limits. No hydronephrosis. Lower pole well-circumscribed anechoic 1.2 x 1.2 x 1.3 cm lesion with enhanced through transmission and no appreciable vascularity on color Doppler compatible with simple cyst. Left Kidney: Length: 12.4 cm. Echogenicity within normal limits. No mass or hydronephrosis visualized. Bladder: Foley catheter in situ. Bilateral pleural effusions partially visualized. Trace perihepatic ascites. IMPRESSION: 1. No mass or hydronephrosis of the kidneys. 2. Bilateral pleural effusions and trace perihepatic ascites. Electronically Signed   By: Mitzi Hansen M.D.   On: 10/24/2016 00:41   Ct Foot Right Wo Contrast  Result Date: 10/23/2016 CLINICAL DATA:  Erythema, warmth, and tenderness of the dorsal left midfoot. Cellulitis. Increasing pain. EXAM:  CT OF THE RIGHT FOOT WITHOUT CONTRAST TECHNIQUE: Multidetector CT imaging of the right foot was performed according to the standard protocol. Multiplanar CT image reconstructions were also generated. COMPARISON:  Radiographs dated 10/19/2016 FINDINGS: Bones/Joint/Cartilage There small marginal osteophytes on the distal tibia at the ankle joint. The bones of the foot appear normal. There are no appreciable joint effusions. There is nonspecific subcutaneous edema around the ankle and on the dorsum of the foot most prominent over the midfoot but there is no definable abscess. No evidence of osteomyelitis. IMPRESSION: Nonspecific subcutaneous edema around the ankle and foot. The findings could represent cellulitis. No evidence of abscess or osteomyelitis or joint effusions. Electronically Signed   By: Francene BoyersJames  Maxwell M.D.   On: 10/23/2016 13:52   Dg Chest Port 1 View  Result Date: 10/24/2016 CLINICAL DATA:  Dyspnea. EXAM: PORTABLE CHEST 1 VIEW COMPARISON:  09/06/2016 FINDINGS: Development of bilateral patchy airspace disease, right side greater than left. The most confluent airspace disease is near the right lung apex and the right lung base. Heart size is stable and within normal limits for size. The trachea is midline. Probable left pleural effusion. Cannot exclude a small right pleural effusion. IMPRESSION: Development of bilateral airspace disease, right side greater than left. Differential diagnosis would include diffuse pneumonia versus asymmetric pulmonary edema. Probable small pleural effusions, left side greater than right. Electronically Signed   By: Richarda OverlieAdam  Henn M.D.   On: 10/24/2016 09:39     Medications:  Scheduled: . nafcillin IV  2 g Intravenous Q4H  . nicotine  14 mg Transdermal Daily  . pantoprazole  40 mg Oral Daily  . predniSONE  40 mg Oral Q breakfast  . protein supplement shake  11 oz Oral BID BM  . sodium bicarbonate  650 mg Oral TID  . sodium chloride flush  3 mL Intravenous Q12H    Continuous:  WUJ:[id number]:acetaminophen **OR** acetaminophen, gi cocktail, oxyCODONE, polyethylene glycol, sodium chloride  Assessment/Plan:  Principal Problem:   Cellulitis Active Problems:   Hyperkalemia   Elevated liver enzymes   Acute renal failure (HCC)   Hepatitis C antibody test positive   Tobacco abuse   Hyponatremia   Hyperglycemia   Hypoalbuminemia due to protein-calorie malnutrition (HCC)   Normocytic anemia   Thrombocytopenia (HCC)   IVDU (intravenous drug user)   History of hepatitis C   Staphylococcus aureus bacteremia with sepsis (HCC)   Endocarditis of tricuspid valve   Aortic valve endocarditis   Severe aortic regurgitation   Severe tricuspid regurgitation   Osler's node   Septic embolism (HCC)     Infectious Endocarditis -Blood cultures thus far notable for MSSA -2-D echocardiogram has evidence of aortic and tricuspid valve endocarditis -Patient initially continued on Vanco and Zosyn. Infectious disease changed him to nafcillin for improved CNS penetration in case patient has emboli to the brain -Patient to have TEE. Unfortunately, it will have to wait till Monday.  -Anticipate patient will require 6-8 weeks of IV antibiotics. Per infectious disease, would not recommend PICC line at this time until bacteremia has cleared -Patient may need to be seen by cardiac thoracic surgery as well. Depending on the findings of TEE.  Dyspnea likely secondary to fluid overload Patient is complaining of shortness of breath this morning. Chest x-ray done revealed bilateral opacities. Patient has been on IV fluids since he was hospitalized 3 days ago. He has gained weight. Hence, this appears to be mostly fluid overload. He is saturating well on 2 L by nasal cannula. ABG reviewed. IV fluids will be stopped. Discussed with Dr. Lowell GuitarPowell with nephrology. We will give him 80 mg of Lasix intravenously times one. He will evaluate the patient shortly. If  patient does not improve with these  measures, may consider doing CT scan.  Acute renal failure (ARF) with metabolic acidosis and hyperkalemia -BUN remains elevated. Creatinine has not shown much improvement. -Continue Foley catheter due to need for critical monitoring.  -Initiate oral bicarbonate. Renal ultrasound does not reveal any hydronephrosis. Await nephrology input.  Polysubstance abuse -Patient reports prior drug rehabilitation programs and reportedly had been clean for over 7 years at one point. -Patient claims he is now motivated to quit for good following the events of this hospital admission.  Cellulitis -Bilateral foot lesions noted, surrounding areas of erythema have improved -Patient was initially continued on empiric vancomycin and Zosyn -Patient remains afebrile, leukocytosis has resolved, currently 8.2 -CT scan of right foot obtained. No evidence of abscess. Findings of soft tissue edema noted. -See below. Discussed case with infectious disease who has made changes to antibiotic regimen.  Hyperkalemia -Suspect secondary to presenting acute renal failure -Potassium remains above the upper limits of normal, currently 5.6 no peak T waves on EKG -Kayexalate  Elevated liver enzymes/hepatitis C antibody test positive -Known prior history of hepatitis C status post treatment -Last viral load noted to be undetectable, last checked in October 2017 -Plan to recheck for HIV, hepatitis B, hepatitis C per infectious disease recommendations  Tobacco abuse -Seems stable. Continue nicotine patch for now  Hyponatremia -Initial concerns for hypovolemic hyponatremia. Patient was given IV fluids. -Sodium has improved some. Continue to monitor.   Hyperglycemia -CBGs, minimally elevated. Continue to monitor for now.  Hypoalbuminemia due to protein-calorie malnutrition -Dietitian consulted -Appears stable this time  Normocytic anemia -Suspect secondary to anemia of chronic disease -There are no signs of  acute blood loss -Hemoglobin seems to be trending down slowly. Though there is no evidence for overt bleeding. Some of this could be dilutional. -Continue to monitor.  Thrombocytopenia -Counts are stable. Lovenox was discontinued yesterday.  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 Patient reports multiple joint pains including fingers back and knees and hips associated with joint swelling -Given acute renal failure, patient will not tolerate NSAID -Patient was given trial of prednisone. Patient has reported some improvement.   DVT Prophylaxis: SCDs    Code Status: Full code  Family Communication: Discussed with the patient and his family with his permission  Disposition Plan: Management as outlined above.    LOS: 3 days   [facility]  [facility] Pager [phone] 10/24/2016, 1:48 PM  If 7PM-7AM, please contact night-coverage at [email or web address], password [facility]

## 2016-10-24 NOTE — Consult Note (Signed)
I was asked to see Joel Sheppard is an 45 y.o. male IV drug user who had apparently injected a Gray powder intravenously days prior to onset of symptoms where he began complaining of burning pain in his feet. He is in the ER November 10 and found to have LE cellulitis and treated with PO Septra for a week. He came back with worsening foot pain, and labs are significant for acute renal failure with a creatinine now for a lactic acid that was elevated. He was placed on vancomycin and Zosyn. The [facility] ID revealed methicillin sensitive Staphylococcus aureus in blood cultures.  A transthoracic echocardiogram  showed 2-D: A medium-size vegetation on the aortic valve with severe aortic regurgitation. It also shows a very large vegetation on the tricuspid valve with mild to moderate regurgitation. He is receiving IV Nafcillin.  He has a hx of Hep C pos and AKI as noted in chart below. 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    Results for Joel Sheppard, Joel Sheppard (MRN [id number]) as of 10/24/2016 16:29  Ref. Range 09/05/2016 12:14 09/05/2016 16:14 09/05/2016 21:00 09/06/2016 02:57 10/10/2016 14:54 10/19/2016 20:56 10/22/2016 05:27 10/23/2016 05:07 10/24/2016 03:49  Creatinine Latest Ref Range: 0.61 - 1.24 mg/dL 2.46 (H) 1.71 (H) 1.45 (H) 1.17 0.81 4.34 (H) 4.31 (H) 4.12 (H) 3.08 (H)   Pt has been treated with IVFs and this afternoon developed SOB with CXR findings below. IVFs have been stopped and he was given iv furosemide with a brisk response.  Past Medical History:  Diagnosis Date  . Hepatitis C    s/p treatment with Harvoni  . Heroin overdose 09/05/2016  . IV drug abuse    Past Surgical History:  Procedure Laterality Date  . LEG SURGERY     Social History:  reports that he has been smoking Cigarettes.  He has been smoking about 1.00 pack per day. He has never used smokeless tobacco. He reports that he uses drugs, including Cocaine, IV, and Heroin, about 7 times per week. He reports that he does not  drink alcohol. Allergies: No Known Allergies Family History  Problem Relation Age of Onset  . Leukemia Father   . Alzheimer's disease Mother     Medications:  Scheduled: . nafcillin IV  2 g Intravenous Q4H  . nicotine  14 mg Transdermal Daily  . pantoprazole  40 mg Oral Daily  . predniSONE  40 mg Oral Q breakfast  . protein supplement shake  11 oz Oral BID BM  . sodium bicarbonate  650 mg Oral TID  . sodium chloride flush  3 mL Intravenous Q12H    ROS: as per HPI Blood pressure (!) 112/37, pulse (!) 101, temperature 98.1 F (36.7 C), temperature source Oral, resp. rate (!) 24, height 5' 9"  (1.753 m), weight 75.4 kg (166 lb 3.6 oz), SpO2 97 %.  General appearance: alert and mild distress Head: Normocephalic, without obvious abnormality, atraumatic Eyes: negative Resp: coarse BS Chest wall: no tenderness Cardio: regular rate and rhythm, S1, S2 normal, no murmur, click, rub or gallop GI: soft, non-tender; bowel sounds normal; no masses,  no organomegaly Extremities: extremities normal, atraumatic, no cyanosis or edema, left distal foot ecchymotic, extrems tender Skin: Skin color, texture, turgor normal. No rashes or lesions or multiple tats Neurologic: Grossly normal Results for orders placed or performed during the hospital encounter of 10/28/2016 (from the past 48 hour(s))  Urinalysis, Routine w reflex microscopic     Status: Abnormal   Collection Time: 10/22/16  10:42 PM  Result Value Ref Range   Color, Urine ORANGE (A) YELLOW    Comment: BIOCHEMICALS MAY BE AFFECTED BY COLOR   APPearance TURBID (A) CLEAR   Specific Gravity, Urine 1.020 1.005 - 1.030   pH 5.0 5.0 - 8.0   Glucose, UA NEGATIVE NEGATIVE mg/dL   Hgb urine dipstick LARGE (A) NEGATIVE   Bilirubin Urine SMALL (A) NEGATIVE   Ketones, ur NEGATIVE NEGATIVE mg/dL   Protein, ur NEGATIVE NEGATIVE mg/dL   Nitrite NEGATIVE NEGATIVE   Leukocytes, UA MODERATE (A) NEGATIVE  Sodium, urine, random     Status: None    Collection Time: 10/22/16 10:42 PM  Result Value Ref Range   Sodium, Ur <10 mmol/L    Comment: Performed at [facility]  Rapid urine drug screen (hospital performed)     Status: Abnormal   Collection Time: 10/22/16 10:42 PM  Result Value Ref Range   Opiates POSITIVE (A) NONE DETECTED   Cocaine POSITIVE (A) NONE DETECTED   Benzodiazepines NONE DETECTED NONE DETECTED   Amphetamines NONE DETECTED NONE DETECTED   Tetrahydrocannabinol POSITIVE (A) NONE DETECTED   Barbiturates NONE DETECTED NONE DETECTED    Comment:        DRUG SCREEN FOR MEDICAL PURPOSES ONLY.  IF CONFIRMATION IS NEEDED FOR ANY PURPOSE, NOTIFY LAB WITHIN 5 DAYS.        LOWEST DETECTABLE LIMITS FOR URINE DRUG SCREEN Drug Class       Cutoff (ng/mL) Amphetamine      1000 Barbiturate      200 Benzodiazepine   106 Tricyclics       269 Opiates          300 Cocaine          300 THC              50   Urine microscopic-add on     Status: Abnormal   Collection Time: 10/22/16 10:42 PM  Result Value Ref Range   Squamous Epithelial / LPF NONE SEEN NONE SEEN   WBC, UA TOO NUMEROUS TO COUNT 0 - 5 WBC/hpf   RBC / HPF 6-30 0 - 5 RBC/hpf   Bacteria, UA MANY (A) NONE SEEN   Urine-Other YEAST PRESENT   Culture, blood (Routine X 2) w Reflex to ID Panel     Status: None (Preliminary result)   Collection Time: 10/22/16 10:59 PM  Result Value Ref Range   Specimen Description BLOOD LEFT ANTECUBITAL    Special Requests BOTTLES DRAWN AEROBIC AND ANAEROBIC 10 CC    Culture      NO GROWTH 1 DAY Performed at [facility]    Report Status PENDING   Culture, Urine     Status: Abnormal   Collection Time: 10/22/16 10:59 PM  Result Value Ref Range   Specimen Description URINE, CLEAN CATCH    Special Requests NONE    Culture (A)     70,000 COLONIES/mL YEAST Performed at [facility]    Report Status 10/24/2016 FINAL   CBC     Status: Abnormal   Collection Time: 10/23/16  5:07 AM  Result Value Ref Range   WBC  8.2 4.0 - 10.5 K/uL   RBC 3.24 (L) 4.22 - 5.81 MIL/uL   Hemoglobin 9.0 (L) 13.0 - 17.0 g/dL   HCT 25.6 (L) 39.0 - 52.0 %   MCV 79.0 78.0 - 100.0 fL   MCH 27.8 26.0 - 34.0 pg   MCHC 35.2 30.0 - 36.0 g/dL  RDW 14.8 11.5 - 15.5 %   Platelets 91 (L) 150 - 400 K/uL    Comment: CONSISTENT WITH PREVIOUS RESULT  Basic metabolic panel     Status: Abnormal   Collection Time: 10/23/16  5:07 AM  Result Value Ref Range   Sodium 125 (L) 135 - 145 mmol/L   Potassium 5.6 (H) 3.5 - 5.1 mmol/L   Chloride 99 (L) 101 - 111 mmol/L   CO2 15 (L) 22 - 32 mmol/L   Glucose, Bld 139 (H) 65 - 99 mg/dL   BUN 153 (H) 6 - 20 mg/dL    Comment: RESULTS CONFIRMED BY MANUAL DILUTION   Creatinine, Ser 4.12 (H) 0.61 - 1.24 mg/dL   Calcium 7.4 (L) 8.9 - 10.3 mg/dL   GFR calc non Af Amer 16 (L) >60 mL/min   GFR calc Af Amer 19 (L) >60 mL/min    Comment: (NOTE) The eGFR has been calculated using the CKD EPI equation. This calculation has not been validated in all clinical situations. eGFR's persistently <60 mL/min signify possible Chronic Kidney Disease.    Anion gap 11 5 - 15  Comprehensive metabolic panel     Status: Abnormal   Collection Time: 10/24/16  3:49 AM  Result Value Ref Range   Sodium 130 (L) 135 - 145 mmol/L   Potassium 5.2 (H) 3.5 - 5.1 mmol/L   Chloride 105 101 - 111 mmol/L   CO2 14 (L) 22 - 32 mmol/L   Glucose, Bld 112 (H) 65 - 99 mg/dL   BUN 156 (H) 6 - 20 mg/dL   Creatinine, Ser 3.08 (H) 0.61 - 1.24 mg/dL   Calcium 7.4 (L) 8.9 - 10.3 mg/dL   Total Protein 5.0 (L) 6.5 - 8.1 g/dL   Albumin 1.2 (L) 3.5 - 5.0 g/dL   AST 65 (H) 15 - 41 U/L   ALT 41 17 - 63 U/L   Alkaline Phosphatase 106 38 - 126 U/L   Total Bilirubin 1.9 (H) 0.3 - 1.2 mg/dL   GFR calc non Af Amer 23 (L) >60 mL/min   GFR calc Af Amer 27 (L) >60 mL/min    Comment: (NOTE) The eGFR has been calculated using the CKD EPI equation. This calculation has not been validated in all clinical situations. eGFR's persistently <60 mL/min  signify possible Chronic Kidney Disease.    Anion gap 11 5 - 15  CBC     Status: Abnormal   Collection Time: 10/24/16  3:49 AM  Result Value Ref Range   WBC 9.3 4.0 - 10.5 K/uL   RBC 3.16 (L) 4.22 - 5.81 MIL/uL   Hemoglobin 8.8 (L) 13.0 - 17.0 g/dL   HCT 25.4 (L) 39.0 - 52.0 %   MCV 80.4 78.0 - 100.0 fL   MCH 27.8 26.0 - 34.0 pg   MCHC 34.6 30.0 - 36.0 g/dL   RDW 14.8 11.5 - 15.5 %   Platelets 126 (L) 150 - 400 K/uL  Blood gas, arterial     Status: Abnormal   Collection Time: 10/24/16  2:12 PM  Result Value Ref Range   O2 Content 2.0 L/min   Delivery systems NASAL CANNULA    pH, Arterial 7.405 7.350 - 7.450   pCO2 arterial 25.0 (L) 32.0 - 48.0 mmHg   pO2, Arterial 70.0 (L) 83.0 - 108.0 mmHg   Bicarbonate 15.3 (L) 20.0 - 28.0 mmol/L   Acid-base deficit 8.5 (H) 0.0 - 2.0 mmol/L   O2 Saturation 93.0 %   Patient temperature 98.6  Collection site LEFT RADIAL    Drawn by [id number]    Sample type ARTERIAL DRAW    Allens test (pass/fail) PASS PASS   US Renal  Result Date: 10/24/2016 CLINICAL DATA:  45 y/o  M; acute renal failure. EXAM: RENAL / URINARY TRACT ULTRASOUND COMPLETE COMPARISON:  None. FINDINGS: Right Kidney: Length: 12.8 cm. Echogenicity within normal limits. No hydronephrosis. Lower pole well-circumscribed anechoic 1.2 x 1.2 x 1.3 cm lesion with enhanced through transmission and no appreciable vascularity on color Doppler compatible with simple cyst. Left Kidney: Length: 12.4 cm. Echogenicity within normal limits. No mass or hydronephrosis visualized. Bladder: Foley catheter in situ. Bilateral pleural effusions partially visualized. Trace perihepatic ascites. IMPRESSION: 1. No mass or hydronephrosis of the kidneys. 2. Bilateral pleural effusions and trace perihepatic ascites. Electronically Signed   By: Kristine Garbe M.D.   On: 10/24/2016 00:41   Ct Foot Right Wo Contrast  Result Date: 10/23/2016 CLINICAL DATA:  Erythema, warmth, and tenderness of the dorsal  left midfoot. Cellulitis. Increasing pain. EXAM: CT OF THE RIGHT FOOT WITHOUT CONTRAST TECHNIQUE: Multidetector CT imaging of the right foot was performed according to the standard protocol. Multiplanar CT image reconstructions were also generated. COMPARISON:  Radiographs dated 10/05/2016 FINDINGS: Bones/Joint/Cartilage There small marginal osteophytes on the distal tibia at the ankle joint. The bones of the foot appear normal. There are no appreciable joint effusions. There is nonspecific subcutaneous edema around the ankle and on the dorsum of the foot most prominent over the midfoot but there is no definable abscess. No evidence of osteomyelitis. IMPRESSION: Nonspecific subcutaneous edema around the ankle and foot. The findings could represent cellulitis. No evidence of abscess or osteomyelitis or joint effusions. Electronically Signed   By: Lorriane Shire M.D.   On: 10/23/2016 13:52   Dg Chest Port 1 View  Result Date: 10/24/2016 CLINICAL DATA:  Dyspnea. EXAM: PORTABLE CHEST 1 VIEW COMPARISON:  09/06/2016 FINDINGS: Development of bilateral patchy airspace disease, right side greater than left. The most confluent airspace disease is near the right lung apex and the right lung base. Heart size is stable and within normal limits for size. The trachea is midline. Probable left pleural effusion. Cannot exclude a small right pleural effusion. IMPRESSION: Development of bilateral airspace disease, right side greater than left. Differential diagnosis would include diffuse pneumonia versus asymmetric pulmonary edema. Probable small pleural effusions, left side greater than right. Electronically Signed   By: Markus Daft M.D.   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

## 2016-10-25 DIAGNOSIS — D649 Anemia, unspecified: Secondary | ICD-10-CM

## 2016-10-25 DIAGNOSIS — E8779 Other fluid overload: Secondary | ICD-10-CM

## 2016-10-25 DIAGNOSIS — I368 Other nonrheumatic tricuspid valve disorders: Secondary | ICD-10-CM

## 2016-10-25 DIAGNOSIS — R197 Diarrhea, unspecified: Secondary | ICD-10-CM

## 2016-10-25 LAB — BLOOD CULTURE ID PANEL (REFLEXED)
ACINETOBACTER BAUMANNII: NOT DETECTED
CANDIDA ALBICANS: NOT DETECTED
CANDIDA GLABRATA: NOT DETECTED
Candida krusei: NOT DETECTED
Candida parapsilosis: NOT DETECTED
Candida tropicalis: NOT DETECTED
ENTEROBACTER CLOACAE COMPLEX: NOT DETECTED
ENTEROBACTERIACEAE SPECIES: NOT DETECTED
ENTEROCOCCUS SPECIES: NOT DETECTED
Escherichia coli: NOT DETECTED
HAEMOPHILUS INFLUENZAE: NOT DETECTED
Klebsiella oxytoca: NOT DETECTED
Klebsiella pneumoniae: NOT DETECTED
LISTERIA MONOCYTOGENES: NOT DETECTED
METHICILLIN RESISTANCE: NOT DETECTED
NEISSERIA MENINGITIDIS: NOT DETECTED
Proteus species: NOT DETECTED
Pseudomonas aeruginosa: NOT DETECTED
STREPTOCOCCUS AGALACTIAE: NOT DETECTED
STREPTOCOCCUS PYOGENES: NOT DETECTED
STREPTOCOCCUS SPECIES: NOT DETECTED
Serratia marcescens: NOT DETECTED
Staphylococcus aureus (BCID): DETECTED — AB
Staphylococcus species: DETECTED — AB
Streptococcus pneumoniae: NOT DETECTED

## 2016-10-25 LAB — COMPREHENSIVE METABOLIC PANEL
ALK PHOS: 110 U/L (ref 38–126)
ALT: 39 U/L (ref 17–63)
ANION GAP: 10 (ref 5–15)
AST: 68 U/L — ABNORMAL HIGH (ref 15–41)
Albumin: 1.3 g/dL — ABNORMAL LOW (ref 3.5–5.0)
BUN: 135 mg/dL — ABNORMAL HIGH (ref 6–20)
CALCIUM: 7.3 mg/dL — AB (ref 8.9–10.3)
CHLORIDE: 107 mmol/L (ref 101–111)
CO2: 19 mmol/L — AB (ref 22–32)
Creatinine, Ser: 2.39 mg/dL — ABNORMAL HIGH (ref 0.61–1.24)
GFR calc non Af Amer: 31 mL/min — ABNORMAL LOW (ref >60)
GFR, EST AFRICAN AMERICAN: 36 mL/min — AB (ref >60)
Glucose, Bld: 105 mg/dL — ABNORMAL HIGH (ref 65–99)
Potassium: 3.7 mmol/L (ref 3.5–5.1)
SODIUM: 136 mmol/L (ref 135–145)
Total Bilirubin: 4 mg/dL — ABNORMAL HIGH (ref 0.3–1.2)
Total Protein: 5.1 g/dL — ABNORMAL LOW (ref 6.5–8.1)

## 2016-10-25 LAB — CULTURE, BLOOD (ROUTINE X 2)

## 2016-10-25 LAB — CBC WITH DIFFERENTIAL/PLATELET
BASOS PCT: 0 %
Basophils Absolute: 0 10*3/uL (ref 0.0–0.1)
EOS ABS: 0 10*3/uL (ref 0.0–0.7)
Eosinophils Relative: 0 %
HCT: 26.5 % — ABNORMAL LOW (ref 39.0–52.0)
HEMOGLOBIN: 9.3 g/dL — AB (ref 13.0–17.0)
LYMPHS PCT: 8 %
Lymphs Abs: 0.8 10*3/uL (ref 0.7–4.0)
MCH: 28.2 pg (ref 26.0–34.0)
MCHC: 35.1 g/dL (ref 30.0–36.0)
MCV: 80.3 fL (ref 78.0–100.0)
Monocytes Absolute: 0.8 10*3/uL (ref 0.1–1.0)
Monocytes Relative: 8 %
NEUTROS PCT: 84 %
Neutro Abs: 8.9 10*3/uL — ABNORMAL HIGH (ref 1.7–7.7)
Platelets: 186 10*3/uL (ref 150–400)
RBC: 3.3 MIL/uL — AB (ref 4.22–5.81)
RDW: 15.5 % (ref 11.5–15.5)
WBC: 10.5 10*3/uL (ref 4.0–10.5)

## 2016-10-25 LAB — C DIFFICILE QUICK SCREEN W PCR REFLEX
C DIFFICILE (CDIFF) INTERP: NOT DETECTED
C DIFFICILE (CDIFF) TOXIN: NEGATIVE
C Diff antigen: NEGATIVE

## 2016-10-25 MED ORDER — POTASSIUM CHLORIDE 20 MEQ PO PACK
20.0000 meq | PACK | Freq: Three times a day (TID) | ORAL | Status: AC
  Administered 2016-10-25 (×3): 20 meq via ORAL
  Filled 2016-10-25 (×3): qty 1

## 2016-10-25 MED ORDER — FUROSEMIDE 10 MG/ML IJ SOLN
40.0000 mg | Freq: Once | INTRAMUSCULAR | Status: AC
  Administered 2016-10-25: 40 mg via INTRAVENOUS
  Filled 2016-10-25: qty 4

## 2016-10-25 NOTE — Progress Notes (Signed)
Assessment:  1 AKI prob immune complex mediated due to endocarditis or embolic 2 Volume Overload, improving 3 Acute bacterial endocarditis 4 Active IVDU 5 MSSA bacteremia  Plan: 1 Cont to Diurese as needed 2 f/u complement levels and eos 3 Treat underlying cause of infection  Subjective: Interval History: Great UOP, breathing better  Objective: Vital signs in last 24 hours: Temp:  [98.2 F (36.8 C)] 98.2 F (36.8 C) (11/25 0500) Pulse Rate:  [108-117] 117 (11/25 0500) Resp:  [18] 18 (11/25 0500) BP: (126-127)/(37-47) 127/37 (11/25 0500) SpO2:  [95 %-97 %] 95 % (11/25 0500) Weight:  [75.3 kg (166 lb)] 75.3 kg (166 lb) (11/25 0500) Weight change: -0.103 kg (-3.6 oz)  Intake/Output from previous day: 11/24 0701 - 11/25 0700 In: 1302 [P.O.:1102; IV Piggyback:200] Out: 6750 [Urine:6750] Intake/Output this shift: No intake/output data recorded.  General appearance: alert Resp: rales bilaterally Chest wall: no tenderness Cardio: regular rate and rhythm, S1, S2 normal, no murmur, click, rub or gallop Extremities: edema 1+  l foot with cellulitic region   Lab Results:  Recent Labs  10/24/16 0349 10/25/16 0513  WBC 9.3 10.5  HGB 8.8* 9.3*  HCT 25.4* 26.5*  PLT 126* 186   BMET:  Recent Labs  10/24/16 0349 10/25/16 0513  NA 130* 136  K 5.2* 3.7  CL 105 107  CO2 14* 19*  GLUCOSE 112* 105*  BUN 156* 135*  CREATININE 3.08* 2.39*  CALCIUM 7.4* 7.3*   No results for input(s): PTH in the last 72 hours. Iron Studies: No results for input(s): IRON, TIBC, TRANSFERRIN, FERRITIN in the last 72 hours. Studies/Results: [place] Renal  Result Date: 10/24/2016 CLINICAL DATA:  45 y/o  M; acute renal failure. EXAM: RENAL / URINARY TRACT ULTRASOUND COMPLETE COMPARISON:  None. FINDINGS: Right Kidney: Length: 12.8 cm. Echogenicity within normal limits. No hydronephrosis. Lower pole well-circumscribed anechoic 1.2 x 1.2 x 1.3 cm lesion with enhanced through transmission and no  appreciable vascularity on color Doppler compatible with simple cyst. Left Kidney: Length: 12.4 cm. Echogenicity within normal limits. No mass or hydronephrosis visualized. Bladder: Foley catheter in situ. Bilateral pleural effusions partially visualized. Trace perihepatic ascites. IMPRESSION: 1. No mass or hydronephrosis of the kidneys. 2. Bilateral pleural effusions and trace perihepatic ascites. Electronically Signed   By: Mitzi HansenLance  Furusawa-Stratton M.D.   On: 10/24/2016 00:41   Ct Foot Right Wo Contrast  Result Date: 10/23/2016 CLINICAL DATA:  Erythema, warmth, and tenderness of the dorsal left midfoot. Cellulitis. Increasing pain. EXAM: CT OF THE RIGHT FOOT WITHOUT CONTRAST TECHNIQUE: Multidetector CT imaging of the right foot was performed according to the standard protocol. Multiplanar CT image reconstructions were also generated. COMPARISON:  Radiographs dated 10/28/2016 FINDINGS: Bones/Joint/Cartilage There small marginal osteophytes on the distal tibia at the ankle joint. The bones of the foot appear normal. There are no appreciable joint effusions. There is nonspecific subcutaneous edema around the ankle and on the dorsum of the foot most prominent over the midfoot but there is no definable abscess. No evidence of osteomyelitis. IMPRESSION: Nonspecific subcutaneous edema around the ankle and foot. The findings could represent cellulitis. No evidence of abscess or osteomyelitis or joint effusions. Electronically Signed   By: Francene BoyersJames  Maxwell M.D.   On: 10/23/2016 13:52   Dg Chest Port 1 View  Result Date: 10/24/2016 CLINICAL DATA:  Dyspnea. EXAM: PORTABLE CHEST 1 VIEW COMPARISON:  09/06/2016 FINDINGS: Development of bilateral patchy airspace disease, right side greater than left. The most confluent airspace disease is near the right  lung apex and the right lung base. Heart size is stable and within normal limits for size. The trachea is midline. Probable left pleural effusion. Cannot exclude a small  right pleural effusion. IMPRESSION: Development of bilateral airspace disease, right side greater than left. Differential diagnosis would include diffuse pneumonia versus asymmetric pulmonary edema. Probable small pleural effusions, left side greater than right. Electronically Signed   By: Richarda OverlieAdam  Henn M.D.   On: 10/24/2016 09:39    Scheduled: . nafcillin IV  2 g Intravenous Q4H  . nicotine  14 mg Transdermal Daily  . pantoprazole  40 mg Oral Daily  . predniSONE  40 mg Oral Q breakfast  . protein supplement shake  11 oz Oral BID BM  . sodium bicarbonate  650 mg Oral TID  . sodium chloride flush  3 mL Intravenous Q12H    LOS: 4 days   Khloey Chern C 10/25/2016,9:01 AM

## 2016-10-25 NOTE — Progress Notes (Signed)
Pt teaching provided regarding the patient fluid restriction . Pt continues to ask for drinks and liquid products . Informed pt that to prevent fluid overload we are monitoring his intake and out very carfully . He is aware that he is taking  Lasix and its therapeutic effects . Pt volume so far roday is 600 / I explained his limit and his remaining allotted fluid intake

## 2016-10-25 NOTE — Progress Notes (Signed)
PT Cancellation Note  Patient Details Name: Nathaniel ManJames S Templin MRN: [id number] DOB: 09/13/1971   Cancelled Treatment:    Reason Eval/Treat Not Completed: Pain limiting ability to participate, pt reports that he is hurting all over and cannot move. 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 When discussing d/c plans, he is hopeful that he can get [place].    Iyannah Blake L Lindalee Huizinga 10/25/2016, 11:26 AM

## 2016-10-25 NOTE — Progress Notes (Signed)
[facility] PROGRESS NOTE  Joel Sheppard ZOX:[id number] DOB: 12/17/1970 DOA: 10/27/2016  PCP: Pcp Not In System  Brief History/Interval Summary: 45 year old Caucasian male with a past medical history of intravenous drug use, last use just prior to admission, hepatitis C status post treatment with her morning, recent hospitalization in October for heroin overdose presented with findings suggestive of cellulitis involving the left foot and right tibial tuberosity. He was initially treated with oral antibiotics with which he did not improve and actually had worsening foot pain. He was hospitalized for further management. Subsequently, he developed fluid overload. He was given Lasix with improvement.  Reason for Visit: Lower extremity cellulitis. Endocarditis.  Consultants: Infectious disease. Nephrology.  Procedures:  Transthoracic echocardiogram Study Conclusions - Left ventricle: The cavity size was normal. Wall thickness was   normal. Systolic function was normal. The estimated ejection   fraction was in the range of 60% to 65%. Wall motion was normal;   there were no regional wall motion abnormalities. - Aortic valve: There was a medium-sized vegetation on the left   ventricular aspect. There was severe regurgitation. Valve area   (VTI): 2.11 cm^2. Valve area (Vmax): 2.01 cm^2. Valve area   (Vmean): 1.92 cm^2. - Tricuspid valve: There was a large vegetation on the right atrial   aspect. There was mild-moderate regurgitation. - Pulmonary arteries: Systolic pressure was mildly increased. PA   peak pressure: 39 mm Hg (S). Impressions: - Normal LV function   There is evidence of aortic valve endocarditis and tricuspid   valve endocarditis   Antibiotics: Previously, he has been on vancomycin, Zosyn, cefepime Currently on nafcillin  Subjective/Interval History: Patient states that his shortness of breath is significantly improved. He is questioning the reason for his  fluid restriction. This was explained to him in detail. Denies any chest pain. Denies any nausea or vomiting. Complains of multiple watery loose stools overnight.  ROS: Denies any nausea or vomiting  Objective:  Vital Signs  Vitals:   10/24/16 0841 10/24/16 1432 10/24/16 2111 10/25/16 0500  BP: (!) 112/37  (!) 126/47 (!) 127/37  Pulse: (!) 101  (!) 108 (!) 117  Resp: (!) 24   18  Temp:    98.2 F (36.8 C)  TempSrc:   Oral Oral  SpO2: 98% 97%  95%  Weight:    75.3 kg (166 lb)  Height:    5\' 9"  (1.753 m)    Intake/Output Summary (Last 24 hours) at 10/25/16 0819 Last data filed at 10/25/16 8119  Gross per 24 hour  Intake             1302 ml  Output             6600 ml  Net            -5298 ml   Filed Weights   10/22/16 0239 10/23/16 2047 10/25/16 0500  Weight: 70.3 kg (155 lb) 75.4 kg (166 lb 3.6 oz) 75.3 kg (166 lb)    General appearance: alert, cooperative, appears stated age and no distress Resp: Continues to have crackles bilateral bases. Coarse sounds bilaterally. No wheezing. Cardio: regular rate and rhythm, S1, S2 normal, no murmur, click, rub or gallop GI: soft. Mildly tenderness diffusely without any rebound, rigidity or guarding. Extremities: Bilateral edema in the lower extremities Neurologic: Awake, alert. Oriented 3. No obvious focal neurological deficits.  Lab Results:  Data Reviewed: I have personally reviewed following labs and imaging studies  CBC:  Recent Labs Lab 10/29/2016  2056 10/22/16 0527 10/23/16 0507 10/24/16 0349 10/25/16 0513  WBC 16.4* 15.7* 8.2 9.3 10.5  NEUTROABS 14.1*  --   --   --  8.9*  HGB 10.8* 10.3* 9.0* 8.8* 9.3*  HCT 30.9* 29.1* 25.6* 25.4* 26.5*  MCV 80.5 80.2 79.0 80.4 80.3  PLT 132* 100* 91* 126* 186    Basic Metabolic Panel:  Recent Labs Lab 2016/08/19 2056 10/22/16 0527 10/23/16 0507 10/24/16 0349 10/25/16 0513  NA 121* 122* 125* 130* 136  K 5.5* 5.4* 5.6* 5.2* 3.7  CL 89* 92* 99* 105 107  CO2 20* 19* 15*  14* 19*  GLUCOSE 114* 84 139* 112* 105*  BUN 134* 143* 153* 156* 135*  CREATININE 4.34* 4.31* 4.12* 3.08* 2.39*  CALCIUM 8.0* 7.5* 7.4* 7.4* 7.3*  MG  --  2.8*  --   --   --   PHOS  --  9.0*  --   --   --     GFR: Estimated Creatinine Clearance: 39 mL/min (by C-G formula based on SCr of 2.39 mg/dL (H)).  Liver Function Tests:  Recent Labs Lab 2016/08/19 2056 10/22/16 0527 10/24/16 0349 10/25/16 0513  AST 91* 61* 65* 68*  ALT 83* 60 41 39  ALKPHOS 162* 134* 106 110  BILITOT 2.8* 3.0* 1.9* 4.0*  PROT 6.4* 5.5* 5.0* 5.1*  ALBUMIN 1.8* 1.6* 1.2* 1.3*    Coagulation Profile:  Recent Labs Lab 10/22/16 0527  INR 1.33    Cardiac Enzymes:  Recent Labs Lab 10/22/16 0022  CKTOTAL 219     Recent Results (from the past 240 hour(s))  Blood culture (routine x 2)     Status: Abnormal (Preliminary result)   Collection Time: 10/22/16 12:01 AM  Result Value Ref Range Status   Specimen Description BLOOD RIGHT ARM  Final   Special Requests BOTTLES DRAWN AEROBIC AND ANAEROBIC 5ML  Final   Culture  Setup Time   Final    GRAM POSITIVE COCCI IN CLUSTERS IN BOTH AEROBIC AND ANAEROBIC BOTTLES CRITICAL VALUE NOTED.  VALUE IS CONSISTENT WITH PREVIOUSLY REPORTED AND CALLED VALUE.    Culture STAPHYLOCOCCUS AUREUS (A)  Final   Report Status PENDING  Incomplete   Organism ID, Bacteria STAPHYLOCOCCUS AUREUS  Final      Susceptibility   Staphylococcus aureus - MIC*    CIPROFLOXACIN <=0.5 SENSITIVE Sensitive     ERYTHROMYCIN <=0.25 SENSITIVE Sensitive     GENTAMICIN <=0.5 SENSITIVE Sensitive     OXACILLIN <=0.25 SENSITIVE Sensitive     TETRACYCLINE <=1 SENSITIVE Sensitive     VANCOMYCIN 1 SENSITIVE Sensitive     TRIMETH/SULFA <=10 SENSITIVE Sensitive     CLINDAMYCIN <=0.25 SENSITIVE Sensitive     RIFAMPIN <=0.5 SENSITIVE Sensitive     Inducible Clindamycin Value in next row Sensitive      [facility]IVEPerformed at [facility]    * STAPHYLOCOCCUS AUREUS  Blood culture (routine x  2)     Status: Abnormal (Preliminary result)   Collection Time: 10/22/16 12:20 AM  Result Value Ref Range Status   Specimen Description BLOOD LEFT ANTECUBITAL  Final   Special Requests BOTTLES DRAWN AEROBIC AND ANAEROBIC 5ML  Final   Culture  Setup Time   Final    GRAM POSITIVE COCCI IN CLUSTERS IN BOTH AEROBIC AND ANAEROBIC BOTTLES CRITICAL RESULT CALLED TO, READ BACK BY AND VERIFIED WITHErling Cruz: [facility] 10/22/16 A BROWNING [facility]    Culture STAPHYLOCOCCUS AUREUS (A)  Final   Report Status  PENDING  Incomplete  Blood Culture ID Panel (Reflexed)     Status: Abnormal   Collection Time: 10/22/16 12:20 AM  Result Value Ref Range Status   Enterococcus species NOT DETECTED NOT DETECTED Final   Listeria monocytogenes NOT DETECTED NOT DETECTED Final   Staphylococcus species DETECTED (A) NOT DETECTED Final    Comment: CRITICAL RESULT CALLED TO, READ BACK BY AND VERIFIED WITH: E JACKSON PHARMD 2017 10/22/16 A BROWNING    Staphylococcus aureus DETECTED (A) NOT DETECTED Final    Comment: CRITICAL RESULT CALLED TO, READ BACK BY AND VERIFIED WITH: Erling Cruz [facility] 2017 10/22/16 A BROWNING    Methicillin resistance NOT DETECTED NOT DETECTED Final   Streptococcus species NOT DETECTED NOT DETECTED Final   Streptococcus agalactiae NOT DETECTED NOT DETECTED Final   Streptococcus pneumoniae NOT DETECTED NOT DETECTED Final   Streptococcus pyogenes NOT DETECTED NOT DETECTED Final   Acinetobacter baumannii NOT DETECTED NOT DETECTED Final   Enterobacteriaceae species NOT DETECTED NOT DETECTED Final   Enterobacter cloacae complex NOT DETECTED NOT DETECTED Final   Escherichia coli NOT DETECTED NOT DETECTED Final   Klebsiella oxytoca NOT DETECTED NOT DETECTED Final   Klebsiella pneumoniae NOT DETECTED NOT DETECTED Final   Proteus species NOT DETECTED NOT DETECTED Final   Serratia marcescens NOT DETECTED NOT DETECTED Final   Haemophilus influenzae NOT DETECTED NOT DETECTED  Final   Neisseria meningitidis NOT DETECTED NOT DETECTED Final   Pseudomonas aeruginosa NOT DETECTED NOT DETECTED Final   Candida albicans NOT DETECTED NOT DETECTED Final   Candida glabrata NOT DETECTED NOT DETECTED Final   Candida krusei NOT DETECTED NOT DETECTED Final   Candida parapsilosis NOT DETECTED NOT DETECTED Final   Candida tropicalis NOT DETECTED NOT DETECTED Final    Comment: Performed at [facility]  Culture, blood (Routine X 2) w Reflex to ID Panel     Status: None (Preliminary result)   Collection Time: 10/22/16 10:59 PM  Result Value Ref Range Status   Specimen Description BLOOD LEFT ANTECUBITAL  Final   Special Requests BOTTLES DRAWN AEROBIC AND ANAEROBIC 10 CC  Final   Culture   Final    NO GROWTH 1 DAY Performed at [facility]    Report Status PENDING  Incomplete  Culture, Urine     Status: Abnormal   Collection Time: 10/22/16 10:59 PM  Result Value Ref Range Status   Specimen Description URINE, CLEAN CATCH  Final   Special Requests NONE  Final   Culture (A)  Final    70,000 COLONIES/mL YEAST Performed at [facility]    Report Status 10/24/2016 FINAL  Final      Radiology Studies: US Renal  Result Date: 10/24/2016 CLINICAL DATA:  45 y/o  M; acute renal failure. EXAM: RENAL / URINARY TRACT ULTRASOUND COMPLETE COMPARISON:  None. FINDINGS: Right Kidney: Length: 12.8 cm. Echogenicity within normal limits. No hydronephrosis. Lower pole well-circumscribed anechoic 1.2 x 1.2 x 1.3 cm lesion with enhanced through transmission and no appreciable vascularity on color Doppler compatible with simple cyst. Left Kidney: Length: 12.4 cm. Echogenicity within normal limits. No mass or hydronephrosis visualized. Bladder: Foley catheter in situ. Bilateral pleural effusions partially visualized. Trace perihepatic ascites. IMPRESSION: 1. No mass or hydronephrosis of the kidneys. 2. Bilateral pleural effusions and trace perihepatic ascites. Electronically  Signed   By: Mitzi Hansen M.D.   On: 10/24/2016 00:41   Ct Foot Right Wo Contrast  Result Date: 10/23/2016 CLINICAL DATA:  Erythema, warmth, and tenderness  of the dorsal left midfoot. Cellulitis. Increasing pain. EXAM: CT OF THE RIGHT FOOT WITHOUT CONTRAST TECHNIQUE: Multidetector CT imaging of the right foot was performed according to the standard protocol. Multiplanar CT image reconstructions were also generated. COMPARISON:  Radiographs dated 05-11-2016 FINDINGS: Bones/Joint/Cartilage There small marginal osteophytes on the distal tibia at the ankle joint. The bones of the foot appear normal. There are no appreciable joint effusions. There is nonspecific subcutaneous edema around the ankle and on the dorsum of the foot most prominent over the midfoot but there is no definable abscess. No evidence of osteomyelitis. IMPRESSION: Nonspecific subcutaneous edema around the ankle and foot. The findings could represent cellulitis. No evidence of abscess or osteomyelitis or joint effusions. Electronically Signed   By: Francene BoyersJames  Maxwell M.D.   On: 10/23/2016 13:52   Dg Chest Port 1 View  Result Date: 10/24/2016 CLINICAL DATA:  Dyspnea. EXAM: PORTABLE CHEST 1 VIEW COMPARISON:  09/06/2016 FINDINGS: Development of bilateral patchy airspace disease, right side greater than left. The most confluent airspace disease is near the right lung apex and the right lung base. Heart size is stable and within normal limits for size. The trachea is midline. Probable left pleural effusion. Cannot exclude a small right pleural effusion. IMPRESSION: Development of bilateral airspace disease, right side greater than left. Differential diagnosis would include diffuse pneumonia versus asymmetric pulmonary edema. Probable small pleural effusions, left side greater than right. Electronically Signed   By: Richarda OverlieAdam  Henn M.D.   On: 10/24/2016 09:39     Medications:  Scheduled: . nafcillin IV  2 g Intravenous Q4H  . nicotine  14  mg Transdermal Daily  . pantoprazole  40 mg Oral Daily  . predniSONE  40 mg Oral Q breakfast  . protein supplement shake  11 oz Oral BID BM  . sodium bicarbonate  650 mg Oral TID  . sodium chloride flush  3 mL Intravenous Q12H   Continuous:  ZOX:[id number]:acetaminophen **OR** acetaminophen, gi cocktail, oxyCODONE, sodium chloride  Assessment/Plan:  Principal Problem:   Cellulitis Active Problems:   Hyperkalemia   Elevated liver enzymes   ARF (acute renal failure) (HCC)   Hepatitis C antibody test positive   Tobacco abuse   Hyponatremia   Hyperglycemia   Hypoalbuminemia due to protein-calorie malnutrition (HCC)   Normocytic anemia   Thrombocytopenia (HCC)   IVDU (intravenous drug user)   History of hepatitis C   Staphylococcus aureus bacteremia with sepsis (HCC)   Endocarditis of tricuspid valve   Aortic valve endocarditis   Severe aortic regurgitation   Severe tricuspid regurgitation   Osler's node   Septic embolism (HCC)   Hypoxemia     Infectious Endocarditis -Blood cultures thus far notable for MSSA -2-D echocardiogram has evidence of aortic and tricuspid valve endocarditis -Patient initially continued on Vanco and Zosyn. Infectious disease changed him to nafcillin for improved CNS penetration in case patient has emboli to the brain -Patient to have TEE. Unfortunately, it will have to wait till Monday. Heart rate is stable. No evidence for any AV blocks. -Anticipate patient will require 6-8 weeks of IV antibiotics. Per infectious disease, would not recommend PICC line at this time until bacteremia has cleared -Patient may need to be seen by cardiothoracic surgery as well depending on the findings of TEE.  Dyspnea likely secondary to fluid overload Patient's chest x-ray showed bilateral opacities concerning for fluid overload versus infection. Patient was given intravenous Lasix with good response. He is diuresed well. His symptoms have improved. Dyspnea, most likely  secondary to fluid overload. Discussed with Dr. Lowell Guitar with nephrology this morning. He will evaluate patient and consider additional doses of diuretics. Patient remains stable.   Acute renal failure (ARF) with metabolic acidosis and hyperkalemia -Patient was initially given IV fluids. As mentioned above, he developed fluid overload. Patient was given Lasix yesterday. His BUN and creatinine has improved. He has good urine output. Continue Foley catheter due to need for critical monitoring. Also on oral bicarbonate. Renal ultrasound does not reveal any hydronephrosis.  Acute diarrhea Patient reports watery stools. He has been on antibiotics for many days. Check stool for C. difficile.  Polysubstance abuse -Patient reports prior drug rehabilitation programs and reportedly had been clean for over 7 years at one point. -Patient claims he is now motivated to quit for good following the events of this hospital admission.  Cellulitis -Bilateral foot lesions noted, surrounding areas of erythema have improved -Patient was initially continued on empiric vancomycin and Zosyn. Now on nafcillin -Patient remains afebrile, leukocytosis has resolved, currently 8.2 -CT scan of right foot obtained. No evidence of abscess. Findings of soft tissue edema noted. -See below. Discussed case with infectious disease who has made changes to antibiotic regimen.  Hyperkalemia -Suspect secondary to presenting acute renal failure -Potassium is improved with Kayexalate  Elevated liver enzymes/hepatitis C antibody test positive -Known prior history of hepatitis C status post treatment -Last viral load noted to be undetectable, last checked in October 2017 -Plan to recheck for HIV, hepatitis B, hepatitis C per infectious disease recommendations  Tobacco abuse -Seems stable. Continue nicotine patch for now  Hyponatremia -Initial concerns for hypovolemic hyponatremia. Patient was given IV fluids. -Sodium has  improved some. Continue to monitor.   Hyperglycemia This has been mild. Will need outpatient monitoring.  Hypoalbuminemia due to protein-calorie malnutrition -Dietitian consulted -Appears stable this time  Normocytic anemia -Suspect secondary to anemia of chronic disease -There are no signs of acute blood loss -Hemoglobin seems to be stable. There is no evidence for overt bleeding. Some of this could be dilutional. -Continue to monitor.  Thrombocytopenia -Counts are stable. Lovenox was discontinued.  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 Patient reports multiple joint pains including fingers back and knees and hips associated with joint swelling -Given acute renal failure, patient will not tolerate NSAID -Patient was given trial of prednisone. Patient has reported some improvement.   DVT Prophylaxis: SCDs    Code Status: Full code  Family Communication: Discussed with the patient Disposition Plan: Management as outlined above. Await TEE, which is to be done on Monday.    LOS: 4 days   [facility]  [facility] Pager [phone] 10/25/2016, 8:19 AM  If 7PM-7AM, please contact night-coverage at [email or web address], password [facility]

## 2016-10-25 NOTE — Progress Notes (Signed)
Patient placed on enteric precautions per MD order to R/O C.Diff. Stool sample collected and sent to lab. Rectal tube to be placed per MD orders. Will continue to monitor. Joel RubensAnisha BSN, RN

## 2016-10-25 NOTE — Progress Notes (Signed)
Per md test stool in am for cdiff

## 2016-10-25 NOTE — Progress Notes (Signed)
Subjective:  Copious diarrhea   Antibiotics:  Anti-infectives    Start     Dose/Rate Route Frequency Ordered Stop   10/23/16 1700  nafcillin injection 2 g  Status:  Discontinued     2 g Intravenous Every 4 hours 10/23/16 1617 10/23/16 1655   10/23/16 1700  nafcillin 2 g in dextrose 5 % 100 mL IVPB     2 g 200 mL/hr over 30 Minutes Intravenous Every 4 hours 10/23/16 1655     10/22/16 2359  vancomycin (VANCOCIN) IVPB 750 mg/150 ml premix  Status:  Discontinued     750 mg 150 mL/hr over 60 Minutes Intravenous Every 24 hours 10/22/16 0336 10/22/16 2045   10/22/16 2200  ceFAZolin (ANCEF) IVPB 2g/100 mL premix  Status:  Discontinued     2 g 200 mL/hr over 30 Minutes Intravenous Every 12 hours 10/22/16 2105 10/23/16 1617   10/22/16 0800  piperacillin-tazobactam (ZOSYN) IVPB 2.25 g  Status:  Discontinued     2.25 g 100 mL/hr over 30 Minutes Intravenous Every 6 hours 10/22/16 0336 10/22/16 0505   10/22/16 0800  piperacillin-tazobactam (ZOSYN) IVPB 3.375 g  Status:  Discontinued     3.375 g 12.5 mL/hr over 240 Minutes Intravenous Every 8 hours 10/22/16 0505 10/22/16 2045   10/22/16 0315  piperacillin-tazobactam (ZOSYN) IVPB 3.375 g  Status:  Discontinued     3.375 g 100 mL/hr over 30 Minutes Intravenous  Once 10/22/16 0302 10/22/16 0304   10/22/16 0315  vancomycin (VANCOCIN) IVPB 1000 mg/200 mL premix  Status:  Discontinued     1,000 mg 200 mL/hr over 60 Minutes Intravenous  Once 10/22/16 0302 10/22/16 0304   2016-10-28 2330  piperacillin-tazobactam (ZOSYN) IVPB 3.375 g     3.375 g 12.5 mL/hr over 240 Minutes Intravenous  Once 2016/10/28 2329 10/22/16 0444   October 28, 2016 2330  vancomycin (VANCOCIN) IVPB 1000 mg/200 mL premix     1,000 mg 200 mL/hr over 60 Minutes Intravenous  Once 10-28-16 2329 10/22/16 0148      Medications: Scheduled Meds: . nafcillin IV  2 g Intravenous Q4H  . nicotine  14 mg Transdermal Daily  . pantoprazole  40 mg Oral Daily  . potassium chloride  20 mEq  Oral TID  . predniSONE  40 mg Oral Q breakfast  . protein supplement shake  11 oz Oral BID BM  . sodium bicarbonate  650 mg Oral TID  . sodium chloride flush  3 mL Intravenous Q12H   Continuous Infusions: PRN Meds:.acetaminophen **OR** acetaminophen, gi cocktail, oxyCODONE, sodium chloride    Objective: Weight change: -3.6 oz (-0.103 kg)  Intake/Output Summary (Last 24 hours) at 10/25/16 1759 Last data filed at 10/25/16 1700  Gross per 24 hour  Intake             1865 ml  Output             5703 ml  Net            -3838 ml   Blood pressure (!) 135/55, pulse 100, temperature 98.7 F (37.1 C), temperature source Oral, resp. rate 18, height 5\' 9"  (1.753 m), weight 166 lb (75.3 kg), SpO2 97 %. Temp:  [98.2 F (36.8 C)-98.7 F (37.1 C)] 98.7 F (37.1 C) (11/25 1716) Pulse Rate:  [100-117] 100 (11/25 1716) Resp:  [18] 18 (11/25 1716) BP: (126-135)/(37-55) 135/55 (11/25 1716) SpO2:  [95 %-97 %] 97 % (11/25 1716) Weight:  [166 lb (75.3 kg)] 166  lb (75.3 kg) (11/25 0500)  Physical Exam: General: Alert and awake, oriented x3,   He was being washed up by rn staff  Oslers nodes stable  He has painful erythematous nodules and macules on both feet consistent with Osler's nodes. Does not have clear-cut splinter hemorrhages in his hands or feet  Right foot 10/23/2016:       10/24/16:        Left foot 10/23/2016:        10/24/16:      Right foot 10/23/2016:      Left foot 10/23/2016:          Neuro: nonfocal, strength and sensation intact   CBC Latest Ref Rng & Units 10/25/2016 10/24/2016 10/23/2016  WBC 4.0 - 10.5 K/uL 10.5 9.3 8.2  Hemoglobin 13.0 - 17.0 g/dL 1.6(X) 0.9(U) 0.4(V)  Hematocrit 39.0 - 52.0 % 26.5(L) 25.4(L) 25.6(L)  Platelets 150 - 400 K/uL 186 126(L) 91(L)      BMET  Recent Labs  10/24/16 0349 10/25/16 0513  NA 130* 136  K 5.2* 3.7  CL 105 107  CO2 14* 19*  GLUCOSE 112* 105*  BUN 156*  135*  CREATININE 3.08* 2.39*  CALCIUM 7.4* 7.3*     Liver Panel   Recent Labs  10/24/16 0349 10/25/16 0513  PROT 5.0* 5.1*  ALBUMIN 1.2* 1.3*  AST 65* 68*  ALT 41 39  ALKPHOS 106 110  BILITOT 1.9* 4.0*       Sedimentation Rate No results for input(s): ESRSEDRATE in the last 72 hours. C-Reactive Protein No results for input(s): CRP in the last 72 hours.  Micro Results: Recent Results (from the past 720 hour(s))  Blood culture (routine x 2)     Status: Abnormal   Collection Time: 10/22/16 12:01 AM  Result Value Ref Range Status   Specimen Description BLOOD RIGHT ARM  Final   Special Requests BOTTLES DRAWN AEROBIC AND ANAEROBIC  Final   Culture  Setup Time   Final    GRAM POSITIVE COCCI IN CLUSTERS IN BOTH AEROBIC AND ANAEROBIC BOTTLES CRITICAL VALUE NOTED.  VALUE IS CONSISTENT WITH PREVIOUSLY REPORTED AND CALLED VALUE. Performed at [facility]    Culture STAPHYLOCOCCUS AUREUS (A)  Final   Report Status 10/25/2016 FINAL  Final   Organism ID, Bacteria STAPHYLOCOCCUS AUREUS  Final      Susceptibility   Staphylococcus aureus - MIC*    CIPROFLOXACIN <=0.5 SENSITIVE Sensitive     ERYTHROMYCIN <=0.25 SENSITIVE Sensitive     GENTAMICIN <=0.5 SENSITIVE Sensitive     OXACILLIN <=0.25 SENSITIVE Sensitive     TETRACYCLINE <=1 SENSITIVE Sensitive     VANCOMYCIN 1 SENSITIVE Sensitive     TRIMETH/SULFA <=10 SENSITIVE Sensitive     CLINDAMYCIN <=0.25 SENSITIVE Sensitive     RIFAMPIN <=0.5 SENSITIVE Sensitive     Inducible Clindamycin NEGATIVE Sensitive     * STAPHYLOCOCCUS AUREUS  Blood culture (routine x 2)     Status: Abnormal (Preliminary result)   Collection Time: 10/22/16 12:20 AM  Result Value Ref Range Status   Specimen Description BLOOD LEFT ANTECUBITAL  Final   Special Requests BOTTLES DRAWN AEROBIC AND ANAEROBIC  Final   Culture  Setup Time   Final    GRAM POSITIVE COCCI IN CLUSTERS IN BOTH AEROBIC AND ANAEROBIC BOTTLES CRITICAL RESULT CALLED  TO, READ BACK BY AND VERIFIED WITHErling Cruz [facility] 2017 10/22/16 A BROWNING    Culture (A)  Final    STAPHYLOCOCCUS AUREUS SUSCEPTIBILITIES  PERFORMED ON PREVIOUS CULTURE WITHIN THE LAST 5 DAYS. Performed at [facility]    Report Status PENDING  Incomplete  Blood Culture ID Panel (Reflexed)     Status: Abnormal   Collection Time: 10/22/16 12:20 AM  Result Value Ref Range Status   Enterococcus species NOT DETECTED NOT DETECTED Final   Listeria monocytogenes NOT DETECTED NOT DETECTED Final   Staphylococcus species DETECTED (A) NOT DETECTED Final    Comment: CRITICAL RESULT CALLED TO, READ BACK BY AND VERIFIED WITH: E JACKSON PHARMD 2017 10/22/16 A BROWNING    Staphylococcus aureus DETECTED (A) NOT DETECTED Final    Comment: CRITICAL RESULT CALLED TO, READ BACK BY AND VERIFIED WITH: Erling Cruz [facility] 2017 10/22/16 A BROWNING    Methicillin resistance NOT DETECTED NOT DETECTED Final   Streptococcus species NOT DETECTED NOT DETECTED Final   Streptococcus agalactiae NOT DETECTED NOT DETECTED Final   Streptococcus pneumoniae NOT DETECTED NOT DETECTED Final   Streptococcus pyogenes NOT DETECTED NOT DETECTED Final   Acinetobacter baumannii NOT DETECTED NOT DETECTED Final   Enterobacteriaceae species NOT DETECTED NOT DETECTED Final   Enterobacter cloacae complex NOT DETECTED NOT DETECTED Final   Escherichia coli NOT DETECTED NOT DETECTED Final   Klebsiella oxytoca NOT DETECTED NOT DETECTED Final   Klebsiella pneumoniae NOT DETECTED NOT DETECTED Final   Proteus species NOT DETECTED NOT DETECTED Final   Serratia marcescens NOT DETECTED NOT DETECTED Final   Haemophilus influenzae NOT DETECTED NOT DETECTED Final   Neisseria meningitidis NOT DETECTED NOT DETECTED Final   Pseudomonas aeruginosa NOT DETECTED NOT DETECTED Final   Candida albicans NOT DETECTED NOT DETECTED Final   Candida glabrata NOT DETECTED NOT DETECTED Final   Candida krusei NOT DETECTED NOT DETECTED Final   Candida  parapsilosis NOT DETECTED NOT DETECTED Final   Candida tropicalis NOT DETECTED NOT DETECTED Final    Comment: Performed at [facility]  Culture, blood (Routine X 2) w Reflex to ID Panel     Status: None (Preliminary result)   Collection Time: 10/22/16 10:59 PM  Result Value Ref Range Status   Specimen Description BLOOD LEFT ANTECUBITAL  Final   Special Requests BOTTLES DRAWN AEROBIC AND ANAEROBIC 10 CC  Final   Culture   Final    NO GROWTH 2 DAYS Performed at [facility]    Report Status PENDING  Incomplete  Culture, Urine     Status: Abnormal   Collection Time: 10/22/16 10:59 PM  Result Value Ref Range Status   Specimen Description URINE, CLEAN CATCH  Final   Special Requests NONE  Final   Culture (A)  Final    70,000 COLONIES/mL YEAST Performed at [facility]    Report Status 10/24/2016 FINAL  Final  Culture, blood (Routine X 2) w Reflex to ID Panel     Status: None (Preliminary result)   Collection Time: 10/24/16  7:31 PM  Result Value Ref Range Status   Specimen Description BLOOD LEFT ANTECUBITAL  Final   Special Requests BOTTLES DRAWN AEROBIC AND ANAEROBIC 10 CC  Final   Culture NO GROWTH < 24 HOURS  Final   Report Status PENDING  Incomplete  Culture, blood (Routine X 2) w Reflex to ID Panel     Status: None (Preliminary result)   Collection Time: 10/24/16  7:36 PM  Result Value Ref Range Status   Specimen Description BLOOD LEFT HAND  Final   Special Requests IN PEDIATRIC BOTTLE 4CC  Final   Culture  Setup  Time   Final    GRAM POSITIVE COCCI IN CLUSTERS IN PEDIATRIC BOTTLE CRITICAL RESULT CALLED TO, READ BACK BY AND VERIFIED WITH: C BALL,PHARMD AT 1448 10/25/16 BY L BENFIELD    Culture GRAM POSITIVE COCCI  Final   Report Status PENDING  Incomplete  Blood Culture ID Panel (Reflexed)     Status: Abnormal   Collection Time: 10/24/16  7:36 PM  Result Value Ref Range Status   Enterococcus species NOT DETECTED NOT DETECTED Final   Listeria  monocytogenes NOT DETECTED NOT DETECTED Final   Staphylococcus species DETECTED (A) NOT DETECTED Final    Comment: CRITICAL RESULT CALLED TO, READ BACK BY AND VERIFIED WITH: C BALL,PHARMD AT 1448 10/25/16 BY L BENFIELD    Staphylococcus aureus DETECTED (A) NOT DETECTED Final    Comment: CRITICAL RESULT CALLED TO, READ BACK BY AND VERIFIED WITH: C BALL,PHARMD AT 1448 10/25/16 BY L BENFIELD    Methicillin resistance NOT DETECTED NOT DETECTED Final   Streptococcus species NOT DETECTED NOT DETECTED Final   Streptococcus agalactiae NOT DETECTED NOT DETECTED Final   Streptococcus pneumoniae NOT DETECTED NOT DETECTED Final   Streptococcus pyogenes NOT DETECTED NOT DETECTED Final   Acinetobacter baumannii NOT DETECTED NOT DETECTED Final   Enterobacteriaceae species NOT DETECTED NOT DETECTED Final   Enterobacter cloacae complex NOT DETECTED NOT DETECTED Final   Escherichia coli NOT DETECTED NOT DETECTED Final   Klebsiella oxytoca NOT DETECTED NOT DETECTED Final   Klebsiella pneumoniae NOT DETECTED NOT DETECTED Final   Proteus species NOT DETECTED NOT DETECTED Final   Serratia marcescens NOT DETECTED NOT DETECTED Final   Haemophilus influenzae NOT DETECTED NOT DETECTED Final   Neisseria meningitidis NOT DETECTED NOT DETECTED Final   Pseudomonas aeruginosa NOT DETECTED NOT DETECTED Final   Candida albicans NOT DETECTED NOT DETECTED Final   Candida glabrata NOT DETECTED NOT DETECTED Final   Candida krusei NOT DETECTED NOT DETECTED Final   Candida parapsilosis NOT DETECTED NOT DETECTED Final   Candida tropicalis NOT DETECTED NOT DETECTED Final  C difficile quick scan w PCR reflex     Status: None   Collection Time: 10/25/16  8:20 AM  Result Value Ref Range Status   C Diff antigen NEGATIVE NEGATIVE Final   C Diff toxin NEGATIVE NEGATIVE Final   C Diff interpretation No C. difficile detected.  Final  Culture, blood (Routine X 2) w Reflex to ID Panel     Status: None (Preliminary result)    Collection Time: 10/25/16  2:00 PM  Result Value Ref Range Status   Specimen Description BLOOD BLOOD LEFT ARM  Final   Special Requests BOTTLES DRAWN AEROBIC AND ANAEROBIC 10CC  Final   Culture NO GROWTH <12 HOURS  Final   Report Status PENDING  Incomplete  Culture, blood (Routine X 2) w Reflex to ID Panel     Status: None (Preliminary result)   Collection Time: 10/25/16  2:15 PM  Result Value Ref Range Status   Specimen Description BLOOD BLOOD LEFT HAND  Final   Special Requests BOTTLES DRAWN AEROBIC AND ANAEROBIC 5CC  Final   Culture NO GROWTH <12 HOURS  Final   Report Status PENDING  Incomplete    Studies/Results: US Renal  Result Date: 10/24/2016 CLINICAL DATA:  45 y/o  M; acute renal failure. EXAM: RENAL / URINARY TRACT ULTRASOUND COMPLETE COMPARISON:  None. FINDINGS: Right Kidney: Length: 12.8 cm. Echogenicity within normal limits. No hydronephrosis. Lower pole well-circumscribed anechoic 1.2 x 1.2 x 1.3 cm lesion with  enhanced through transmission and no appreciable vascularity on color Doppler compatible with simple cyst. Left Kidney: Length: 12.4 cm. Echogenicity within normal limits. No mass or hydronephrosis visualized. Bladder: Foley catheter in situ. Bilateral pleural effusions partially visualized. Trace perihepatic ascites. IMPRESSION: 1. No mass or hydronephrosis of the kidneys. 2. Bilateral pleural effusions and trace perihepatic ascites. Electronically Signed   By: Mitzi HansenLance  Furusawa-Stratton M.D.   On: 10/24/2016 00:41   Dg Chest Port 1 View  Result Date: 10/24/2016 CLINICAL DATA:  Dyspnea. EXAM: PORTABLE CHEST 1 VIEW COMPARISON:  09/06/2016 FINDINGS: Development of bilateral patchy airspace disease, right side greater than left. The most confluent airspace disease is near the right lung apex and the right lung base. Heart size is stable and within normal limits for size. The trachea is midline. Probable left pleural effusion. Cannot exclude a small right pleural effusion.  IMPRESSION: Development of bilateral airspace disease, right side greater than left. Differential diagnosis would include diffuse pneumonia versus asymmetric pulmonary edema. Probable small pleural effusions, left side greater than right. Electronically Signed   By: Richarda OverlieAdam  Henn M.D.   On: 10/24/2016 09:39      Assessment/Plan:  INTERVAL HISTORY: With worsening respiratory distress  Copious diarrhea   Principal Problem:   Cellulitis Active Problems:   Hyperkalemia   Elevated liver enzymes   ARF (acute renal failure) (HCC)   Hepatitis C antibody test positive   Tobacco abuse   Hyponatremia   Hyperglycemia   Hypoalbuminemia due to protein-calorie malnutrition (HCC)   Normocytic anemia   Thrombocytopenia (HCC)   IVDU (intravenous drug user)   History of hepatitis C   Staphylococcus aureus bacteremia with sepsis (HCC)   Endocarditis of tricuspid valve   Aortic valve endocarditis   Severe aortic regurgitation   Severe tricuspid regurgitation   Osler's node   Septic embolism (HCC)   Hypoxemia    Nathaniel ManJames S Ravenscroft is a 45 y.o. male with   With relapsed IV drug use that is ongoing and methicillin sensitive staph aureus bacteremia with both right and left sided endocarditis with severe aortic regurgitation and moderate to severe tricuspid regurgitation with significant sized vegetations on both valves, Osler's nodes and concern for embolization to the kidneys given his creatinine is now 4 range. Not clearing his blood cultures yet in his respiratory distress is worsening  #1 Right and left sided endocarditis with concern for septic embolization to the kidney and also other potential organs. He has Osler's nodes on exam as well.  Repeat blood cultures yet again but still  +   Will repeat again  Try to keep him without a central line until we can clear his blood cultures of this may not be possible if he worsens clinically.  If not he will then later need a "central line  holiday.  He needs a transesophageal echocardiogram along with formal cardiology and cardiothoracic surgery consult.   ( I discussed case w Dr Gus HeightBA RTHLE TODAY)    #2 respiratory distress: could be multifactorial with renal failure and volume overload playing a role. Also would not doubt that he is embolizing his tricuspid vegetation into his lungs. ABG  showed significant hypoxemia  #3 acute renal failure could be due partly to prerenal failure setting of sepsis but I suspect he is also embolizing to his kidneys  #4 active IV drug use will recheck him for HIV hepatitis B and hepatitis C RNA. He tells me that he will be able to stop but he will clearly need  intense help in stopping this dangerous habit  #5 history of chronic hepatitis C infection the past status post treatment with HARVONI hopefully is not reacquired infection  #6 Diarrhea: fortunately not CDI   LOS: 4 days   Acey LavCornelius Van Dam 10/25/2016, 5:59 PM

## 2016-10-25 NOTE — Progress Notes (Signed)
PHARMACY - PHYSICIAN COMMUNICATION CRITICAL VALUE ALERT - BLOOD CULTURE IDENTIFICATION (BCID)  Results for orders placed or performed during the hospital encounter of 10/04/2016  Blood Culture ID Panel (Reflexed) (Collected: 10/24/2016  7:36 PM)  Result Value Ref Range   Enterococcus species NOT DETECTED NOT DETECTED   Listeria monocytogenes NOT DETECTED NOT DETECTED   Staphylococcus species DETECTED (A) NOT DETECTED   Staphylococcus aureus DETECTED (A) NOT DETECTED   Methicillin resistance NOT DETECTED NOT DETECTED   Streptococcus species NOT DETECTED NOT DETECTED   Streptococcus agalactiae NOT DETECTED NOT DETECTED   Streptococcus pneumoniae NOT DETECTED NOT DETECTED   Streptococcus pyogenes NOT DETECTED NOT DETECTED   Acinetobacter baumannii NOT DETECTED NOT DETECTED   Enterobacteriaceae species NOT DETECTED NOT DETECTED   Enterobacter cloacae complex NOT DETECTED NOT DETECTED   Escherichia coli NOT DETECTED NOT DETECTED   Klebsiella oxytoca NOT DETECTED NOT DETECTED   Klebsiella pneumoniae NOT DETECTED NOT DETECTED   Proteus species NOT DETECTED NOT DETECTED   Serratia marcescens NOT DETECTED NOT DETECTED   Haemophilus influenzae NOT DETECTED NOT DETECTED   Neisseria meningitidis NOT DETECTED NOT DETECTED   Pseudomonas aeruginosa NOT DETECTED NOT DETECTED   Candida albicans NOT DETECTED NOT DETECTED   Candida glabrata NOT DETECTED NOT DETECTED   Candida krusei NOT DETECTED NOT DETECTED   Candida parapsilosis NOT DETECTED NOT DETECTED   Candida tropicalis NOT DETECTED NOT DETECTED    Name of physician (or Provider) Contacted: Dr. Osvaldo ShipperGokul Krishnan  Changes to prescribed antibiotics required: No changes needed.  Continues on Nafcillin IV as recommended by ID.  Noah Delaineuth Avalon Coppinger, RPh Clinical Pharmacist Pager: [phone] 10/25/2016  3:07 PM

## 2016-10-25 NOTE — Progress Notes (Signed)
Patients C-diff panel was negative. MD notified and enteric precautions discontinued. Rectal tube in place. Will continue to monitor patient. Ariona Deschene BSN,RN

## 2016-10-25 NOTE — Progress Notes (Signed)
Patient has given staff permission to disclose information regarding his health to his sister Kendal HymenBonnie.

## 2016-10-26 ENCOUNTER — Inpatient Hospital Stay (HOSPITAL_COMMUNITY): Payer: Medicaid Other

## 2016-10-26 DIAGNOSIS — L02619 Cutaneous abscess of unspecified foot: Secondary | ICD-10-CM

## 2016-10-26 DIAGNOSIS — N179 Acute kidney failure, unspecified: Secondary | ICD-10-CM

## 2016-10-26 DIAGNOSIS — R570 Cardiogenic shock: Secondary | ICD-10-CM

## 2016-10-26 DIAGNOSIS — I351 Nonrheumatic aortic (valve) insufficiency: Secondary | ICD-10-CM

## 2016-10-26 DIAGNOSIS — IMO0002 Reserved for concepts with insufficient information to code with codable children: Secondary | ICD-10-CM

## 2016-10-26 DIAGNOSIS — L03119 Cellulitis of unspecified part of limb: Secondary | ICD-10-CM

## 2016-10-26 DIAGNOSIS — I358 Other nonrheumatic aortic valve disorders: Secondary | ICD-10-CM

## 2016-10-26 DIAGNOSIS — I071 Rheumatic tricuspid insufficiency: Secondary | ICD-10-CM

## 2016-10-26 DIAGNOSIS — J9601 Acute respiratory failure with hypoxia: Secondary | ICD-10-CM

## 2016-10-26 DIAGNOSIS — R7881 Bacteremia: Secondary | ICD-10-CM

## 2016-10-26 DIAGNOSIS — I330 Acute and subacute infective endocarditis: Secondary | ICD-10-CM

## 2016-10-26 LAB — CBC
HCT: 21.5 % — ABNORMAL LOW (ref 39.0–52.0)
HEMATOCRIT: 25.1 % — AB (ref 39.0–52.0)
HEMOGLOBIN: 7.3 g/dL — AB (ref 13.0–17.0)
Hemoglobin: 8.6 g/dL — ABNORMAL LOW (ref 13.0–17.0)
MCH: 27.7 pg (ref 26.0–34.0)
MCH: 27.8 pg (ref 26.0–34.0)
MCHC: 34.3 g/dL (ref 30.0–36.0)
MCHC: 34 g/dL (ref 30.0–36.0)
MCV: 81 fL (ref 78.0–100.0)
MCV: 81.7 fL (ref 78.0–100.0)
PLATELETS: 182 10*3/uL (ref 150–400)
Platelets: 235 10*3/uL (ref 150–400)
RBC: 3.1 MIL/uL — ABNORMAL LOW (ref 4.22–5.81)
RBC: 2.63 MIL/uL — AB (ref 4.22–5.81)
RDW: 15.4 % (ref 11.5–15.5)
RDW: 15.5 % (ref 11.5–15.5)
WBC: 14 10*3/uL — AB (ref 4.0–10.5)
WBC: 11.4 10*3/uL — AB (ref 4.0–10.5)

## 2016-10-26 LAB — BLOOD GAS, ARTERIAL
Acid-base deficit: 5.8 mmol/L — ABNORMAL HIGH (ref 0.0–2.0)
BICARBONATE: 18.9 mmol/L — AB (ref 20.0–28.0)
Drawn by: 270221
FIO2: 100
LHR: 20 {breaths}/min
O2 Saturation: 73.6 %
PATIENT TEMPERATURE: 98.6
PEEP/CPAP: 5 cmH2O
PO2 ART: 45.7 mmHg — AB (ref 83.0–108.0)
VT: 570 mL
pCO2 arterial: 36.5 mmHg (ref 32.0–48.0)
pH, Arterial: 7.335 — ABNORMAL LOW (ref 7.350–7.450)

## 2016-10-26 LAB — COMPREHENSIVE METABOLIC PANEL
ALBUMIN: 1.1 g/dL — AB (ref 3.5–5.0)
ALT: 22 U/L (ref 17–63)
ANION GAP: 9 (ref 5–15)
AST: 39 U/L (ref 15–41)
Alkaline Phosphatase: 79 U/L (ref 38–126)
BUN: 116 mg/dL — AB (ref 6–20)
CHLORIDE: 107 mmol/L (ref 101–111)
CO2: 19 mmol/L — ABNORMAL LOW (ref 22–32)
Calcium: 6.8 mg/dL — ABNORMAL LOW (ref 8.9–10.3)
Creatinine, Ser: 2.12 mg/dL — ABNORMAL HIGH (ref 0.61–1.24)
GFR calc Af Amer: 42 mL/min — ABNORMAL LOW (ref >60)
GFR calc non Af Amer: 36 mL/min — ABNORMAL LOW (ref >60)
GLUCOSE: 135 mg/dL — AB (ref 65–99)
POTASSIUM: 3.6 mmol/L (ref 3.5–5.1)
Sodium: 135 mmol/L (ref 135–145)
Total Bilirubin: 3.2 mg/dL — ABNORMAL HIGH (ref 0.3–1.2)
Total Protein: 4.7 g/dL — ABNORMAL LOW (ref 6.5–8.1)

## 2016-10-26 LAB — BASIC METABOLIC PANEL
ANION GAP: 11 (ref 5–15)
BUN: 120 mg/dL — ABNORMAL HIGH (ref 6–20)
CALCIUM: 7.3 mg/dL — AB (ref 8.9–10.3)
CO2: 18 mmol/L — AB (ref 22–32)
Chloride: 105 mmol/L (ref 101–111)
Creatinine, Ser: 2.03 mg/dL — ABNORMAL HIGH (ref 0.61–1.24)
GFR, EST AFRICAN AMERICAN: 44 mL/min — AB (ref >60)
GFR, EST NON AFRICAN AMERICAN: 38 mL/min — AB (ref >60)
Glucose, Bld: 129 mg/dL — ABNORMAL HIGH (ref 65–99)
Potassium: 3.4 mmol/L — ABNORMAL LOW (ref 3.5–5.1)
SODIUM: 134 mmol/L — AB (ref 135–145)

## 2016-10-26 LAB — LACTIC ACID, PLASMA: LACTIC ACID, VENOUS: 2.8 mmol/L — AB (ref 0.5–1.9)

## 2016-10-26 LAB — CULTURE, BLOOD (ROUTINE X 2)

## 2016-10-26 LAB — GLUCOSE, CAPILLARY
GLUCOSE-CAPILLARY: 124 mg/dL — AB (ref 65–99)
GLUCOSE-CAPILLARY: 141 mg/dL — AB (ref 65–99)
GLUCOSE-CAPILLARY: 119 mg/dL — AB (ref 65–99)
Glucose-Capillary: 96 mg/dL (ref 65–99)

## 2016-10-26 LAB — MRSA PCR SCREENING: MRSA by PCR: NEGATIVE

## 2016-10-26 LAB — MAGNESIUM
MAGNESIUM: 2.5 mg/dL — AB (ref 1.7–2.4)
Magnesium: 2.7 mg/dL — ABNORMAL HIGH (ref 1.7–2.4)

## 2016-10-26 LAB — APTT: APTT: 32 s (ref 24–36)

## 2016-10-26 LAB — PROTIME-INR
INR: 1.92
PROTHROMBIN TIME: 22.3 s — AB (ref 11.4–15.2)

## 2016-10-26 LAB — PHOSPHORUS
Phosphorus: 6.8 mg/dL — ABNORMAL HIGH (ref 2.5–4.6)
Phosphorus: 7.2 mg/dL — ABNORMAL HIGH (ref 2.5–4.6)

## 2016-10-26 LAB — C4 COMPLEMENT: COMPLEMENT C4, BODY FLUID: 7 mg/dL — AB (ref 14–44)

## 2016-10-26 LAB — C3 COMPLEMENT: C3 COMPLEMENT: 37 mg/dL — AB (ref 82–167)

## 2016-10-26 MED ORDER — PROPOFOL 1000 MG/100ML IV EMUL
INTRAVENOUS | Status: AC
  Filled 2016-10-26: qty 100

## 2016-10-26 MED ORDER — PRO-STAT SUGAR FREE PO LIQD
30.0000 mL | Freq: Two times a day (BID) | ORAL | Status: DC
  Administered 2016-10-26 – 2016-10-27 (×2): 30 mL
  Filled 2016-10-26 (×4): qty 30

## 2016-10-26 MED ORDER — HEPARIN SODIUM (PORCINE) 5000 UNIT/ML IJ SOLN
5000.0000 [IU] | Freq: Three times a day (TID) | INTRAMUSCULAR | Status: DC
  Administered 2016-10-26 – 2016-10-29 (×9): 5000 [IU] via SUBCUTANEOUS
  Filled 2016-10-26 (×9): qty 1

## 2016-10-26 MED ORDER — FENTANYL CITRATE (PF) 100 MCG/2ML IJ SOLN
INTRAMUSCULAR | Status: AC
  Administered 2016-10-26: 100 ug via INTRAVENOUS
  Filled 2016-10-26: qty 4

## 2016-10-26 MED ORDER — ASPIRIN 81 MG PO CHEW: 324.0000 mg | CHEWABLE_TABLET | ORAL | Status: AC

## 2016-10-26 MED ORDER — MIDAZOLAM HCL 2 MG/2ML IJ SOLN
2.0000 mg | INTRAMUSCULAR | Status: DC | PRN
  Filled 2016-10-26 (×3): qty 2

## 2016-10-26 MED ORDER — MIDAZOLAM HCL 2 MG/2ML IJ SOLN
2.0000 mg | Freq: Once | INTRAMUSCULAR | Status: AC
  Administered 2016-10-26: 2 mg via INTRAVENOUS

## 2016-10-26 MED ORDER — SODIUM CHLORIDE 0.9 % IV SOLN
250.0000 mL | INTRAVENOUS | Status: DC | PRN
  Administered 2016-10-26 – 2016-10-30 (×2): 250 mL via INTRAVENOUS

## 2016-10-26 MED ORDER — CHLORHEXIDINE GLUCONATE 0.12% ORAL RINSE (MEDLINE KIT)
15.0000 mL | Freq: Two times a day (BID) | OROMUCOSAL | Status: DC
  Administered 2016-10-26 – 2016-10-31 (×10): 15 mL via OROMUCOSAL

## 2016-10-26 MED ORDER — FENTANYL CITRATE (PF) 100 MCG/2ML IJ SOLN
100.0000 ug | Freq: Once | INTRAMUSCULAR | Status: AC
  Administered 2016-10-26: 100 ug via INTRAVENOUS

## 2016-10-26 MED ORDER — FENTANYL CITRATE (PF) 100 MCG/2ML IJ SOLN
50.0000 ug | Freq: Once | INTRAMUSCULAR | Status: AC
  Administered 2016-10-26: 100 ug via INTRAVENOUS

## 2016-10-26 MED ORDER — FENTANYL CITRATE (PF) 100 MCG/2ML IJ SOLN
INTRAMUSCULAR | Status: AC
  Administered 2016-10-26: 100 ug
  Filled 2016-10-26: qty 2

## 2016-10-26 MED ORDER — FAMOTIDINE IN NACL 20-0.9 MG/50ML-% IV SOLN
20.0000 mg | INTRAVENOUS | Status: DC
  Administered 2016-10-26 – 2016-10-28 (×3): 20 mg via INTRAVENOUS
  Filled 2016-10-26 (×5): qty 50

## 2016-10-26 MED ORDER — FENTANYL 2500MCG IN NS 250ML (10MCG/ML) PREMIX INFUSION
25.0000 ug/h | INTRAVENOUS | Status: DC
Start: 2016-10-26 — End: 2016-10-29
  Administered 2016-10-26: 200 ug/h via INTRAVENOUS
  Administered 2016-10-26: 250 ug/h via INTRAVENOUS
  Administered 2016-10-27 – 2016-10-28 (×4): 200 ug/h via INTRAVENOUS
  Administered 2016-10-29: 150 ug/h via INTRAVENOUS
  Filled 2016-10-26 (×7): qty 250

## 2016-10-26 MED ORDER — VITAL HIGH PROTEIN PO LIQD: 1000.0000 mL | ORAL | Status: DC

## 2016-10-26 MED ORDER — FENTANYL CITRATE (PF) 100 MCG/2ML IJ SOLN
INTRAMUSCULAR | Status: AC
  Administered 2016-10-26: 13:00:00
  Filled 2016-10-26: qty 2

## 2016-10-26 MED ORDER — MIDAZOLAM HCL 2 MG/2ML IJ SOLN
2.0000 mg | INTRAMUSCULAR | Status: DC | PRN
  Filled 2016-10-26 (×2): qty 2

## 2016-10-26 MED ORDER — FENTANYL CITRATE (PF) 2500 MCG/50ML IJ SOLN
25.0000 ug/h | INTRAMUSCULAR | Status: DC
  Filled 2016-10-26: qty 50

## 2016-10-26 MED ORDER — PROPOFOL 1000 MG/100ML IV EMUL: 5.0000 ug/kg/min | INTRAVENOUS | Status: DC

## 2016-10-26 MED ORDER — ETOMIDATE 2 MG/ML IV SOLN
20.0000 mg | Freq: Once | INTRAVENOUS | Status: AC
  Administered 2016-10-26: 20 mg via INTRAVENOUS

## 2016-10-26 MED ORDER — DEXTROSE 5 % IV SOLN
0.0000 ug/min | INTRAVENOUS | Status: DC
  Administered 2016-10-26 – 2016-10-27 (×2): 5 ug/min via INTRAVENOUS
  Administered 2016-10-27: 7 ug/min via INTRAVENOUS
  Administered 2016-10-28 (×2): 6 ug/min via INTRAVENOUS
  Administered 2016-10-29: 10 ug/min via INTRAVENOUS
  Administered 2016-10-29: 9 ug/min via INTRAVENOUS
  Administered 2016-10-29: 10 ug/min via INTRAVENOUS
  Administered 2016-10-29 – 2016-10-30 (×2): 9 ug/min via INTRAVENOUS
  Filled 2016-10-26 (×9): qty 4

## 2016-10-26 MED ORDER — SODIUM CHLORIDE 0.9 % IV SOLN
Freq: Once | INTRAVENOUS | Status: AC
  Administered 2016-10-26: 15:00:00 via INTRAVENOUS

## 2016-10-26 MED ORDER — ORAL CARE MOUTH RINSE
15.0000 mL | Freq: Four times a day (QID) | OROMUCOSAL | Status: DC
  Administered 2016-10-26 – 2016-10-31 (×18): 15 mL via OROMUCOSAL

## 2016-10-26 MED ORDER — LORAZEPAM 1 MG PO TABS
1.0000 mg | ORAL_TABLET | Freq: Once | ORAL | Status: AC
  Administered 2016-10-26: 1 mg via ORAL
  Filled 2016-10-26: qty 1

## 2016-10-26 MED ORDER — FENTANYL BOLUS VIA INFUSION
50.0000 ug | INTRAVENOUS | Status: DC | PRN
  Administered 2016-10-26: 50 ug via INTRAVENOUS
  Filled 2016-10-26: qty 50

## 2016-10-26 MED ORDER — ASPIRIN 300 MG RE SUPP
300.0000 mg | RECTAL | Status: AC
  Filled 2016-10-26: qty 1

## 2016-10-26 MED ORDER — WHITE PETROLATUM GEL
Status: AC
  Administered 2016-10-26: 05:00:00
  Filled 2016-10-26: qty 1

## 2016-10-26 MED ORDER — MIDAZOLAM HCL 5 MG/ML IJ SOLN
1.0000 mg/h | INTRAMUSCULAR | Status: DC
  Administered 2016-10-26: 3 mg/h via INTRAVENOUS
  Administered 2016-10-26 – 2016-10-27 (×2): 4 mg/h via INTRAVENOUS
  Administered 2016-10-27 – 2016-10-28 (×2): 6 mg/h via INTRAVENOUS
  Filled 2016-10-26 (×5): qty 10

## 2016-10-26 MED ORDER — SUCCINYLCHOLINE CHLORIDE 20 MG/ML IJ SOLN
60.0000 mg | Freq: Once | INTRAMUSCULAR | Status: AC
  Administered 2016-10-26: 60 mg via INTRAVENOUS

## 2016-10-26 NOTE — Progress Notes (Signed)
Pharmacy Antibiotic Note- renal dose adjustment of antibiotics  Joel Sheppard is a 45 y.o. male with relapse of IV drug abuse admitted on 03-17-2016 with cellulitis. Diagnosed with MSSA bacteremia with both Right and left sided endocarditis with concern for septic embolization to the kidney and also other potential organs.   Pharmacy Consult to adjust antibiotic dose for renal dysfunction.  He is currently on day # 5 of antibiotics.  On Nafcillin 2g IV q4h . Nafcillin does not need adjustments for renal dysfunction.   Plan: Nafcillin does not need adjustments for renal dysfunction.  Continue nafcillin 2g IV q4h as ordered per ID consult.    Height: 5\' 9"  (175.3 cm) Weight: 165 lb 12.6 oz (75.2 kg) IBW/kg (Calculated) : 70.7  Temp (24hrs), Avg:98.3 F (36.8 C), Min:97.9 F (36.6 C), Max:98.7 F (37.1 C)   Recent Labs Lab 02-Nov-2016 2104 02-Nov-2016 2105 10/22/16 0038 10/22/16 0527 10/23/16 0507 10/24/16 0349 10/25/16 0513 10/26/16 0735  WBC  --   --   --  15.7* 8.2 9.3 10.5 14.0*  CREATININE  --   --   --  4.31* 4.12* 3.08* 2.39* 2.03*  LATICACIDVEN 1.75 1.71 1.31  --   --   --   --   --     Estimated Creatinine Clearance: 46 mL/min (by C-G formula based on SCr of 2.03 mg/dL (H)).    No Known Allergies  Antimicrobials this admission: 11/ 22 zosyn >> 11/22 11/22  vancomycin >> 11/22 11/22 cefazolin >> 11/23 11/23 nafcillin >>   Dose adjustments this admission:   Microbiology results: 11/22 BCx x2:  MSSA 11/24 BCX x2 GPC clusters  11/24 BCID:  staph aureus detected   Methicillin resistance NOT detected 11/25 Cdiff PCR : negative 11/25 BCx x2: sent    Thank you for allowing pharmacy to be a part of this patient's care. Noah Delaineuth Maxmillian Carsey, RPh Clinical Pharmacist 10/26/2016 11:20 AM

## 2016-10-26 NOTE — Progress Notes (Signed)
PT Cancellation Note  Patient Details Name: Joel Sheppard MRN: [id number] DOB: 05/11/1971   Cancelled Treatment:    Reason Eval/Treat Not Completed: Patient declined, no reason specified;Medical issues which prohibited therapy.  Pt refused stated "it just can't happen today"  And pt having lots of diarrhea.  Will try 11/27 as able. 10/26/2016  [place] BingKen Kayliana Sheppard, PT [phone]  (pager)   Eliseo GumKenneth V Scotlynn Sheppard 10/26/2016, 11:41 AM

## 2016-10-26 NOTE — Procedures (Signed)
Intubation Procedure Note Nathaniel ManJames S Feltner [id number] 02/14/1971  Procedure: Intubation Indications: Respiratory insufficiency   Pt with worsening resp status related to infective endocarditis and MSSA bacteremia.   Procedure Details Consent: Risks of procedure as well as the alternatives and risks of each were explained to the (patient/caregiver).  Consent for procedure obtained. Time Out: Verified patient identification, verified procedure, site/side was marked, verified correct patient position, special equipment/implants available, medications/allergies/relevent history reviewed, required imaging and test results available.  Performed  Maximum sterile technique was used including antiseptics, gloves, gown, mask and sheet.  MAC and 3  Versed 4 mg, fentanyl 200 mcg, etomidate 20 mg, succinylcholine 60 mg >> al IV and divided doses.   Evaluation Hemodynamic Status: BP stable throughout; O2 sats: stable throughout Patient's Current Condition: stable Complications: No apparent complications Patient did tolerate procedure well. Chest X-ray ordered to verify placement.  CXR: pending.   Errika Narvaiz Bridgette Habermannngelo A De West HomesteadDios 10/26/2016

## 2016-10-26 NOTE — Progress Notes (Signed)
CRITICAL VALUE ALERT  Critical value received:  Lactic acid  Date of notification: 10/26/2016   Time of notification:  1426   Critical value read back:yes   Nurse who received alert:  Philippa SicksMelinda Veta Dambrosia  MD notified (1st page):  Randon GoldsmithKatie Whiteheart  Time of first page:  1445 MD notified (2nd page):  Time of second page:  Responding MD: Florentina Addisonkatie whieteheart  Time MD responded:  [phone]

## 2016-10-26 NOTE — Consult Note (Signed)
PULMONARY / CRITICAL CARE MEDICINE   Name: Nathaniel ManJames S Karn MRN: [id number] DOB: 12/27/1970    ADMISSION DATE:  10/09/2016 CONSULTATION DATE:  11/26  REFERRING MD:  Rito EhrlichKrishnan ([facility])   CHIEF COMPLAINT:  Respiratory distress   HISTORY OF PRESENT ILLNESS:   [age]yo male with hx Hep C, IVDA with recent hospitalization for heroin overdose initially admitted 11/21 with L foot and R tibial cellulitis.  He was found to have MSSA bacteremia with both R and L sided endocarditis and severe Aortic regurg and severe TR with significant sized vegetations on both valves as well as concern for septic emboli to the kidneys.  Has been on IV abx, followed by ID with ongoing positive blood cultures.  On 11/26 had progressive respiratory distress and requiring tx to ICU and urgent intubation with concern for septic pulmonary emboli.    PAST MEDICAL HISTORY :  He  has a past medical history of Hepatitis C; Heroin overdose (09/05/2016); and IV drug abuse.  PAST SURGICAL HISTORY: He  has a past surgical history that includes Leg Surgery.  No Known Allergies  No current facility-administered medications on file prior to encounter.    Current Outpatient Prescriptions on File Prior to Encounter  Medication Sig  . ibuprofen (ADVIL,MOTRIN) 200 MG tablet Take 200-400 mg by mouth every 6 (six) hours as needed (for pain).  . propranolol (INDERAL) 10 MG tablet Take 10 mg by mouth 3 (three) times daily as needed (anxiety).   . QUEtiapine (SEROQUEL) 300 MG tablet Take 300 mg by mouth at bedtime.    FAMILY HISTORY:  His indicated that the status of his mother is unknown. He indicated that his father is deceased.    SOCIAL HISTORY: He  reports that he has been smoking Cigarettes.  He has been smoking about 1.00 pack per day. He has never used smokeless tobacco. He reports that he uses drugs, including Cocaine, IV, and Heroin, about 7 times per week. He reports that he does not drink alcohol.  REVIEW OF SYSTEMS:    Unable, pt now sedated on vent post urgent intubation.    SUBJECTIVE:    VITAL SIGNS: BP (!) 115/57 (BP Location: Left Arm)   Pulse (!) 106   Temp 98.4 F (36.9 C) (Oral)   Resp 18   Ht 5\' 9"  (1.753 m)   Wt 75.2 kg (165 lb 12.6 oz)   SpO2 95%   BMI 24.48 kg/m   HEMODYNAMICS:    VENTILATOR SETTINGS:    INTAKE / OUTPUT: I/O last 3 completed shifts: In: 2605 [P.O.:2402; I.V.:3; IV Piggyback:200] Out: 7003 [Urine:7000; Stool:3]  PHYSICAL EXAMINATION: General:  Young male, NAD post intubation, initially had significant respiratory distress and increased WOB  Neuro:  Sedated, RASS -2, was VERY difficult to sedate  HEENT:  Mm moist, ETT  Cardiovascular:  s1s2 rrr Lungs:  resps even non labored on vent, coarse  Abdomen:  Soft, +bs  Musculoskeletal:  Warm and dry, no edema   LABS:  BMET  Recent Labs Lab 10/24/16 0349 10/25/16 0513 10/26/16 0735  NA 130* 136 134*  K 5.2* 3.7 3.4*  CL 105 107 105  CO2 14* 19* 18*  BUN 156* 135* 120*  CREATININE 3.08* 2.39* 2.03*  GLUCOSE 112* 105* 129*    Electrolytes  Recent Labs Lab 10/22/16 0527  10/24/16 0349 10/25/16 0513 10/26/16 0735  CALCIUM 7.5*  < > 7.4* 7.3* 7.3*  MG 2.8*  --   --   --   --  PHOS 9.0*  --   --   --   --   < > = values in this interval not displayed.  CBC  Recent Labs Lab 10/24/16 0349 10/25/16 0513 10/26/16 0735  WBC 9.3 10.5 14.0*  HGB 8.8* 9.3* 8.6*  HCT 25.4* 26.5* 25.1*  PLT 126* 186 235    Coag's  Recent Labs Lab 10/22/16 0527  INR 1.33    Sepsis Markers  Recent Labs Lab 10/29/2016 2104 10/25/2016 2105 10/22/16 0038  LATICACIDVEN 1.75 1.71 1.31    ABG  Recent Labs Lab 10/24/16 1412  PHART 7.405  PCO2ART 25.0*  PO2ART 70.0*    Liver Enzymes  Recent Labs Lab 10/22/16 0527 10/24/16 0349 10/25/16 0513  AST 61* 65* 68*  ALT 60 41 39  ALKPHOS 134* 106 110  BILITOT 3.0* 1.9* 4.0*  ALBUMIN 1.6* 1.2* 1.3*    Cardiac Enzymes No results for input(s):  TROPONINI, PROBNP in the last 168 hours.  Glucose No results for input(s): GLUCAP in the last 168 hours.  Imaging Dg Chest Port 1 View  Result Date: 10/26/2016 CLINICAL DATA:  Dyspnea and left-sided chest pain EXAM: PORTABLE CHEST 1 VIEW COMPARISON:  10/24/2016 FINDINGS: Cardiac shadow is stable. Bilateral infiltrates are again identified and stable. No sizable effusion is seen. No bony abnormality is noted. IMPRESSION: No change in bilateral infiltrates. Electronically Signed   By: Alcide Clever M.D.   On: 10/26/2016 09:08     STUDIES:  Renal u/s 11/24>>> 1. No mass or hydronephrosis of the kidneys.  2. Bilateral pleural effusions and trace perihepatic ascites. 2D echo 11/22>>> EF 60-65%, med-sized vegetation on aortic valve with severe regurg, large vegetation tricuspid valve with mod TR, PA pressure  CULTURES: BC x 2 11/22>>> MSSA   ANTIBIOTICS: Nafcillin 11/23>>>  SIGNIFICANT EVENTS:   LINES/TUBES: ETT 11/26>>> L IJ CVL 11/26>>>  DISCUSSION: 45yo male with known IV drug abuse admitted 11/21 with cellulitis and MSSA bacteremia from multi-valve endocarditis now with respiratory failure requiring intubation.   ASSESSMENT / PLAN:  PULMONARY Acute respiratory failure - eptic emboli +/- volume overload +/- HCAP.  Did initially improve with diuresis.  P:   Vent support - 8cc/kg  F/u CXR  F/u ABG Consider CT chest once more stable  abx as above - ?need to broaden for HCAP coverage - hold off for now Sputum culture   CARDIOVASCULAR Infectious endocarditis  - MSSA, tricuspid and aortic valve  Hypotension - r/t sepsis and sedation needs  Severe Aortic regurg, Mod TR P:  TEE planned for Monday 11/27 See ID  Will have to place central line at this time with multiple gtts, hypotension  Cardiology, CVTS aware   RENAL AKI - multifactorial from sepsis and likely septic emboli  Metabolic acidosis  hypokalemia  P:   Monitor UOP  F/u chem  Replete K  PRN  GASTROINTESTINAL Hep C infection  Diarrhea - CDiff neg P:   NPO  TF  Plan to recheck for HIV, hepatitis B, hepatitis C per infectious disease recommendations  HEMATOLOGIC Anemia of chronic disease  P:  F/u CBC  SQ heparin   INFECTIOUS Infectious endocarditis  ?HCAP  BLE cellulitis - no evidence of abscess R foot CT P:   ID following  Nafcillin  6-8 weeks IV abx  Had to place CVL for hypotension, multiple gtts despite POS cultures still, but will need line holiday at some point CVTS following   ENDOCRINE No active issue P:   Monitor glucose on chem  NEUROLOGIC Sedation needs on vent -- VERY difficult to sedate  Polysubstance abuse, IVDA  P:   RASS goal: -2 Fentanyl, propofol, versed gtts  Wean propofol off as able  Daily WUA    FAMILY  - Updates:  Brother updated 11/26 by Dr. Rito EhrlichKrishnan and Theola Sequinn.   - Inter-disciplinary family meet or Palliative Care meeting due by:  12/3   [name], NP 10/26/2016  11:29 AM Pager: [phone] or [phone]  ATTENDING NOTE / ATTESTATION NOTE :   I have discussed the case with the resident/APP  [name] NP   I agree with the resident/APP's  history, physical examination, assessment, and plans.    I have edited the above note and modified it according to our agreed history, physical examination, assessment and plan.   Briefly, patient known to have IV drug use, with history of chronic hepatitis C, comes in with heroin overdose and cellulitis. Since that time, he has been diagnosed with MSSA infective endocarditis with vegetation in aortic and tricuspid valve, with severe aortic insufficiency. His blood cultures remained positive. He also has septic emboli in his lungs as well as Osler's nodes. Course complicated by fluid overload and renal failure. He was intubated today for respiratory failure.    I extensively discussed with the patient is overall condition and prognosis. He understands he is  critically ill because of infective endocarditis and the bacterial seeding. He said he wanted to be a full code and do everything.  Continue IV antibiotics per infectious disease. Continue ventilatory support. Diuresis has been since held. He got 2 L fluid bolus and remained hypotensive. Pressors have been started. We'll consult TCVS but less likely will have surgery given persistent bacteremia and patient continues to use IV drugs. Overall poor prognosis. Appreciate cardiology, nephrology, infectious disease services.  I have spent 30  minutes of critical care time with this patient today.  Family :No family at bedside.    Pollie MeyerJ. Angelo A de Dios, MD 10/26/2016, 7:18 PM [facility] Pager [phone] After 3 pm or if no answer, call [phone]

## 2016-10-26 NOTE — Progress Notes (Signed)
Assessment:  1 AKI prob immune complex mediated due to endocarditis or embolic--improving 2 Volume Overload, resolved 3 Acute bacterial endocarditis 4 Active IVDU 5 MSSA bacteremia 6 VDRF s/p intubation today prob due to septic pneumonia  Subjective: Interval History:Respiratory distress today with intubation  Objective: Vital signs in last 24 hours: Temp:  [97.9 F (36.6 C)-98.7 F (37.1 C)] 98.3 F (36.8 C) (11/26 1131) Pulse Rate:  [100-122] 122 (11/26 1200) Resp:  [18-20] 20 (11/26 1200) BP: (115-135)/(32-57) 115/57 (11/26 0700) SpO2:  [94 %-100 %] 100 % (11/26 1200) FiO2 (%):  [100 %] 100 % (11/26 1200) Weight:  [75.2 kg (165 lb 12.6 oz)] 75.2 kg (165 lb 12.6 oz) (11/25 2021) Weight change: -0.097 kg (-3.4 oz)  Intake/Output from previous day: 11/25 0701 - 11/26 0700 In: 1663 [P.O.:1660; I.V.:3] Out: 4253 [Urine:4250; Stool:3] Intake/Output this shift: Total I/O In: 100 [IV Piggyback:100] Out: -   General appearance: sedated and intubated Resp: coarse BS Cardio: regular rate and rhythm, S1, S2 normal, no murmur, click, rub or gallop Extremities: extremities normal, atraumatic, no cyanosis or edema  Feet bandaged bilateral  Lab Results:  Recent Labs  10/26/16 0735 10/26/16 1329  WBC 14.0* 11.4*  HGB 8.6* 7.3*  HCT 25.1* 21.5*  PLT 235 182   BMET:  Recent Labs  10/25/16 0513 10/26/16 0735  NA 136 134*  K 3.7 3.4*  CL 107 105  CO2 19* 18*  GLUCOSE 105* 129*  BUN 135* 120*  CREATININE 2.39* 2.03*  CALCIUM 7.3* 7.3*   No results for input(s): PTH in the last 72 hours. Iron Studies: No results for input(s): IRON, TIBC, TRANSFERRIN, FERRITIN in the last 72 hours. Studies/Results: Dg Chest Port 1 View  Result Date: 10/26/2016 CLINICAL DATA:  Dyspnea and left-sided chest pain EXAM: PORTABLE CHEST 1 VIEW COMPARISON:  10/24/2016 FINDINGS: Cardiac shadow is stable. Bilateral infiltrates are again identified and stable. No sizable effusion is seen. No  bony abnormality is noted. IMPRESSION: No change in bilateral infiltrates. Electronically Signed   By: Alcide CleverMark  Lukens M.D.   On: 10/26/2016 09:08    Scheduled: . aspirin  324 mg Oral NOW   Or  . aspirin  300 mg Rectal NOW  . famotidine (PEPCID) IV  20 mg Intravenous Q24H  . feeding supplement (PRO-STAT SUGAR FREE 64)  30 mL Per Tube BID  . feeding supplement (VITAL HIGH PROTEIN)  1,000 mL Per Tube Q24H  . fentaNYL      . heparin subcutaneous  5,000 Units Subcutaneous Q8H  . nafcillin IV  2 g Intravenous Q4H  . predniSONE  40 mg Oral Q breakfast  . propofol      . protein supplement shake  11 oz Oral BID BM  . sodium bicarbonate  650 mg Oral TID  . sodium chloride flush  3 mL Intravenous Q12H      LOS: 5 days   Shelbylynn Walczyk C 10/26/2016,1:57 PM

## 2016-10-26 NOTE — Progress Notes (Signed)
OT Cancellation Note  Patient Details Name: Joel Sheppard MRN: [id number] DOB: 05/21/1971   Cancelled Treatment:    Reason Eval/Treat Not Completed: Medical issues which prohibited therapy. Pt intubated and transferred to ICU.  Joel Sheppard, Joel Sheppard [phone] 10/26/2016, 2:35 PM

## 2016-10-26 NOTE — Procedures (Signed)
Central Venous Catheter Insertion Procedure Note Joel Sheppard [id number] 07/16/1971  Procedure: Insertion of Central Venous Catheter Indications: Assessment of intravascular volume, Drug and/or fluid administration and Frequent blood sampling  Procedure Details Consent: Risks of procedure as well as the alternatives and risks of each were explained to the (patient/caregiver).  Consent for procedure obtained. Time Out: Verified patient identification, verified procedure, site/side was marked, verified correct patient position, special equipment/implants available, medications/allergies/relevent history reviewed, required imaging and test results available.  Performed  Maximum sterile technique was used including antiseptics, cap, gloves, gown, hand hygiene, mask and sheet. Skin prep: Chlorhexidine; local anesthetic administered A antimicrobial bonded/coated triple lumen catheter was placed in the left internal jugular vein using the Seldinger technique.  Evaluation Blood flow good Complications: No apparent complications Patient did tolerate procedure well. Chest X-ray ordered to verify placement.  CXR: pending.   Performed using ultrasound guidance.  Wire visualized in vessel under ultrasound.   Joel DressKaty Whiteheart, NP 10/26/2016  1:52 PM Pager: [phone] or [phone]

## 2016-10-26 NOTE — Progress Notes (Signed)
bp in the 80's, cvp 7. Linus GalasKatie W, NP notified>ordered a liter ns bolus

## 2016-10-26 NOTE — Consult Note (Signed)
Reason for Consult: Endocarditis  Requesting Physician: Christene Slatese Dios  Cardiologist: New  HPI: This is a 45 y.o. 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  11/24 BCx still positive for MSSA. BCx from 11/25 and 11.26 have been submitted.  PMHx:  Past Medical History:  Diagnosis Date  . Hepatitis C    s/p treatment with Harvoni  . Heroin overdose 09/05/2016  . IV drug abuse    Past Surgical History:  Procedure Laterality Date  . LEG SURGERY      FAMHx: Family History  Problem Relation Age of Onset  . Leukemia Father   . Alzheimer's disease Mother     SOCHx:  reports that he has been smoking Cigarettes.  He has been smoking about 1.00 pack per day. He has never used smokeless tobacco. He reports that he uses drugs, including Cocaine, IV, and Heroin, about 7 times per week. He reports that he does not drink alcohol.  ALLERGIES: No Known Allergies  ROS: Review of systems not obtained due to patient factors.  HOME MEDICATIONS: Prescriptions Prior to Admission  Medication Sig Dispense Refill Last Dose  . ibuprofen (ADVIL,MOTRIN) 200 MG tablet Take 200-400 mg by mouth every 6 (six) hours as needed (for pain).   10/28/2016 at unknown time  . naproxen (NAPROSYN) 250 MG tablet Take 250 mg by mouth 3 (three) times daily as needed for moderate pain.   Past Week at Unknown time  . propranolol (INDERAL) 10 MG tablet Take 10 mg by mouth 3 (three) times daily as needed (anxiety).    over 1 month at unknown time  . QUEtiapine (SEROQUEL) 300 MG tablet Take 300 mg by mouth at bedtime.   over 1 month at unknown time    HOSPITAL MEDICATIONS: Scheduled: . aspirin  324 mg Oral NOW   Or  . aspirin  300 mg Rectal NOW  . famotidine  (PEPCID) IV  20 mg Intravenous Q24H  . feeding supplement (PRO-STAT SUGAR FREE 64)  30 mL Per Tube BID  . feeding supplement (VITAL HIGH PROTEIN)  1,000 mL Per Tube Q24H  . fentaNYL      . heparin subcutaneous  5,000 Units Subcutaneous Q8H  . nafcillin IV  2 g Intravenous Q4H  . predniSONE  40 mg Oral Q breakfast  . propofol      . protein supplement shake  11 oz Oral BID BM  . sodium bicarbonate  650 mg Oral TID  . sodium chloride flush  3 mL Intravenous Q12H   Continuous: . fentaNYL infusion INTRAVENOUS    . midazolam (VERSED) infusion    . propofol (DIPRIVAN) infusion      VITALS: Blood pressure (!) 92/36, pulse 94, temperature 98.3 F (36.8 C), temperature source Oral, resp. rate (!) 30, height 5\' 9"  (1.753 m), weight 165 lb 12.6 oz (75.2 kg), SpO2 100 %.  PHYSICAL EXAM:  General: Intubated, sedated Head: no evidence of trauma, PERRL, EOMI, no exophtalmos or lid lag, no myxedema, no xanthelasma; normal ears, nose and oropharynx Neck: elevated jugular venous pulsations and prompt hepatojugular reflux; hyperdynamic carotid pulses without delay and no carotid bruits Chest: clear to auscultation, no signs of consolidation by percussion or palpation, normal fremitus, symmetrical and full respiratory excursions Cardiovascular: normal position and hyperdynamic apical impulse,  regular rhythm, normal first heart sound and faint second heart sound, no rubs or gallops, 2/6 Ao ejection murmur, 3/6 decrescendo AI murmur throughout precordium Abdomen: no tenderness or distention, no masses by palpation, no abnormal pulsatility or arterial bruits, normal bowel sounds, no hepatosplenomegaly Extremities: no clubbing, cyanosis;  no edema; 2+ radial, ulnar and brachial pulses bilaterally; 2+ right femoral, posterior tibial and dorsalis pedis pulses; 2+ left femoral, posterior tibial and dorsalis pedis pulses; no subclavian or femoral bruits Neurological: sedated  All superficial arterial sites show  evidence of hyperdynamic pulses/wide pulse pressure consistent with severe AI.   LABS  CBC  Recent Labs  10/25/16 0513 10/26/16 0735 10/26/16 1329  WBC 10.5 14.0* 11.4*  NEUTROABS 8.9*  --   --   HGB 9.3* 8.6* 7.3*  HCT 26.5* 25.1* 21.5*  MCV 80.3 81.0 81.7  PLT 186 235 182   Basic Metabolic Panel  Recent Labs  10/25/16 0513 10/26/16 0735  NA 136 134*  K 3.7 3.4*  CL 107 105  CO2 19* 18*  GLUCOSE 105* 129*  BUN 135* 120*  CREATININE 2.39* 2.03*  CALCIUM 7.3* 7.3*   Liver Function Tests  Recent Labs  10/24/16 0349 10/25/16 0513  AST 65* 68*  ALT 41 39  ALKPHOS 106 110  BILITOT 1.9* 4.0*  PROT 5.0* 5.1*  ALBUMIN 1.2* 1.3*    IMAGING: Dg Chest Portable 1 View  Result Date: 10/26/2016 CLINICAL DATA:  Status post intubation and central line placement EXAM: PORTABLE CHEST 1 VIEW COMPARISON:  10/26/2016 FINDINGS: Cardiac shadow is stable. Diffuse bilateral infiltrates are again identified and stable. A left jugular central line is noted with the tip in the proximal superior vena cava. No pneumothorax is seen. Nasogastric catheter is now noted within the stomach. Endotracheal tube is noted at the level of the aortic knob in satisfactory position. IMPRESSION: No change in bilateral infiltrates. Tubes and lines as described without complicating factors. Electronically Signed   By: Alcide CleverMark  Lukens M.D.   On: 10/26/2016 13:57   Dg Chest Port 1 View  Result Date: 10/26/2016 CLINICAL DATA:  Dyspnea and left-sided chest pain EXAM: PORTABLE CHEST 1 VIEW COMPARISON:  10/24/2016 FINDINGS: Cardiac shadow is stable. Bilateral infiltrates are again identified and stable. No sizable effusion is seen. No bony abnormality is noted. IMPRESSION: No change in bilateral infiltrates. Electronically Signed   By: Alcide CleverMark  Lukens M.D.   On: 10/26/2016 09:08   Dg Abd Portable 1v  Result Date: 10/26/2016 CLINICAL DATA:  Check gastric catheter placement EXAM: PORTABLE ABDOMEN - 1 VIEW  COMPARISON:  None. FINDINGS: Scattered large and small bowel gas is noted. A nasogastric catheter is noted with the tip in the mid stomach. No other focal abnormality is seen. IMPRESSION: Gastric catheter in the mid stomach. Electronically Signed   By: Alcide CleverMark  Lukens M.D.   On: 10/26/2016 13:58    ECHO 10/22/16  - Left ventricle: The cavity size was normal. Wall thickness was   normal. Systolic function was normal. The estimated ejection   fraction was in the range of 60% to 65%. Wall motion was normal;   there were no regional wall motion abnormalities. - Aortic valve: There was a medium-sized vegetation on the left   ventricular aspect. There was severe regurgitation. Valve area   (VTI): 2.11 cm^2. Valve area (Vmax): 2.01 cm^2. Valve area   (Vmean): 1.92 cm^2. - Tricuspid valve: There was a large vegetation on the right atrial   aspect. There was mild-moderate regurgitation. - Pulmonary  arteries: Systolic pressure was mildly increased. PA   peak pressure: 39 mm Hg (S).  Impressions:  - Normal LV function   There is evidence of aortic valve endocarditis and tricuspid   valve endocarditis  ECG: STach, PVCs, no repol changes, No AV block, normal PR  TELEMETRY: STach  IMPRESSION: 1. Cardiogenic and septic shock 2. Severe acute aortic insufficiency 3. Acute respiratory failure 4. Acute pulmonary edema 5. Acute aortic and tricuspid valve endocarditis, MSSA 6. Acute renal failure   RECOMMENDATION: 1. Prognosis is grim. Without aortic valve replacement soon, he will probably not survive, but with ongoing bacteremia, the risk of seeding the prosthesis is very high, especially with large residual vegetation on the tricuspid valve that may prevent rapid clearing of the bloodstream infection. 2. Surgical consultation is recommended, but expect there will be reluctance to proceed with surgery due to the above consideration, as well as ongoing IV drug abuse. Consider cadaveric homograft  for AVR. Despite young age, I would avoid mechanical AVR. 3. Diuresis (limited by hypotension, renal failure). 4. TEE at bedside tomorrow to clarify presence of any other anatomical considerations such as perivalvular abscess.   Time Spent Directly with Patient: 45 minutes  Thurmon Fair, MD, [facility] [phone] office [phone] pager   10/26/2016, 2:02 PM

## 2016-10-26 NOTE — Progress Notes (Signed)
Hemoglobin of 7.3> Nash-Finch Companykatie Whiteheart notified.

## 2016-10-26 NOTE — Progress Notes (Signed)
[facility] PROGRESS NOTE  ALISTER STAVER XLK:[id number] DOB: 01-18-1971 DOA: 10/07/2016  PCP: Pcp Not In System  Brief History/Interval Summary: 45 year old Caucasian male with a past medical history of intravenous drug use, last use just prior to admission, hepatitis C status post treatment with her morning, recent hospitalization in October for heroin overdose presented with findings suggestive of cellulitis involving the left foot and right tibial tuberosity. He was initially treated with oral antibiotics with which he did not improve and actually had worsening foot pain. He was hospitalized for further management. Subsequently, he developed fluid overload. He was given Lasix with improvement.  Reason for Visit: Lower extremity cellulitis. Endocarditis.  Consultants: Infectious disease. Nephrology. Critical care medicine. Cardiology.  Procedures:  Transthoracic echocardiogram Study Conclusions - Left ventricle: The cavity size was normal. Wall thickness was   normal. Systolic function was normal. The estimated ejection   fraction was in the range of 60% to 65%. Wall motion was normal;   there were no regional wall motion abnormalities. - Aortic valve: There was a medium-sized vegetation on the left   ventricular aspect. There was severe regurgitation. Valve area   (VTI): 2.11 cm^2. Valve area (Vmax): 2.01 cm^2. Valve area   (Vmean): 1.92 cm^2. - Tricuspid valve: There was a large vegetation on the right atrial   aspect. There was mild-moderate regurgitation. - Pulmonary arteries: Systolic pressure was mildly increased. PA   peak pressure: 39 mm Hg (S). Impressions: - Normal LV function   There is evidence of aortic valve endocarditis and tricuspid   valve endocarditis   Antibiotics: Previously, he has been on vancomycin, Zosyn, cefepime Currently on nafcillin  Subjective/Interval History: Patient noted to be very tachypneic this morning. He states that he is  feeling short of breath. He denies any chest pain. He appears to be somewhat distracted. Has not had any nausea, vomiting. States that his stools are becoming more formed. His brother is at the bedside.   ROS: Denies any nausea or vomiting  Objective:  Vital Signs  Vitals:   10/25/16 2021 10/26/16 0423 10/26/16 0700 10/26/16 1131  BP: (!) 120/46 (!) 121/32 (!) 115/57   Pulse: (!) 116 (!) 119 (!) 106   Resp: 18 18 18    Temp: 97.9 F (36.6 C) 98.3 F (36.8 C) 98.4 F (36.9 C) 98.3 F (36.8 C)  TempSrc: Oral Oral Oral Oral  SpO2: 96% 94% 95%   Weight: 75.2 kg (165 lb 12.6 oz)     Height:        Intake/Output Summary (Last 24 hours) at 10/26/16 1210 Last data filed at 10/26/16 3664  Gross per 24 hour  Intake             1400 ml  Output             2350 ml  Net             -950 ml   Filed Weights   10/23/16 2047 10/25/16 0500 10/25/16 2021  Weight: 75.4 kg (166 lb 3.6 oz) 75.3 kg (166 lb) 75.2 kg (165 lb 12.6 oz)    General appearance: Awake, alert, although distracted. Somewhat sleepy.  Resp: Air entry appears to have improved some, although remains diminished at the bases with a few crackles. But he is noted to be more tachypneic this morning and using accessory muscles. Cardio: regular rate and rhythm, S1, S2 normal, no murmur, click, rub or gallop GI: soft. Mildly tenderness diffusely without any rebound, rigidity or  guarding. Extremities: Bilateral edema in the lower extremities Neurologic: Awake, alert. Oriented 3. No obvious focal neurological deficits.  Lab Results:  Data Reviewed: I have personally reviewed following labs and imaging studies  CBC:  Recent Labs Lab 10/08/2016 2056 10/22/16 0527 10/23/16 0507 10/24/16 0349 10/25/16 0513 10/26/16 0735  WBC 16.4* 15.7* 8.2 9.3 10.5 14.0*  NEUTROABS 14.1*  --   --   --  8.9*  --   HGB 10.8* 10.3* 9.0* 8.8* 9.3* 8.6*  HCT 30.9* 29.1* 25.6* 25.4* 26.5* 25.1*  MCV 80.5 80.2 79.0 80.4 80.3 81.0  PLT 132* 100*  91* 126* 186 235    Basic Metabolic Panel:  Recent Labs Lab 10/22/16 0527 10/23/16 0507 10/24/16 0349 10/25/16 0513 10/26/16 0735  NA 122* 125* 130* 136 134*  K 5.4* 5.6* 5.2* 3.7 3.4*  CL 92* 99* 105 107 105  CO2 19* 15* 14* 19* 18*  GLUCOSE 84 139* 112* 105* 129*  BUN 143* 153* 156* 135* 120*  CREATININE 4.31* 4.12* 3.08* 2.39* 2.03*  CALCIUM 7.5* 7.4* 7.4* 7.3* 7.3*  MG 2.8*  --   --   --   --   PHOS 9.0*  --   --   --   --     GFR: Estimated Creatinine Clearance: 46 mL/min (by C-G formula based on SCr of 2.03 mg/dL (H)).  Liver Function Tests:  Recent Labs Lab 10/04/2016 2056 10/22/16 0527 10/24/16 0349 10/25/16 0513  AST 91* 61* 65* 68*  ALT 83* 60 41 39  ALKPHOS 162* 134* 106 110  BILITOT 2.8* 3.0* 1.9* 4.0*  PROT 6.4* 5.5* 5.0* 5.1*  ALBUMIN 1.8* 1.6* 1.2* 1.3*    Coagulation Profile:  Recent Labs Lab 10/22/16 0527  INR 1.33    Cardiac Enzymes:  Recent Labs Lab 10/22/16 0022  CKTOTAL 219     Recent Results (from the past 240 hour(s))  Blood culture (routine x 2)     Status: Abnormal   Collection Time: 10/22/16 12:01 AM  Result Value Ref Range Status   Specimen Description BLOOD RIGHT ARM  Final   Special Requests BOTTLES DRAWN AEROBIC AND ANAEROBIC  Final   Culture  Setup Time   Final    GRAM POSITIVE COCCI IN CLUSTERS IN BOTH AEROBIC AND ANAEROBIC BOTTLES CRITICAL VALUE NOTED.  VALUE IS CONSISTENT WITH PREVIOUSLY REPORTED AND CALLED VALUE. Performed at [facility]    Culture STAPHYLOCOCCUS AUREUS (A)  Final   Report Status 10/25/2016 FINAL  Final   Organism ID, Bacteria STAPHYLOCOCCUS AUREUS  Final      Susceptibility   Staphylococcus aureus - MIC*    CIPROFLOXACIN <=0.5 SENSITIVE Sensitive     ERYTHROMYCIN <=0.25 SENSITIVE Sensitive     GENTAMICIN <=0.5 SENSITIVE Sensitive     OXACILLIN <=0.25 SENSITIVE Sensitive     TETRACYCLINE <=1 SENSITIVE Sensitive     VANCOMYCIN 1 SENSITIVE Sensitive     TRIMETH/SULFA <=10  SENSITIVE Sensitive     CLINDAMYCIN <=0.25 SENSITIVE Sensitive     RIFAMPIN <=0.5 SENSITIVE Sensitive     Inducible Clindamycin NEGATIVE Sensitive     * STAPHYLOCOCCUS AUREUS  Blood culture (routine x 2)     Status: Abnormal   Collection Time: 10/22/16 12:20 AM  Result Value Ref Range Status   Specimen Description BLOOD LEFT ANTECUBITAL  Final   Special Requests BOTTLES DRAWN AEROBIC AND ANAEROBIC  Final   Culture  Setup Time   Final    GRAM POSITIVE COCCI IN CLUSTERS IN  BOTH AEROBIC AND ANAEROBIC BOTTLES CRITICAL RESULT CALLED TO, READ BACK BY AND VERIFIED WITH: Erling Cruz [facility] 2017 10/22/16 A BROWNING    Culture (A)  Final    STAPHYLOCOCCUS AUREUS SUSCEPTIBILITIES PERFORMED ON PREVIOUS CULTURE WITHIN THE LAST 5 DAYS. Performed at [facility]    Report Status 10/26/2016 FINAL  Final  Blood Culture ID Panel (Reflexed)     Status: Abnormal   Collection Time: 10/22/16 12:20 AM  Result Value Ref Range Status   Enterococcus species NOT DETECTED NOT DETECTED Final   Listeria monocytogenes NOT DETECTED NOT DETECTED Final   Staphylococcus species DETECTED (A) NOT DETECTED Final    Comment: CRITICAL RESULT CALLED TO, READ BACK BY AND VERIFIED WITH: E JACKSON PHARMD 2017 10/22/16 A BROWNING    Staphylococcus aureus DETECTED (A) NOT DETECTED Final    Comment: CRITICAL RESULT CALLED TO, READ BACK BY AND VERIFIED WITH: Erling Cruz [facility] 2017 10/22/16 A BROWNING    Methicillin resistance NOT DETECTED NOT DETECTED Final   Streptococcus species NOT DETECTED NOT DETECTED Final   Streptococcus agalactiae NOT DETECTED NOT DETECTED Final   Streptococcus pneumoniae NOT DETECTED NOT DETECTED Final   Streptococcus pyogenes NOT DETECTED NOT DETECTED Final   Acinetobacter baumannii NOT DETECTED NOT DETECTED Final   Enterobacteriaceae species NOT DETECTED NOT DETECTED Final   Enterobacter cloacae complex NOT DETECTED NOT DETECTED Final   Escherichia coli NOT DETECTED NOT DETECTED Final     Klebsiella oxytoca NOT DETECTED NOT DETECTED Final   Klebsiella pneumoniae NOT DETECTED NOT DETECTED Final   Proteus species NOT DETECTED NOT DETECTED Final   Serratia marcescens NOT DETECTED NOT DETECTED Final   Haemophilus influenzae NOT DETECTED NOT DETECTED Final   Neisseria meningitidis NOT DETECTED NOT DETECTED Final   Pseudomonas aeruginosa NOT DETECTED NOT DETECTED Final   Candida albicans NOT DETECTED NOT DETECTED Final   Candida glabrata NOT DETECTED NOT DETECTED Final   Candida krusei NOT DETECTED NOT DETECTED Final   Candida parapsilosis NOT DETECTED NOT DETECTED Final   Candida tropicalis NOT DETECTED NOT DETECTED Final    Comment: Performed at [facility]  Culture, blood (Routine X 2) w Reflex to ID Panel     Status: None (Preliminary result)   Collection Time: 10/22/16 10:59 PM  Result Value Ref Range Status   Specimen Description BLOOD LEFT ANTECUBITAL  Final   Special Requests BOTTLES DRAWN AEROBIC AND ANAEROBIC 10 CC  Final   Culture   Final    NO GROWTH 2 DAYS Performed at [facility]    Report Status PENDING  Incomplete  Culture, Urine     Status: Abnormal   Collection Time: 10/22/16 10:59 PM  Result Value Ref Range Status   Specimen Description URINE, CLEAN CATCH  Final   Special Requests NONE  Final   Culture (A)  Final    70,000 COLONIES/mL YEAST Performed at [facility]    Report Status 10/24/2016 FINAL  Final  Culture, blood (Routine X 2) w Reflex to ID Panel     Status: None (Preliminary result)   Collection Time: 10/24/16  7:31 PM  Result Value Ref Range Status   Specimen Description BLOOD LEFT ANTECUBITAL  Final   Special Requests BOTTLES DRAWN AEROBIC AND ANAEROBIC 10 CC  Final   Culture NO GROWTH < 24 HOURS  Final   Report Status PENDING  Incomplete  Culture, blood (Routine X 2) w Reflex to ID Panel     Status: Abnormal   Collection  Time: 10/24/16  7:36 PM  Result Value Ref Range Status   Specimen Description BLOOD  LEFT HAND  Final   Special Requests IN PEDIATRIC BOTTLE 4CC  Final   Culture  Setup Time   Final    GRAM POSITIVE COCCI IN CLUSTERS IN PEDIATRIC BOTTLE CRITICAL RESULT CALLED TO, READ BACK BY AND VERIFIED WITH: C BALL,PHARMD AT 1448 10/25/16 BY L BENFIELD    Culture (A)  Final    STAPHYLOCOCCUS AUREUS SUSCEPTIBILITIES PERFORMED ON PREVIOUS CULTURE WITHIN THE LAST 5 DAYS.    Report Status 10/26/2016 FINAL  Final  Blood Culture ID Panel (Reflexed)     Status: Abnormal   Collection Time: 10/24/16  7:36 PM  Result Value Ref Range Status   Enterococcus species NOT DETECTED NOT DETECTED Final   Listeria monocytogenes NOT DETECTED NOT DETECTED Final   Staphylococcus species DETECTED (A) NOT DETECTED Final    Comment: CRITICAL RESULT CALLED TO, READ BACK BY AND VERIFIED WITH: C BALL,PHARMD AT 1448 10/25/16 BY L BENFIELD    Staphylococcus aureus DETECTED (A) NOT DETECTED Final    Comment: CRITICAL RESULT CALLED TO, READ BACK BY AND VERIFIED WITH: C BALL,PHARMD AT 1448 10/25/16 BY L BENFIELD    Methicillin resistance NOT DETECTED NOT DETECTED Final   Streptococcus species NOT DETECTED NOT DETECTED Final   Streptococcus agalactiae NOT DETECTED NOT DETECTED Final   Streptococcus pneumoniae NOT DETECTED NOT DETECTED Final   Streptococcus pyogenes NOT DETECTED NOT DETECTED Final   Acinetobacter baumannii NOT DETECTED NOT DETECTED Final   Enterobacteriaceae species NOT DETECTED NOT DETECTED Final   Enterobacter cloacae complex NOT DETECTED NOT DETECTED Final   Escherichia coli NOT DETECTED NOT DETECTED Final   Klebsiella oxytoca NOT DETECTED NOT DETECTED Final   Klebsiella pneumoniae NOT DETECTED NOT DETECTED Final   Proteus species NOT DETECTED NOT DETECTED Final   Serratia marcescens NOT DETECTED NOT DETECTED Final   Haemophilus influenzae NOT DETECTED NOT DETECTED Final   Neisseria meningitidis NOT DETECTED NOT DETECTED Final   Pseudomonas aeruginosa NOT DETECTED NOT DETECTED Final    Candida albicans NOT DETECTED NOT DETECTED Final   Candida glabrata NOT DETECTED NOT DETECTED Final   Candida krusei NOT DETECTED NOT DETECTED Final   Candida parapsilosis NOT DETECTED NOT DETECTED Final   Candida tropicalis NOT DETECTED NOT DETECTED Final  C difficile quick scan w PCR reflex     Status: None   Collection Time: 10/25/16  8:20 AM  Result Value Ref Range Status   C Diff antigen NEGATIVE NEGATIVE Final   C Diff toxin NEGATIVE NEGATIVE Final   C Diff interpretation No C. difficile detected.  Final  Culture, blood (Routine X 2) w Reflex to ID Panel     Status: None (Preliminary result)   Collection Time: 10/25/16  2:00 PM  Result Value Ref Range Status   Specimen Description BLOOD BLOOD LEFT ARM  Final   Special Requests BOTTLES DRAWN AEROBIC AND ANAEROBIC 10CC  Final   Culture NO GROWTH <12 HOURS  Final   Report Status PENDING  Incomplete  Culture, blood (Routine X 2) w Reflex to ID Panel     Status: None (Preliminary result)   Collection Time: 10/25/16  2:15 PM  Result Value Ref Range Status   Specimen Description BLOOD BLOOD LEFT HAND  Final   Special Requests BOTTLES DRAWN AEROBIC AND ANAEROBIC 5CC  Final   Culture NO GROWTH <12 HOURS  Final   Report Status PENDING  Incomplete  Radiology Studies: Dg Chest Port 1 View  Result Date: 10/26/2016 CLINICAL DATA:  Dyspnea and left-sided chest pain EXAM: PORTABLE CHEST 1 VIEW COMPARISON:  10/24/2016 FINDINGS: Cardiac shadow is stable. Bilateral infiltrates are again identified and stable. No sizable effusion is seen. No bony abnormality is noted. IMPRESSION: No change in bilateral infiltrates. Electronically Signed   By: Alcide Clever M.D.   On: 10/26/2016 09:08     Medications:  Scheduled: . aspirin  324 mg Oral NOW   Or  . aspirin  300 mg Rectal NOW  . famotidine (PEPCID) IV  20 mg Intravenous Q24H  . fentaNYL (SUBLIMAZE) injection  50 mcg Intravenous Once  . nafcillin IV  2 g Intravenous Q4H  . nicotine  14  mg Transdermal Daily  . predniSONE  40 mg Oral Q breakfast  . protein supplement shake  11 oz Oral BID BM  . sodium bicarbonate  650 mg Oral TID  . sodium chloride flush  3 mL Intravenous Q12H   Continuous: . fentaNYL infusion INTRAVENOUS     ZOX:WRUEAV chloride, acetaminophen **OR** acetaminophen, fentaNYL, midazolam, midazolam, oxyCODONE, sodium chloride  Assessment/Plan:  Principal Problem:   Cellulitis Active Problems:   Hyperkalemia   Elevated liver enzymes   ARF (acute renal failure) (HCC)   Hepatitis C antibody test positive   Tobacco abuse   Hyponatremia   Hyperglycemia   Hypoalbuminemia due to protein-calorie malnutrition (HCC)   Normocytic anemia   Thrombocytopenia (HCC)   IVDU (intravenous drug user)   History of hepatitis C   Staphylococcus aureus bacteremia with sepsis (HCC)   Endocarditis of tricuspid valve   Aortic valve endocarditis   Severe aortic regurgitation   Severe tricuspid regurgitation   Osler's node   Septic embolism (HCC)   Hypoxemia   Bacteremia     Infectious Endocarditis -Blood cultures thus far notable for MSSA -2-D echocardiogram has evidence of aortic and tricuspid valve endocarditis -Patient initially continued on Vanco and Zosyn. Infectious disease changed him to nafcillin for improved CNS penetration in case patient has emboli to the brain -Patient to have TEE. Unfortunately, it will have to wait till Monday, 11/27. Heart rate is stable. No evidence for any AV blocks. -Anticipate patient will require 6-8 weeks of IV antibiotics. Per infectious disease, would not recommend PICC line at this time until bacteremia has cleared -Cardiothoracic surgeon, consulted by infectious disease. Repeat cultures also positive for bacteremia. Patient noted to be tachycardic this morning. Telemetry shows sinus tachycardia with multiple PVCs. Cardiology consulted to assist with management.  Acute respiratory failure with hypoxia Patient's chest x-ray  on 11/24 showed bilateral opacities concerning for fluid overload versus infection. Patient was given intravenous Lasix with good response. He has diuresed well. His symptoms were felt to be improving. However, patient noted to be more dyspneic today. Chest x-ray repeated and shows persistent bilateral opacity. It appears at this time that this is most likely an infectious process, possibly from septic embolization from his endocarditis. Patient needs to be monitored closely and may even need mechanical ventilation. Critical care medicine has been consulted. Patient to be moved to the intensive care unit. His brother has been notified.   Acute renal failure (ARF) with metabolic acidosis and hyperkalemia -Patient was initially given IV fluids. As mentioned above, he developed fluid overload. Patient was given Lasix with good response. His BUN and creatinine has improved. He has good urine output. Continue Foley catheter due to need for critical monitoring. Also on oral bicarbonate. Renal ultrasound does  not reveal any hydronephrosis. Nephrology is following.  Acute diarrhea C. difficile was negative. Diarrhea appears to be subsiding. Could be secondary to antibiotics.   Polysubstance abuse -Patient reports prior drug rehabilitation programs and reportedly had been clean for over 7 years at one point. -Patient claims he is now motivated to quit for good following the events of this hospital admission.  Cellulitis -Bilateral foot lesions noted, surrounding areas of erythema have improved -Patient was initially continued on empiric vancomycin and Zosyn. Now on nafcillin -Patient remains afebrile, leukocytosis has resolved, currently 8.2 -CT scan of right foot obtained. No evidence of abscess. Findings of soft tissue edema noted. -See below. Discussed case with infectious disease who has made changes to antibiotic regimen.  Hyperkalemia -Suspect secondary to presenting acute renal failure -Potassium  is improved with Kayexalate  Elevated liver enzymes/hepatitis C antibody test positive -Known prior history of hepatitis C status post treatment -Last viral load noted to be undetectable, last checked in October 2017 -Plan to recheck for HIV, hepatitis B, hepatitis C per infectious disease recommendations  Tobacco abuse -Seems stable. Continue nicotine patch for now  Hyponatremia -Initial concerns for hypovolemic hyponatremia. Patient was given IV fluids. -Sodium has improved some. Continue to monitor.   Hyperglycemia This has been mild. Will need outpatient monitoring.  Hypoalbuminemia due to protein-calorie malnutrition -Dietitian consulted -Appears stable this time  Normocytic anemia -Suspect secondary to anemia of chronic disease -There are no signs of acute blood loss -Hemoglobin seems to be stable. There is no evidence for overt bleeding. Some of this could be dilutional. -Continue to monitor.  Thrombocytopenia -Appears to have resolved. 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 Patient reports multiple joint pains including fingers back and knees and hips associated with joint swelling -Given acute renal failure, patient will not tolerate NSAID -Patient was given trial of prednisone. Patient has reported some improvement. Unclear if there is a role to continue steroids at this time.  DVT Prophylaxis: SCDs    Code Status: Full code  Family Communication: Discussed with the patient and his brother in detail Disposition Plan: Transfer to intensive care unit. Critical care medicine to assume care.    LOS: 5 days   Roy A Himelfarb Surgery CenterKRISHNAN,Alyria Krack  [facility] Pager [phone] 10/26/2016, 12:10 PM  If 7PM-7AM, please contact night-coverage at [email or web address], password [email or web address]

## 2016-10-26 NOTE — Progress Notes (Signed)
Subjective:  Still having difficulty breathing. He is having an EKG done prior to transport to the ICU  Antibiotics:  Anti-infectives    Start     Dose/Rate Route Frequency Ordered Stop   10/23/16 1700  nafcillin injection 2 g  Status:  Discontinued     2 g Intravenous Every 4 hours 10/23/16 1617 10/23/16 1655   10/23/16 1700  nafcillin 2 g in dextrose 5 % 100 mL IVPB     2 g 200 mL/hr over 30 Minutes Intravenous Every 4 hours 10/23/16 1655     10/22/16 2359  vancomycin (VANCOCIN) IVPB 750 mg/150 ml premix  Status:  Discontinued     750 mg 150 mL/hr over 60 Minutes Intravenous Every 24 hours 10/22/16 0336 10/22/16 2045   10/22/16 2200  ceFAZolin (ANCEF) IVPB 2g/100 mL premix  Status:  Discontinued     2 g 200 mL/hr over 30 Minutes Intravenous Every 12 hours 10/22/16 2105 10/23/16 1617   10/22/16 0800  piperacillin-tazobactam (ZOSYN) IVPB 2.25 g  Status:  Discontinued     2.25 g 100 mL/hr over 30 Minutes Intravenous Every 6 hours 10/22/16 0336 10/22/16 0505   10/22/16 0800  piperacillin-tazobactam (ZOSYN) IVPB 3.375 g  Status:  Discontinued     3.375 g 12.5 mL/hr over 240 Minutes Intravenous Every 8 hours 10/22/16 0505 10/22/16 2045   10/22/16 0315  piperacillin-tazobactam (ZOSYN) IVPB 3.375 g  Status:  Discontinued     3.375 g 100 mL/hr over 30 Minutes Intravenous  Once 10/22/16 0302 10/22/16 0304   10/22/16 0315  vancomycin (VANCOCIN) IVPB 1000 mg/200 mL premix  Status:  Discontinued     1,000 mg 200 mL/hr over 60 Minutes Intravenous  Once 10/22/16 0302 10/22/16 0304   10/16/2016 2330  piperacillin-tazobactam (ZOSYN) IVPB 3.375 g     3.375 g 12.5 mL/hr over 240 Minutes Intravenous  Once 10/07/2016 2329 10/22/16 0444   10/26/2016 2330  vancomycin (VANCOCIN) IVPB 1000 mg/200 mL premix     1,000 mg 200 mL/hr over 60 Minutes Intravenous  Once 10/24/2016 2329 10/22/16 0148      Medications: Scheduled Meds: . aspirin  324 mg Oral NOW   Or  . aspirin  300 mg Rectal NOW  .  famotidine (PEPCID) IV  20 mg Intravenous Q24H  . fentaNYL      . nafcillin IV  2 g Intravenous Q4H  . nicotine  14 mg Transdermal Daily  . predniSONE  40 mg Oral Q breakfast  . propofol      . protein supplement shake  11 oz Oral BID BM  . sodium bicarbonate  650 mg Oral TID  . sodium chloride flush  3 mL Intravenous Q12H   Continuous Infusions: . fentaNYL infusion INTRAVENOUS     PRN Meds:.sodium chloride, acetaminophen **OR** acetaminophen, fentaNYL, midazolam, midazolam, oxyCODONE, sodium chloride    Objective: Weight change: -3.4 oz (-0.097 kg)  Intake/Output Summary (Last 24 hours) at 10/26/16 1241 Last data filed at 10/26/16 1191  Gross per 24 hour  Intake             1400 ml  Output             2350 ml  Net             -950 ml   Blood pressure (!) 115/57, pulse (!) 122, temperature 98.3 F (36.8 C), temperature source Oral, resp. rate 20, height 5\' 9"  (1.753 m), weight 165 lb 12.6 oz (  75.2 kg), SpO2 100 %. Temp:  [97.9 F (36.6 C)-98.7 F (37.1 C)] 98.3 F (36.8 C) (11/26 1131) Pulse Rate:  [100-122] 122 (11/26 1200) Resp:  [18-20] 20 (11/26 1200) BP: (115-135)/(32-57) 115/57 (11/26 0700) SpO2:  [94 %-100 %] 100 % (11/26 1200) FiO2 (%):  [100 %] 100 % (11/26 1200) Weight:  [165 lb 12.6 oz (75.2 kg)] 165 lb 12.6 oz (75.2 kg) (11/25 2021)  Physical Exam: General: Alert and awake, oriented x3,Using accessory muscles   KG being performed  He has some exfoliation around one of his Osler's node see pictures below    Right foot 10/23/2016:       10/24/16:      10/26/16:      Left foot 10/23/2016:        10/24/16:      Right foot 10/23/2016:      Left foot 10/23/2016:        left foot 10/26/16:      Neuro: nonfocal, strength and sensation intact   CBC Latest Ref Rng & Units 10/26/2016 10/25/2016 10/24/2016  WBC 4.0 - 10.5 K/uL 14.0(H) 10.5 9.3  Hemoglobin 13.0 - 17.0 g/dL 1.6(X8.6(L) 0.9(U9.3(L)  0.4(V8.8(L)  Hematocrit 39.0 - 52.0 % 25.1(L) 26.5(L) 25.4(L)  Platelets 150 - 400 K/uL 235 186 126(L)      BMET  Recent Labs  10/25/16 0513 10/26/16 0735  NA 136 134*  K 3.7 3.4*  CL 107 105  CO2 19* 18*  GLUCOSE 105* 129*  BUN 135* 120*  CREATININE 2.39* 2.03*  CALCIUM 7.3* 7.3*     Liver Panel   Recent Labs  10/24/16 0349 10/25/16 0513  PROT 5.0* 5.1*  ALBUMIN 1.2* 1.3*  AST 65* 68*  ALT 41 39  ALKPHOS 106 110  BILITOT 1.9* 4.0*       Sedimentation Rate No results for input(s): ESRSEDRATE in the last 72 hours. C-Reactive Protein No results for input(s): CRP in the last 72 hours.  Micro Results: Recent Results (from the past 720 hour(s))  Blood culture (routine x 2)     Status: Abnormal   Collection Time: 10/22/16 12:01 AM  Result Value Ref Range Status   Specimen Description BLOOD RIGHT ARM  Final   Special Requests BOTTLES DRAWN AEROBIC AND ANAEROBIC 5ML  Final   Culture  Setup Time   Final    GRAM POSITIVE COCCI IN CLUSTERS IN BOTH AEROBIC AND ANAEROBIC BOTTLES CRITICAL VALUE NOTED.  VALUE IS CONSISTENT WITH PREVIOUSLY REPORTED AND CALLED VALUE. Performed at [facility]    Culture STAPHYLOCOCCUS AUREUS (A)  Final   Report Status 10/25/2016 FINAL  Final   Organism ID, Bacteria STAPHYLOCOCCUS AUREUS  Final      Susceptibility   Staphylococcus aureus - MIC*    CIPROFLOXACIN <=0.5 SENSITIVE Sensitive     ERYTHROMYCIN <=0.25 SENSITIVE Sensitive     GENTAMICIN <=0.5 SENSITIVE Sensitive     OXACILLIN <=0.25 SENSITIVE Sensitive     TETRACYCLINE <=1 SENSITIVE Sensitive     VANCOMYCIN 1 SENSITIVE Sensitive     TRIMETH/SULFA <=10 SENSITIVE Sensitive     CLINDAMYCIN <=0.25 SENSITIVE Sensitive     RIFAMPIN <=0.5 SENSITIVE Sensitive     Inducible Clindamycin NEGATIVE Sensitive     * STAPHYLOCOCCUS AUREUS  Blood culture (routine x 2)     Status: Abnormal   Collection Time: 10/22/16 12:20 AM  Result Value Ref Range Status   Specimen Description  BLOOD LEFT ANTECUBITAL  Final   Special Requests BOTTLES DRAWN AEROBIC  AND ANAEROBIC  Final   Culture  Setup Time   Final    GRAM POSITIVE COCCI IN CLUSTERS IN BOTH AEROBIC AND ANAEROBIC BOTTLES CRITICAL RESULT CALLED TO, READ BACK BY AND VERIFIED WITH: Erling Cruz [facility] 2017 10/22/16 A BROWNING    Culture (A)  Final    STAPHYLOCOCCUS AUREUS SUSCEPTIBILITIES PERFORMED ON PREVIOUS CULTURE WITHIN THE LAST 5 DAYS. Performed at [facility]    Report Status 10/26/2016 FINAL  Final  Blood Culture ID Panel (Reflexed)     Status: Abnormal   Collection Time: 10/22/16 12:20 AM  Result Value Ref Range Status   Enterococcus species NOT DETECTED NOT DETECTED Final   Listeria monocytogenes NOT DETECTED NOT DETECTED Final   Staphylococcus species DETECTED (A) NOT DETECTED Final    Comment: CRITICAL RESULT CALLED TO, READ BACK BY AND VERIFIED WITH: E JACKSON PHARMD 2017 10/22/16 A BROWNING    Staphylococcus aureus DETECTED (A) NOT DETECTED Final    Comment: CRITICAL RESULT CALLED TO, READ BACK BY AND VERIFIED WITH: Erling Cruz [employer] 2017 10/22/16 A BROWNING    Methicillin resistance NOT DETECTED NOT DETECTED Final   Streptococcus species NOT DETECTED NOT DETECTED Final   Streptococcus agalactiae NOT DETECTED NOT DETECTED Final   Streptococcus pneumoniae NOT DETECTED NOT DETECTED Final   Streptococcus pyogenes NOT DETECTED NOT DETECTED Final   Acinetobacter baumannii NOT DETECTED NOT DETECTED Final   Enterobacteriaceae species NOT DETECTED NOT DETECTED Final   Enterobacter cloacae complex NOT DETECTED NOT DETECTED Final   Escherichia coli NOT DETECTED NOT DETECTED Final   Klebsiella oxytoca NOT DETECTED NOT DETECTED Final   Klebsiella pneumoniae NOT DETECTED NOT DETECTED Final   Proteus species NOT DETECTED NOT DETECTED Final   Serratia marcescens NOT DETECTED NOT DETECTED Final   Haemophilus influenzae NOT DETECTED NOT DETECTED Final   Neisseria meningitidis NOT DETECTED NOT  DETECTED Final   Pseudomonas aeruginosa NOT DETECTED NOT DETECTED Final   Candida albicans NOT DETECTED NOT DETECTED Final   Candida glabrata NOT DETECTED NOT DETECTED Final   Candida krusei NOT DETECTED NOT DETECTED Final   Candida parapsilosis NOT DETECTED NOT DETECTED Final   Candida tropicalis NOT DETECTED NOT DETECTED Final    Comment: Performed at [facility]  Culture, blood (Routine X 2) w Reflex to ID Panel     Status: None (Preliminary result)   Collection Time: 10/22/16 10:59 PM  Result Value Ref Range Status   Specimen Description BLOOD LEFT ANTECUBITAL  Final   Special Requests BOTTLES DRAWN AEROBIC AND ANAEROBIC 10 CC  Final   Culture   Final    NO GROWTH 3 DAYS Performed at [facility]    Report Status PENDING  Incomplete  Culture, Urine     Status: Abnormal   Collection Time: 10/22/16 10:59 PM  Result Value Ref Range Status   Specimen Description URINE, CLEAN CATCH  Final   Special Requests NONE  Final   Culture (A)  Final    70,000 COLONIES/mL YEAST Performed at [facility]    Report Status 10/24/2016 FINAL  Final  Culture, blood (Routine X 2) w Reflex to ID Panel     Status: None (Preliminary result)   Collection Time: 10/24/16  7:31 PM  Result Value Ref Range Status   Specimen Description BLOOD LEFT ANTECUBITAL  Final   Special Requests BOTTLES DRAWN AEROBIC AND ANAEROBIC 10 CC  Final   Culture NO GROWTH 2 DAYS  Final   Report Status PENDING  Incomplete  Culture, blood (Routine X 2) w Reflex to ID Panel     Status: Abnormal   Collection Time: 10/24/16  7:36 PM  Result Value Ref Range Status   Specimen Description BLOOD LEFT HAND  Final   Special Requests IN PEDIATRIC BOTTLE 4CC  Final   Culture  Setup Time   Final    GRAM POSITIVE COCCI IN CLUSTERS IN PEDIATRIC BOTTLE CRITICAL RESULT CALLED TO, READ BACK BY AND VERIFIED WITH: C BALL,PHARMD AT 1448 10/25/16 BY L BENFIELD    Culture (A)  Final    STAPHYLOCOCCUS  AUREUS SUSCEPTIBILITIES PERFORMED ON PREVIOUS CULTURE WITHIN THE LAST 5 DAYS.    Report Status 10/26/2016 FINAL  Final  Blood Culture ID Panel (Reflexed)     Status: Abnormal   Collection Time: 10/24/16  7:36 PM  Result Value Ref Range Status   Enterococcus species NOT DETECTED NOT DETECTED Final   Listeria monocytogenes NOT DETECTED NOT DETECTED Final   Staphylococcus species DETECTED (A) NOT DETECTED Final    Comment: CRITICAL RESULT CALLED TO, READ BACK BY AND VERIFIED WITH: C BALL,PHARMD AT 1448 10/25/16 BY L BENFIELD    Staphylococcus aureus DETECTED (A) NOT DETECTED Final    Comment: CRITICAL RESULT CALLED TO, READ BACK BY AND VERIFIED WITH: C BALL,PHARMD AT 1448 10/25/16 BY L BENFIELD    Methicillin resistance NOT DETECTED NOT DETECTED Final   Streptococcus species NOT DETECTED NOT DETECTED Final   Streptococcus agalactiae NOT DETECTED NOT DETECTED Final   Streptococcus pneumoniae NOT DETECTED NOT DETECTED Final   Streptococcus pyogenes NOT DETECTED NOT DETECTED Final   Acinetobacter baumannii NOT DETECTED NOT DETECTED Final   Enterobacteriaceae species NOT DETECTED NOT DETECTED Final   Enterobacter cloacae complex NOT DETECTED NOT DETECTED Final   Escherichia coli NOT DETECTED NOT DETECTED Final   Klebsiella oxytoca NOT DETECTED NOT DETECTED Final   Klebsiella pneumoniae NOT DETECTED NOT DETECTED Final   Proteus species NOT DETECTED NOT DETECTED Final   Serratia marcescens NOT DETECTED NOT DETECTED Final   Haemophilus influenzae NOT DETECTED NOT DETECTED Final   Neisseria meningitidis NOT DETECTED NOT DETECTED Final   Pseudomonas aeruginosa NOT DETECTED NOT DETECTED Final   Candida albicans NOT DETECTED NOT DETECTED Final   Candida glabrata NOT DETECTED NOT DETECTED Final   Candida krusei NOT DETECTED NOT DETECTED Final   Candida parapsilosis NOT DETECTED NOT DETECTED Final   Candida tropicalis NOT DETECTED NOT DETECTED Final  C difficile quick scan w PCR reflex      Status: None   Collection Time: 10/25/16  8:20 AM  Result Value Ref Range Status   C Diff antigen NEGATIVE NEGATIVE Final   C Diff toxin NEGATIVE NEGATIVE Final   C Diff interpretation No C. difficile detected.  Final  Culture, blood (Routine X 2) w Reflex to ID Panel     Status: None (Preliminary result)   Collection Time: 10/25/16  2:00 PM  Result Value Ref Range Status   Specimen Description BLOOD BLOOD LEFT ARM  Final   Special Requests BOTTLES DRAWN AEROBIC AND ANAEROBIC 10CC  Final   Culture NO GROWTH < 24 HOURS  Final   Report Status PENDING  Incomplete  Culture, blood (Routine X 2) w Reflex to ID Panel     Status: None (Preliminary result)   Collection Time: 10/25/16  2:15 PM  Result Value Ref Range Status   Specimen Description BLOOD BLOOD LEFT HAND  Final   Special Requests BOTTLES DRAWN AEROBIC AND ANAEROBIC 5CC  Final   Culture NO GROWTH < 24 HOURS  Final   Report Status PENDING  Incomplete    Studies/Results: Dg Chest Port 1 View  Result Date: 10/26/2016 CLINICAL DATA:  Dyspnea and left-sided chest pain EXAM: PORTABLE CHEST 1 VIEW COMPARISON:  10/24/2016 FINDINGS: Cardiac shadow is stable. Bilateral infiltrates are again identified and stable. No sizable effusion is seen. No bony abnormality is noted. IMPRESSION: No change in bilateral infiltrates. Electronically Signed   By: Alcide Clever M.D.   On: 10/26/2016 09:08      Assessment/Plan:  INTERVAL HISTORY: With worsening respiratory distress  Copious diarrhea  Scooter distress getting ready go to the ICU.  Blood culture still unfortunately persistently positive  Principal Problem:   Cellulitis Active Problems:   Hyperkalemia   Elevated liver enzymes   ARF (acute renal failure) (HCC)   Hepatitis C antibody test positive   Tobacco abuse   Hyponatremia   Hyperglycemia   Hypoalbuminemia due to protein-calorie malnutrition (HCC)   Normocytic anemia   Thrombocytopenia (HCC)   IVDU (intravenous drug user)    History of hepatitis C   Staphylococcus aureus bacteremia with sepsis (HCC)   Endocarditis of tricuspid valve   Aortic valve endocarditis   Severe aortic regurgitation   Severe tricuspid regurgitation   Osler's node   Septic embolism (HCC)   Hypoxemia   Bacteremia   Abscess or cellulitis of foot   Acute hypoxemic respiratory failure (HCC)    Joel Sheppard is a 45 y.o. male with   With relapsed IV drug use that is ongoing and methicillin sensitive staph aureus bacteremia with both right and left sided endocarditis with severe aortic regurgitation and moderate to severe tricuspid regurgitation with significant sized vegetations on both valves, Osler's nodes and concern for embolization to the kidneys given his creatinine is now 4 range. Not clearing his blood cultures yet in his respiratory distress is worsening  #1 Right and left sided endocarditis with concern for septic embolization to the kidney and also other potential organs. He has Osler's nodes on exam as well.  --Blood cultures are still positive I'll repeat them yet again   Try to keep him without a central line until we can clear his blood cultures  If not he will then later need a "central line holiday.  He needs a transesophageal echocardiogram along with formal cardiology and cardiothoracic surgery consult.   ( I discussed case w Dr Temple Pacini yesterTODAY)    #2 respiratory distress: could be multifactorial with renal failure and volume overload playing a role. Also would not doubt that he is embolizing his tricuspid vegetation into his lungs. ABG  showed significant hypoxemia --Agree with new to ICU for intubation  #3 acute renal failure could be due partly to prerenal failure setting of sepsis but I suspect he is also embolizing to his kidneys  #4 active IV drug use will recheck him for HIV hepatitis B and hepatitis C RNA. He tells me that he will be able to stop but he will clearly need intense help in stopping  this dangerous habit  #5 history of chronic hepatitis C infection the past status post treatment with HARVONI hopefully is not reacquired infection  #6 Diarrhea: fortunately not CDI  Dr. Drue Second will take over the service again tomorrow   LOS: 5 days   Acey Lav 10/26/2016, 12:41 PM [phone]

## 2016-10-27 ENCOUNTER — Inpatient Hospital Stay (HOSPITAL_COMMUNITY): Payer: Medicaid Other

## 2016-10-27 ENCOUNTER — Encounter (HOSPITAL_COMMUNITY): Admission: EM | Disposition: E | Payer: Self-pay | Source: Home / Self Care | Attending: Pulmonary Disease

## 2016-10-27 ENCOUNTER — Other Ambulatory Visit (HOSPITAL_COMMUNITY): Payer: Self-pay

## 2016-10-27 DIAGNOSIS — R509 Fever, unspecified: Secondary | ICD-10-CM

## 2016-10-27 DIAGNOSIS — B9689 Other specified bacterial agents as the cause of diseases classified elsewhere: Secondary | ICD-10-CM

## 2016-10-27 DIAGNOSIS — I2690 Septic pulmonary embolism without acute cor pulmonale: Secondary | ICD-10-CM

## 2016-10-27 DIAGNOSIS — L03115 Cellulitis of right lower limb: Secondary | ICD-10-CM

## 2016-10-27 DIAGNOSIS — I361 Nonrheumatic tricuspid (valve) insufficiency: Secondary | ICD-10-CM

## 2016-10-27 DIAGNOSIS — A4101 Sepsis due to Methicillin susceptible Staphylococcus aureus: Principal | ICD-10-CM

## 2016-10-27 DIAGNOSIS — L02419 Cutaneous abscess of limb, unspecified: Secondary | ICD-10-CM

## 2016-10-27 DIAGNOSIS — R6520 Severe sepsis without septic shock: Secondary | ICD-10-CM

## 2016-10-27 DIAGNOSIS — Z9911 Dependence on respirator [ventilator] status: Secondary | ICD-10-CM

## 2016-10-27 LAB — GLUCOSE, CAPILLARY
GLUCOSE-CAPILLARY: 90 mg/dL (ref 65–99)
GLUCOSE-CAPILLARY: 117 mg/dL — AB (ref 65–99)
GLUCOSE-CAPILLARY: 130 mg/dL — AB (ref 65–99)
Glucose-Capillary: 97 mg/dL (ref 65–99)
Glucose-Capillary: 110 mg/dL — ABNORMAL HIGH (ref 65–99)
Glucose-Capillary: 128 mg/dL — ABNORMAL HIGH (ref 65–99)

## 2016-10-27 LAB — CBC
HCT: 21.9 % — ABNORMAL LOW (ref 39.0–52.0)
HEMOGLOBIN: 7.4 g/dL — AB (ref 13.0–17.0)
MCH: 28.1 pg (ref 26.0–34.0)
MCHC: 33.8 g/dL (ref 30.0–36.0)
MCV: 83.3 fL (ref 78.0–100.0)
PLATELETS: 205 10*3/uL (ref 150–400)
RBC: 2.63 MIL/uL — ABNORMAL LOW (ref 4.22–5.81)
RDW: 15.8 % — AB (ref 11.5–15.5)
WBC: 12.4 10*3/uL — ABNORMAL HIGH (ref 4.0–10.5)

## 2016-10-27 LAB — BASIC METABOLIC PANEL
Anion gap: 10 (ref 5–15)
BUN: 134 mg/dL — ABNORMAL HIGH (ref 6–20)
CALCIUM: 7.4 mg/dL — AB (ref 8.9–10.3)
CO2: 19 mmol/L — ABNORMAL LOW (ref 22–32)
CREATININE: 2.51 mg/dL — AB (ref 0.61–1.24)
Chloride: 108 mmol/L (ref 101–111)
GFR, EST AFRICAN AMERICAN: 34 mL/min — AB (ref >60)
GFR, EST NON AFRICAN AMERICAN: 29 mL/min — AB (ref >60)
Glucose, Bld: 90 mg/dL (ref 65–99)
Potassium: 3.9 mmol/L (ref 3.5–5.1)
SODIUM: 137 mmol/L (ref 135–145)

## 2016-10-27 LAB — PHOSPHORUS
PHOSPHORUS: 8.3 mg/dL — AB (ref 2.5–4.6)
Phosphorus: 10.1 mg/dL — ABNORMAL HIGH (ref 2.5–4.6)

## 2016-10-27 LAB — MAGNESIUM
MAGNESIUM: 2.6 mg/dL — AB (ref 1.7–2.4)
MAGNESIUM: 2.7 mg/dL — AB (ref 1.7–2.4)

## 2016-10-27 LAB — CREATININE, URINE, RANDOM: CREATININE, URINE: 153 mg/dL

## 2016-10-27 LAB — SODIUM, URINE, RANDOM: Sodium, Ur: <10 mmol/L

## 2016-10-27 LAB — ECHO TEE: AVPHT: 93 ms

## 2016-10-27 SURGERY — ECHOCARDIOGRAM, TRANSESOPHAGEAL
Anesthesia: Monitor Anesthesia Care

## 2016-10-27 MED ORDER — VITAL AF 1.2 CAL PO LIQD
1000.0000 mL | ORAL | Status: DC
  Administered 2016-10-27 – 2016-10-28 (×2): 1000 mL

## 2016-10-27 MED ORDER — SODIUM CHLORIDE 0.9 % IV SOLN: INTRAVENOUS | Status: DC

## 2016-10-27 MED ORDER — ACETAMINOPHEN 160 MG/5ML PO SOLN
650.0000 mg | ORAL | Status: DC | PRN
  Administered 2016-10-27 – 2016-10-28 (×2): 650 mg
  Filled 2016-10-27 (×2): qty 20.3

## 2016-10-27 MED ORDER — SODIUM BICARBONATE 650 MG PO TABS
1300.0000 mg | ORAL_TABLET | Freq: Three times a day (TID) | ORAL | Status: DC
  Administered 2016-10-27 – 2016-10-29 (×7): 1300 mg via ORAL
  Filled 2016-10-27 (×6): qty 2

## 2016-10-27 MED ORDER — LANTHANUM CARBONATE 500 MG PO CHEW
1000.0000 mg | CHEWABLE_TABLET | Freq: Three times a day (TID) | ORAL | Status: DC
  Administered 2016-10-27 – 2016-10-30 (×9): 1000 mg via ORAL
  Filled 2016-10-27 (×14): qty 2

## 2016-10-27 NOTE — Progress Notes (Addendum)
[place] for Infectious Disease    Date of Admission:  10/01/2016   Total days of antibiotics 7        Day 5 nafcillin           ID: Joel NARDOZZI is a 45 y.o. male with hx of IVDU admitted for MSSA SAB sepsis, found to have severe AR with moderate sized vegetation and mod TR with large vegetation c/b renal and pulmonary septic emboli and respiratory distress requiring intubation on 11/26 Principal Problem:   Cellulitis Active Problems:   Hyperkalemia   Elevated liver enzymes   Acute renal failure (HCC)   Hepatitis C antibody test positive   Tobacco abuse   Hyponatremia   Hyperglycemia   Hypoalbuminemia due to protein-calorie malnutrition (HCC)   Normocytic anemia   Thrombocytopenia (HCC)   IVDU (intravenous drug user)   History of hepatitis C   Staphylococcus aureus bacteremia with sepsis (HCC)   Endocarditis of tricuspid valve   Aortic valve endocarditis   Severe aortic regurgitation   Severe tricuspid regurgitation   Osler's node   Septic embolism (HCC)   Hypoxemia   Bacteremia   Abscess or cellulitis of foot   Acute hypoxemic respiratory failure (HCC)   Acute bacterial endocarditis    Subjective: Febrile to 102.27F last night, currently afebrile.remains intubated and sedated  Recent events: seen by wound care who helped drainage of ankle abscesses. Seen by dr. Sallyanne Kuster for TEE. Albs reveal worsening kidney function  Medications:  . aspirin  324 mg Oral NOW   Or  . aspirin  300 mg Rectal NOW  . chlorhexidine gluconate (MEDLINE KIT)  15 mL Mouth Rinse BID  . famotidine (PEPCID) IV  20 mg Intravenous Q24H  . feeding supplement (PRO-STAT SUGAR FREE 64)  30 mL Per Tube BID  . feeding supplement (VITAL HIGH PROTEIN)  1,000 mL Per Tube Q24H  . heparin subcutaneous  5,000 Units Subcutaneous Q8H  . lanthanum  1,000 mg Oral TID WC  . mouth rinse  15 mL Mouth Rinse QID  . nafcillin IV  2 g Intravenous Q4H  . predniSONE  40 mg Oral Q breakfast  . sodium  bicarbonate  1,300 mg Oral TID  . sodium chloride flush  3 mL Intravenous Q12H    Objective: Vital signs in last 24 hours: Temp:  [98.3 F (36.8 C)-102.4 F (39.1 C)] 99.4 F (37.4 C) (11/27 0737) Pulse Rate:  [84-138] 102 (11/27 1000) Resp:  [15-31] 15 (11/27 1000) BP: (81-136)/(26-66) 95/33 (11/27 1000) SpO2:  [94 %-100 %] 97 % (11/27 1000) FiO2 (%):  [40 %-100 %] 40 % (11/27 0824) Weight:  [169 lb 8.5 oz (76.9 kg)] 169 lb 8.5 oz (76.9 kg) (11/27 0400)  Physical Exam  Constitutional: sedated, intubated. He appears well-developed and well-nourished. No distress.  HENT: OETT in place Neck: elevated JVD Cardiovascular: tachy, regular rhythm and normal heart sounds. Exam reveals no gallop and no friction rub.  No murmur heard.  Pulmonary/Chest: Effort normal and breath sounds normal. No respiratory distress. He has no wheezes.  Abdominal: Soft. Bowel sounds are decreased. He exhibits no distension. There is no tenderness.  Skin: Skin is warm and dry. Scattered abrasions, bilateral lesions of foot are covered, numerous tattoos to arms and torso     Lab Results  Recent Labs  10/26/16 1329 10/20/2016 0411  WBC 11.4* 12.4*  HGB 7.3* 7.4*  HCT 21.5* 21.9*  NA 135 137  K 3.6 3.9  CL 107 108  CO2 19* 19*  BUN 116* 134*  CREATININE 2.12* 2.51*   Liver Panel  Recent Labs  10/25/16 0513 10/26/16 1329  PROT 5.1* 4.7*  ALBUMIN 1.3* 1.1*  AST 68* 39  ALT 39 22  ALKPHOS 110 79  BILITOT 4.0* 3.2*    Microbiology: 11/26 resp cx reincubating 11/25 blood cx pending 11/24 blood cx MSSA 11/22 blood cx MSSA Studies/Results: Dg Chest Port 1 View  Result Date: 10/24/2016 CLINICAL DATA:  Respiratory failure. EXAM: PORTABLE CHEST 1 VIEW COMPARISON:  10/26/2016. FINDINGS: Endotracheal tube, NG tube, left IJ line stable position. Heart size stable. Diffuse bilateral pulmonary pulmonary infiltrates are again noted without interval change. Persistent basilar atelectasis. Small  left pleural effusion. No pneumothorax. IMPRESSION: 1. Lines and tubes in stable position.  No pneumothorax. 2. Diffuse bilateral pulmonary infiltrates without interval change. Persistent basilar atelectasis. Small left pleural effusion noted . Electronically Signed   By: Marcello Moores  Register   On: 10/22/2016 06:50   Dg Chest Portable 1 View  Result Date: 10/26/2016 CLINICAL DATA:  Status post intubation and central line placement EXAM: PORTABLE CHEST 1 VIEW COMPARISON:  10/26/2016 FINDINGS: Cardiac shadow is stable. Diffuse bilateral infiltrates are again identified and stable. A left jugular central line is noted with the tip in the proximal superior vena cava. No pneumothorax is seen. Nasogastric catheter is now noted within the stomach. Endotracheal tube is noted at the level of the aortic knob in satisfactory position. IMPRESSION: No change in bilateral infiltrates. Tubes and lines as described without complicating factors. Electronically Signed   By: Inez Catalina M.D.   On: 10/26/2016 13:57   Dg Chest Port 1 View  Result Date: 10/26/2016 CLINICAL DATA:  Dyspnea and left-sided chest pain EXAM: PORTABLE CHEST 1 VIEW COMPARISON:  10/24/2016 FINDINGS: Cardiac shadow is stable. Bilateral infiltrates are again identified and stable. No sizable effusion is seen. No bony abnormality is noted. IMPRESSION: No change in bilateral infiltrates. Electronically Signed   By: Inez Catalina M.D.   On: 10/26/2016 09:08   Dg Abd Portable 1v  Result Date: 10/26/2016 CLINICAL DATA:  Check gastric catheter placement EXAM: PORTABLE ABDOMEN - 1 VIEW COMPARISON:  None. FINDINGS: Scattered large and small bowel gas is noted. A nasogastric catheter is noted with the tip in the mid stomach. No other focal abnormality is seen. IMPRESSION: Gastric catheter in the mid stomach. Electronically Signed   By: Inez Catalina M.D.   On: 10/26/2016 13:58   TTE: - Left ventricle: The cavity size was normal. Wall thickness was   normal.  Systolic function was normal. The estimated ejection   fraction was in the range of 60% to 65%. Wall motion was normal;   there were no regional wall motion abnormalities. - Aortic valve: There was a medium-sized vegetation on the left   ventricular aspect. There was severe regurgitation. Valve area   (VTI): 2.11 cm^2. Valve area (Vmax): 2.01 cm^2. Valve area   (Vmean): 1.92 cm^2. - Tricuspid valve: There was a large vegetation on the right atrial   aspect. There was mild-moderate regurgitation. - Pulmonary arteries: Systolic pressure was mildly increased. PA   peak pressure: 39 mm Hg (S).   Assessment/Plan:  disseminated MSSA bacteremia with sepsis c/b AV and TV endocarditis = continue on nafcillin, will renally dose as his creatinine function declines. Suspect that his ongoing bacteremia is a reflection of disease burden to cardiac valves, with septic emboli. Awaiting TEE results as well as have CT surgery give input for surgery.  11/25 blood cx are negative at 42 hour mark  Fevers = reflection of burden of disease, with pulmonary and renal septic emboli/endocarditis, will continue to monitor. Repeat blood cx are negative thus far.  AKI = will continue to monitor, dose abtx accordingly. Continue with daily I/o's and cr. Could be worsening in part due to decrease EF  resp failure = maintained on vent at 40% FIO2 PEEP 5. Managed by PCCM.   Lower extremity abscess= appreciate wound care management and recs. Continue to use iodoform pack strip to help with drainage  Abscess of Aortic Annulus = continue to monitor on telemetry/ECG for conduction abnormality  Addendum TEE revealed opened abscess of the aortic annulus, flail AV cusp with severe AI. As well as a large vegetation to TV with leaflet peforation.Has also worsening cardiac output per TEE report   Will need CT surgery to evaluate.  Baxter Flattery [facility] Cell: [phone] Pager:  [phone]  10/17/2016, 10:29 AM

## 2016-10-27 NOTE — Progress Notes (Signed)
Pt placed back on full vent support due to pending TEE procedure.

## 2016-10-27 NOTE — Progress Notes (Signed)
Nutrition Follow-up  DOCUMENTATION CODES:   Severe malnutrition in context of chronic illness  INTERVENTION:    Restart TF after TEE with Vital AF 1.2 at goal rate of 80 ml/h (1920 ml per day) to provide 2304 kcals, 144 gm protein, 1557 ml free water daily.  NUTRITION DIAGNOSIS:   Malnutrition related to acute illness as evidenced by percent weight loss, energy intake < or equal to 75% for > or equal to 1 month, moderate depletion of body fat, moderate depletions of muscle mass.  Ongoing  GOAL:   Patient will meet greater than or equal to 90% of their needs  Progressing  MONITOR:   Vent status, Labs, TF tolerance, Skin, I & O's  REASON FOR ASSESSMENT:   Consult Enteral/tube feeding initiation and management  ASSESSMENT:   45 y.o. male with a PMH of IVDA (last used earlier today, injected a gray powder which he thought was fentanyl), hepatitis C status post treatment with Harvoni, recent hospitalization 09/05/16-09/06/16 for treatment of a heroin overdose, hepatitis C RNA not detected during that visit although he was noted to have elevated liver enzymes. He presents tonight with a chief complaint of burning foot pain. Was seen in the ED on 10/10/16 with erythema, warmth and tenderness of the left dorsal midfoot and right tibial tuberosity. Diagnosed with cellulitis and discharged on Septra 1 week. He presents today with worsening foot pain, severe in intensity, worse with weightbearing as well as a diffuse backache.  Patient required intubation on 11/26.  Patient is currently intubated on ventilator support MV: 14 L/min Temp (24hrs), Avg:99.6 F (37.6 C), Min:98.3 F (36.8 C), Max:102.4 F (39.1 C)  Labs reviewed phosphorus and magnesium elevated. Medications reviewed and include prednisone. Received MD Consult for TF initiation and management. TF was initiated yesterday, but has been off since midnight for TEE today.   Diet Order:  Diet NPO time specified Except for:  Sips with Meds  Skin:  Wound (see comment) (L foot & R ankle wounds-abscess under skin)  Last BM:  11/27  Height:   Ht Readings from Last 1 Encounters:  10/25/16 5\' 9"  (1.753 m)    Weight:   Wt Readings from Last 1 Encounters:  10/29/2016 169 lb 8.5 oz (76.9 kg)    Ideal Body Weight:  72.7 kg  BMI:  Body mass index is 25.04 kg/m.  Estimated Nutritional Needs:   Kcal:  2333  Protein:  125-145 gm  Fluid:  2.3 L  EDUCATION NEEDS:   No education needs identified at this time  Joaquin CourtsKimberly Harris, RD, LDN, CNSC Pager [phone] After Hours Pager [phone]

## 2016-10-27 NOTE — Consult Note (Addendum)
WOC Nurse wound consult note Reason for Consult: Consult requested for bilat foot blisters.   Wound type:Bilat feet with previous blisters which have ruptured and are peeling.  Dark purple red wound beds are fluctuant and have some thick tan pus when probed; appearance consistent with abscesses below the skin level.  Cut a slit in each area of nonviable tissue with scissors to allow for further drainage and large amt pus drained, no odor Measurement: Left anterior foot 2.2X1X.4cm Right outer ankle 3.5X3.5X.5cm Periwound: Generalized edema and erythremia surrounding wounds Dressing procedure/placement/frequency: Pack daily with iodoform packing strip to facilitate drainage and promote healing.  Pt is critically ill with multiple systemic factors which can impair healing.  No family members present to discuss plan of care. Please re-consult if further assistance is needed.  Thank-you,  Cammie Mcgeeawn Ziah Leandro MSN, RN, CWOCN, BradleyWCN-AP, CNS [phone]

## 2016-10-27 NOTE — Progress Notes (Signed)
eLink Physician-Brief Progress Note Patient Name: Nathaniel ManJames S Ackley DOB: 03/27/1971 MRN: [id number]   Date of Service  10/03/2016  HPI/Events of Note  tylenal to elixer  eICU Interventions       Intervention Category Minor Interventions: Routine modifications to care plan (e.g. PRN medications for pain, fever)  Nelda BucksFEINSTEIN,Nashly Olsson J. 10/02/2016, 12:12 AM

## 2016-10-27 NOTE — Progress Notes (Addendum)
PULMONARY / CRITICAL CARE MEDICINE   Name: Joel Sheppard MRN: [id number] DOB: 08/11/1971    ADMISSION DATE:  04/15/16 CONSULTATION DATE:  11/26  REFERRING MD:  Rito EhrlichKrishnan ([facility])   CHIEF COMPLAINT:  Respiratory distress   HISTORY OF PRESENT ILLNESS:   45yo male with hx Hep C, IVDA with recent hospitalization for heroin overdose initially admitted 11/21 with L foot and R tibial cellulitis.  He was found to have MSSA bacteremia with both R and L sided endocarditis and severe Aortic regurg and severe TR with significant sized vegetations on both valves as well as concern for septic emboli to the kidneys.  Has been on IV abx, followed by ID with ongoing positive blood cultures.  On 11/26 had progressive respiratory distress and requiring tx to ICU and urgent intubation with concern for septic pulmonary emboli.     SUBJECTIVE:  Levophed being tapered down. Sedated. Intubated.   VITAL SIGNS: BP (!) 99/36   Pulse 100   Temp 99.5 F (37.5 C) (Oral)   Resp (!) 27   Ht 5\' 9"  (1.753 m)   Wt 76.9 kg (169 lb 8.5 oz)   SpO2 99%   BMI 25.04 kg/m   HEMODYNAMICS: CVP:  [7 mmHg-14 mmHg] 12 mmHg  VENTILATOR SETTINGS: Vent Mode: PRVC FiO2 (%):  [40 %-60 %] 40 % Set Rate:  [20 bmp] 20 bmp Vt Set:  [570 mL] 570 mL PEEP:  [5 cmH20] 5 cmH20 Pressure Support:  [5 cmH20] 5 cmH20 Plateau Pressure:  [25 cmH20] 25 cmH20  INTAKE / OUTPUT: I/O last 3 completed shifts: In: 2708.5 [P.O.:560; I.V.:1443.5; NG/GT:155; IV Piggyback:550] Out: 1770 [Urine:1770]  PHYSICAL EXAMINATION: General:  Young male, sedated, intubated.  Neuro:  Sedated, intubated. (-) lat signs. CN grossly intact.  HEENT:  Mm moist, ETT  Cardiovascular:  s1s2 rrr Lungs:  Goo ae. Rhonchi at bases.  Abdomen:  Soft, +bs  Musculoskeletal:  Warm and dry, Gr 1 edema  LABS:  BMET  Recent Labs Lab 10/26/16 0735 10/26/16 1329 10/14/2016 0411  NA 134* 135 137  K 3.4* 3.6 3.9  CL 105 107 108  CO2 18* 19* 19*  BUN 120* 116*  134*  CREATININE 2.03* 2.12* 2.51*  GLUCOSE 129* 135* 90    Electrolytes  Recent Labs Lab 10/26/16 0735 10/26/16 1329 10/26/16 1817 10/07/2016 0411  CALCIUM 7.3* 6.8*  --  7.4*  MG  --  2.5* 2.7* 2.6*  PHOS  --  6.8* 7.2* 8.3*    CBC  Recent Labs Lab 10/26/16 0735 10/26/16 1329 10/17/2016 0411  WBC 14.0* 11.4* 12.4*  HGB 8.6* 7.3* 7.4*  HCT 25.1* 21.5* 21.9*  PLT 235 182 205    Coag's  Recent Labs Lab 10/22/16 0527 10/26/16 1329  APTT  --  32  INR 1.33 1.92    Sepsis Markers  Recent Labs Lab 04-05-2016 2105 10/22/16 0038 10/26/16 1330  LATICACIDVEN 1.71 1.31 2.8*    ABG  Recent Labs Lab 10/24/16 1412 10/26/16 1337  PHART 7.405 7.335*  PCO2ART 25.0* 36.5  PO2ART 70.0* 45.7*    Liver Enzymes  Recent Labs Lab 10/24/16 0349 10/25/16 0513 10/26/16 1329  AST 65* 68* 39  ALT 41 39 22  ALKPHOS 106 110 79  BILITOT 1.9* 4.0* 3.2*  ALBUMIN 1.2* 1.3* 1.1*    Cardiac Enzymes No results for input(s): TROPONINI, PROBNP in the last 168 hours.  Glucose  Recent Labs Lab 10/26/16 1726 10/26/16 2014 10/26/16 2349 10/03/2016 0422 10/13/2016 0736 10/26/2016 1213  GLUCAP  141* 119* 96 90 97 110*    Imaging Dg Chest Port 1 View  Result Date: 2016-11-17 CLINICAL DATA:  Respiratory failure. EXAM: PORTABLE CHEST 1 VIEW COMPARISON:  10/26/2016. FINDINGS: Endotracheal tube, NG tube, left IJ line stable position. Heart size stable. Diffuse bilateral pulmonary pulmonary infiltrates are again noted without interval change. Persistent basilar atelectasis. Small left pleural effusion. No pneumothorax. IMPRESSION: 1. Lines and tubes in stable position.  No pneumothorax. 2. Diffuse bilateral pulmonary infiltrates without interval change. Persistent basilar atelectasis. Small left pleural effusion noted . Electronically Signed   By: Maisie Fus  Register   On: 11/17/2016 06:50   Dg Abd Portable 1v  Result Date: 2016-11-17 CLINICAL DATA:  Feeding tube placed EXAM:  PORTABLE ABDOMEN - 1 VIEW COMPARISON:  10/26/2016 FINDINGS: NG tube tip is in the mid stomach. Gas-filled loops of small and large bowel are not significantly changed. There is no obvious free intraperitoneal gas. IMPRESSION: NG tube tip is in the mid stomach. This is not significantly changed. Additional enteric tubes are not visualized. Electronically Signed   By: Jolaine Click M.D.   On: 11/17/2016 13:09     STUDIES:  Renal u/s 11/24>>> 1. No mass or hydronephrosis of the kidneys.  2. Bilateral pleural effusions and trace perihepatic ascites. 2D echo 11/22>>> EF 60-65%, med-sized vegetation on aortic valve with severe regurg, large vegetation tricuspid valve with mod TR, PA pressure  TEE 11/27 >> Flail aortic valve cusp, severe aortic insufficiency. Opened abscess of the aortic annulus, located anteriorly and to the left, between the ostia of the right and left coronary arteries, without direct involvement of the coronary ostia. Large vegetation on the tricuspid valve with leaflet perforation and severe tricuspid insufficiency. 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   ASSESSMENT / PLAN:  PULMONARY Acute hypoxemic respiratory failure 2/2 septic emboli from MSSA infective endocarditis, pulm edema P:   Vent support - 8cc/kg. No weaning.  Responded to diuresis and lasix has been held 11/26 2/2 hypotension.  Consider CT chest once more stable  abx as above. ID following.    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 Severe TR and AI. EF  45-50% Hypotension - r/t sepsis and sedation needs and diuresis P:  See ID  Cardiology, CVTS aware. Cards consulted TCVS per note.   RENAL AKI - multifactorial from sepsis and likely septic emboli  Metabolic acidosis  P:   Monitor UOP  F/u chem  Keeping euvolemic. Holding off on diuresis 2/2 hypotension   GASTROINTESTINAL Hep C infection  Diarrhea - CDiff neg P:   TF  Plan to recheck for HIV, hepatitis B, hepatitis C per infectious disease recommendations  HEMATOLOGIC Anemia of chronic disease  P:  F/u CBC  SQ heparin   INFECTIOUS Infective endocarditis. Septic emboli.  Possible HCAP.  BLE cellulitis - no evidence of abscess R foot CT P:   ID following  Nafcillin  6-8 weeks IV abx  CVTS following   ENDOCRINE No active issue P:   Monitor glucose on chem   NEUROLOGIC Sedation needs on vent -- VERY difficult to sedate  Polysubstance abuse, IVDA (  shoots fentanyl) P:   RASS goal: -2 Fentanyl, propofol, versed gtts  Wean propofol off as able  Daily WUA    FAMILY  - Updates:  Brother updated 11/26 by Dr. Rito EhrlichKrishnan and RN. I tried calling brother on 11/27 > left VM.   - Inter-disciplinary family meet or Palliative Care meeting due by:  12/3   I have spent 30  minutes of critical care time with this patient today.     Pollie MeyerJ. Angelo A de Dios, MD 10/19/2016, 2:37 PM [facility] Pager [phone] After 3 pm or if no answer, call [phone]

## 2016-10-27 NOTE — Progress Notes (Signed)
PT Cancellation Note  Patient Details Name: Joel Sheppard MRN: [id number] DOB: 08/13/1971   Cancelled Treatment:    Reason Eval/Treat Not Completed: Medical issues which prohibited therapy (pt with medical decline and intubation after evaluation ordered. Will sign off and await new order)   Ashvik Grundman B Eder Macek 07-09-16, 9:42 AM  Delaney MeigsMaija Tabor Oral Remache, PT [phone]

## 2016-10-27 NOTE — Progress Notes (Signed)
Discussed purpose of, pros and cons of TEE with patient's brother, Trey PaulaJeff. This procedure has been fully reviewed with him and informed consent has been obtained. Thurmon FairMihai Murry Khiev, MD, [facility] Center[facility]ACC [facility] [phone] office [phone] pager 8:35 AM

## 2016-10-27 NOTE — Progress Notes (Signed)
OT Cancellation Note  Patient Details Name: Joel Sheppard MRN: [id number] DOB: 06/22/1971   Cancelled Treatment:    Reason Eval/Treat Not Completed: Medical issues which prohibited therapy.  Pt with medical decline requiring intubation and pressors.  Will sign off at this time, and please re-order both PT/OT when pt medically stable to participate.  Vici Novick [place], OTR/L [phone]   Jeani HawkingConarpe, Sadie Pickar M 10/06/2016, 11:40 AM

## 2016-10-27 NOTE — Procedures (Signed)
INDICATIONS: infective endocarditis  PROCEDURE:   Informed consent was obtained prior to the procedure, from the patient's brother, Trey PaulaJeff. The risks, benefits and alternatives for the procedure were discussed and the patient comprehended these risks.  Risks include, but are not limited to, cough, sore throat, vomiting, nausea, somnolence, esophageal and stomach trauma or perforation, bleeding, low blood pressure, aspiration, pneumonia, infection, trauma to the teeth and death.    During this procedure the patient was already intubated and sedated..  The transesophageal probe was inserted in the esophagus and stomach without difficulty and multiple views were obtained.    Agitated microbubble saline contrast was not administered.  COMPLICATIONS:    There were no immediate complications.  FINDINGS:  Flail aortic valve cusp, severe aortic insufficiency. Opened abscess of the aortic annulus, located anteriorly and to the left, between the ostia of the right and left coronary arteries, without direct involvement of the coronary ostia. Large vegetation on the tricuspid valve with leaflet perforation and severe tricuspid insufficiency. Mildly depressed left ventricular systolic function, EF 45-50%, substantially lower compared to transthoracic echo performed 10/22/16.  RECOMMENDATIONS:    Surgical evaluation.  Time Spent Directly with the Patient:  30 minutes   Tamiya Colello 10/19/2016, 12:35 PM

## 2016-10-27 NOTE — Progress Notes (Signed)
  Echocardiogram Echocardiogram Transesophageal has been performed.  Joel Sheppard, Ray Gervasi 10/15/2016, 1:16 PM

## 2016-10-27 NOTE — Progress Notes (Signed)
Subjective: Interval History:  Sedated, aggitated when not awake.  Objective: Vital signs in last 24 hours: Temp:  [98.3 F (36.8 C)-102.4 F (39.1 C)] 99.6 F (37.6 C) (11/27 0434) Pulse Rate:  [84-138] 95 (11/27 0630) Resp:  [15-31] 17 (11/27 0630) BP: (81-136)/(26-66) 92/37 (11/27 0630) SpO2:  [94 %-100 %] 100 % (11/27 0630) FiO2 (%):  [40 %-100 %] 50 % (11/27 0400) Weight:  [76.9 kg (169 lb 8.5 oz)] 76.9 kg (169 lb 8.5 oz) (11/27 0400) Weight change: 1.7 kg (3 lb 12 oz)  Intake/Output from previous day: 11/26 0701 - 11/27 0700 In: 2148.5 [I.V.:1443.5; NG/GT:155; IV Piggyback:550] Out: 670 [Urine:670] Intake/Output this shift: No intake/output data recorded.  General appearance: pale, on vent opens eyes Resp: on vent, sedated,  Cardio: S1, S2 normal, systolic murmur: systolic ejection 2/6, decrescendo at 2nd left intercostal space, 2nd systolic murmur: holosystolic 2/6, blowing at lower left sternal border and diastolic murmur: early diastolic 3/6, decrescendo at 2nd left intercostal space GI: pos bs, liver down 5 cm,  Extremities: edema 2-3+  Resp scattered rhonchi  Lab Results:  Recent Labs  10/26/16 1329 03/29/16 0411  WBC 11.4* 12.4*  HGB 7.3* 7.4*  HCT 21.5* 21.9*  PLT 182 205   BMET:  Recent Labs  10/26/16 1329 03/29/16 0411  NA 135 137  K 3.6 3.9  CL 107 108  CO2 19* 19*  GLUCOSE 135* 90  BUN 116* 134*  CREATININE 2.12* 2.51*  CALCIUM 6.8* 7.4*   No results for input(s): PTH in the last 72 hours. Iron Studies: No results for input(s): IRON, TIBC, TRANSFERRIN, FERRITIN in the last 72 hours.  Studies/Results: Dg Chest Port 1 View  Result Date: 09/12/16 CLINICAL DATA:  Respiratory failure. EXAM: PORTABLE CHEST 1 VIEW COMPARISON:  10/26/2016. FINDINGS: Endotracheal tube, NG tube, left IJ line stable position. Heart size stable. Diffuse bilateral pulmonary pulmonary infiltrates are again noted without interval change. Persistent basilar  atelectasis. Small left pleural effusion. No pneumothorax. IMPRESSION: 1. Lines and tubes in stable position.  No pneumothorax. 2. Diffuse bilateral pulmonary infiltrates without interval change. Persistent basilar atelectasis. Small left pleural effusion noted . Electronically Signed   By: Maisie Fushomas  Register   On: 09/12/16 06:50   Dg Chest Portable 1 View  Result Date: 10/26/2016 CLINICAL DATA:  Status post intubation and central line placement EXAM: PORTABLE CHEST 1 VIEW COMPARISON:  10/26/2016 FINDINGS: Cardiac shadow is stable. Diffuse bilateral infiltrates are again identified and stable. A left jugular central line is noted with the tip in the proximal superior vena cava. No pneumothorax is seen. Nasogastric catheter is now noted within the stomach. Endotracheal tube is noted at the level of the aortic knob in satisfactory position. IMPRESSION: No change in bilateral infiltrates. Tubes and lines as described without complicating factors. Electronically Signed   By: Alcide CleverMark  Lukens M.D.   On: 10/26/2016 13:57   Dg Chest Port 1 View  Result Date: 10/26/2016 CLINICAL DATA:  Dyspnea and left-sided chest pain EXAM: PORTABLE CHEST 1 VIEW COMPARISON:  10/24/2016 FINDINGS: Cardiac shadow is stable. Bilateral infiltrates are again identified and stable. No sizable effusion is seen. No bony abnormality is noted. IMPRESSION: No change in bilateral infiltrates. Electronically Signed   By: Alcide CleverMark  Lukens M.D.   On: 10/26/2016 09:08   Dg Abd Portable 1v  Result Date: 10/26/2016 CLINICAL DATA:  Check gastric catheter placement EXAM: PORTABLE ABDOMEN - 1 VIEW COMPARISON:  None. FINDINGS: Scattered large and small bowel gas is noted. A nasogastric catheter  is noted with the tip in the mid stomach. No other focal abnormality is seen. IMPRESSION: Gastric catheter in the mid stomach. Electronically Signed   By: Alcide CleverMark  Lukens M.D.   On: 10/26/2016 13:58    I have reviewed the patient's current  medications.  Assessment/Plan: 1 AKI embolic vs AGN from SBE, vs hemodynamic.  Azotemic, slow rise Cr.  ^ Bun from catabolic state/infx. Mild vol xs, acidemic 2 Endocarditis on AB, ? Plan 3 IVDU 4 Anemia 5 Hx Hepatitis 6 Shock now on pressors again P ^ bicarb, vent, AB, ?plan,     LOS: 6 days   Kaarin Pardy,Amad L 2016-10-05,7:14 AM

## 2016-10-27 NOTE — Progress Notes (Signed)
Patient Name: Joel Sheppard Date of Encounter: 10/06/2016  Primary Cardiologist: [facility] Problem List     Principal Problem:   Cellulitis Active Problems:   Hyperkalemia   Elevated liver enzymes   Acute renal failure (HCC)   Hepatitis C antibody test positive   Tobacco abuse   Hyponatremia   Hyperglycemia   Hypoalbuminemia due to protein-calorie malnutrition (HCC)   Normocytic anemia   Thrombocytopenia (HCC)   IVDU (intravenous drug user)   History of hepatitis C   Staphylococcus aureus bacteremia with sepsis (HCC)   Endocarditis of tricuspid valve   Aortic valve endocarditis   Severe aortic regurgitation   Severe tricuspid regurgitation   Osler's node   Septic embolism (HCC)   Hypoxemia   Bacteremia   Abscess or cellulitis of foot   Acute hypoxemic respiratory failure (HCC)   Acute bacterial endocarditis     Subjective   Sedated but awakens and nods head, nods yes when asked if having pain.   Inpatient Medications    Scheduled Meds: . chlorhexidine gluconate (MEDLINE KIT)  15 mL Mouth Rinse BID  . famotidine (PEPCID) IV  20 mg Intravenous Q24H  . feeding supplement (PRO-STAT SUGAR FREE 64)  30 mL Per Tube BID  . feeding supplement (VITAL HIGH PROTEIN)  1,000 mL Per Tube Q24H  . heparin subcutaneous  5,000 Units Subcutaneous Q8H  . lanthanum  1,000 mg Oral TID WC  . mouth rinse  15 mL Mouth Rinse QID  . nafcillin IV  2 g Intravenous Q4H  . predniSONE  40 mg Oral Q breakfast  . sodium bicarbonate  1,300 mg Oral TID  . sodium chloride flush  3 mL Intravenous Q12H   Continuous Infusions: . sodium chloride    . fentaNYL infusion INTRAVENOUS 200 mcg/hr (10/11/2016 1000)  . midazolam (VERSED) infusion 2 mg/hr (10/25/2016 1000)  . norepinephrine (LEVOPHED) Adult infusion 3 mcg/min (10/26/2016 1000)   PRN Meds: sodium chloride, acetaminophen (TYLENOL) oral liquid 160 mg/5 mL, fentaNYL, midazolam, sodium chloride   Vital Signs    Vitals:   10/14/2016  0800 10/29/2016 0824 10/23/2016 0900 10/08/2016 1000  BP: (!) 89/34 (!) 93/35 (!) 100/40 (!) 95/33  Pulse: 96 94 99 (!) 102  Resp: 16 (!) _0 Temp:      TempSrc:      SpO2: 100% 100% 95% 97%  Weight:      Height:        Intake/Output Summary (Last 24 hours) at 10/14/2016 1107 Last data filed at 10/24/2016 1000  Gross per 24 hour  Intake          2225.39 ml  Output              690 ml  Net          1535.39 ml   Filed Weights   10/25/16 0500 10/25/16 2021 10/09/2016 0400  Weight: 166 lb (75.3 kg) 165 lb 12.6 oz (75.2 kg) 169 lb 8.5 oz (76.9 kg)    Physical Exam    General: ill-appearing man, intubated in bed HEENT: LIJ line in place, ETT in place Cardiac: tachycardic, 2/6 systolic murmur loudest at RUS border, 3/6 diastolic murmur loudest at left lower sternal border Pulm: coarse breath sounds anteriorly, vent supported Abd: nondistended, BS present Ext: Swelling LLE, wound dressing on bilateral feet, SCDs and mittens in place Neuro: sedated but alert, nods head   Labs    CBC  Recent Labs  10/25/16 0513  10/26/16  1329 10/25/2016 0411  WBC 10.5  < > 11.4* 12.4*  NEUTROABS 8.9*  --   --   --   HGB 9.3*  < > 7.3* 7.4*  HCT 26.5*  < > 21.5* 21.9*  MCV 80.3  < > 81.7 83.3  PLT 186  < > 182 205  < > = values in this interval not displayed. Basic Metabolic Panel  Recent Labs  10/26/16 1329 10/26/16 1817 10/15/2016 0411  NA 135  --  137  K 3.6  --  3.9  CL 107  --  108  CO2 19*  --  19*  GLUCOSE 135*  --  90  BUN 116*  --  134*  CREATININE 2.12*  --  2.51*  CALCIUM 6.8*  --  7.4*  MG 2.5* 2.7* 2.6*  PHOS 6.8* 7.2* 8.3*   Liver Function Tests  Recent Labs  10/25/16 0513 10/26/16 1329  AST 68* 39  ALT 39 22  ALKPHOS 110 79  BILITOT 4.0* 3.2*  PROT 5.1* 4.7*  ALBUMIN 1.3* 1.1*   No results for input(s): LIPASE, AMYLASE in the last 72 hours. Cardiac Enzymes No results for input(s): CKTOTAL, CKMB, CKMBINDEX, TROPONINI in the last 72 hours. BNP Invalid  input(s): POCBNP D-Dimer No results for input(s): DDIMER in the last 72 hours. Hemoglobin A1C No results for input(s): HGBA1C in the last 72 hours. Fasting Lipid Panel No results for input(s): CHOL, HDL, LDLCALC, TRIG, CHOLHDL, LDLDIRECT in the last 72 hours. Thyroid Function Tests No results for input(s): TSH, T4TOTAL, T3FREE, THYROIDAB in the last 72 hours.  Invalid input(s): FREET3  Telemetry    Sinus tach, PVCs - Personally Reviewed  ECG    11/25: Sinus tach, PVC - Personally Reviewed  Radiology    Dg Chest Port 1 View  Result Date: 10/17/2016 CLINICAL DATA:  Respiratory failure. EXAM: PORTABLE CHEST 1 VIEW COMPARISON:  10/26/2016. FINDINGS: Endotracheal tube, NG tube, left IJ line stable position. Heart size stable. Diffuse bilateral pulmonary pulmonary infiltrates are again noted without interval change. Persistent basilar atelectasis. Small left pleural effusion. No pneumothorax. IMPRESSION: 1. Lines and tubes in stable position.  No pneumothorax. 2. Diffuse bilateral pulmonary infiltrates without interval change. Persistent basilar atelectasis. Small left pleural effusion noted . Electronically Signed   By: Marcello Moores  Register   On: 10/06/2016 06:50   Dg Chest Portable 1 View  Result Date: 10/26/2016 CLINICAL DATA:  Status post intubation and central line placement EXAM: PORTABLE CHEST 1 VIEW COMPARISON:  10/26/2016 FINDINGS: Cardiac shadow is stable. Diffuse bilateral infiltrates are again identified and stable. A left jugular central line is noted with the tip in the proximal superior vena cava. No pneumothorax is seen. Nasogastric catheter is now noted within the stomach. Endotracheal tube is noted at the level of the aortic knob in satisfactory position. IMPRESSION: No change in bilateral infiltrates. Tubes and lines as described without complicating factors. Electronically Signed   By: Inez Catalina M.D.   On: 10/26/2016 13:57   Dg Chest Port 1 View  Result Date:  10/26/2016 CLINICAL DATA:  Dyspnea and left-sided chest pain EXAM: PORTABLE CHEST 1 VIEW COMPARISON:  10/24/2016 FINDINGS: Cardiac shadow is stable. Bilateral infiltrates are again identified and stable. No sizable effusion is seen. No bony abnormality is noted. IMPRESSION: No change in bilateral infiltrates. Electronically Signed   By: Inez Catalina M.D.   On: 10/26/2016 09:08   Dg Abd Portable 1v  Result Date: 10/26/2016 CLINICAL DATA:  Check gastric catheter placement EXAM: PORTABLE  ABDOMEN - 1 VIEW COMPARISON:  None. FINDINGS: Scattered large and small bowel gas is noted. A nasogastric catheter is noted with the tip in the mid stomach. No other focal abnormality is seen. IMPRESSION: Gastric catheter in the mid stomach. Electronically Signed   By: Inez Catalina M.D.   On: 10/26/2016 13:58    Cardiac Studies   TTE 10/22/2016: - Left ventricle: The cavity size was normal. Wall thickness was   normal. Systolic function was normal. The estimated ejection   fraction was in the range of 60% to 65%. Wall motion was normal;   there were no regional wall motion abnormalities. - Aortic valve: There was a medium-sized vegetation on the left   ventricular aspect. There was severe regurgitation. Valve area   (VTI): 2.11 cm^2. Valve area (Vmax): 2.01 cm^2. Valve area   (Vmean): 1.92 cm^2. - Tricuspid valve: There was a large vegetation on the right atrial   aspect. There was mild-moderate regurgitation. - Pulmonary arteries: Systolic pressure was mildly increased. PA   peak pressure: 39 mm Hg (S).  Impressions:  - Normal LV function   There is evidence of aortic valve endocarditis and tricuspid   valve endocarditis  Patient Profile     45 y.o. 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  Assessment & Plan    Cardiogenic/Septic shock: CVP 12 this morning. Requiring pressors, currently on Levophed. Blood pressures soft with MAP 47-57 this morning.  Aortic Valve and Tricuspid Valve Endocarditis: Medium sized vegetation with severe regurgitation of the aortic valve and large vegetation with mild-moderate regurgitation of the tricuspid valve seen on TTE. Blood cultures are growing MSSA for which he is being treated with Nafcillin. Would need aortic valve replacement, however situation is difficult with ongoing bacteremia and IV drug abuse. TEE planned for today to further evaluate valvular anatomy and for perivalvular abscess.  -Plan for bedside TEE today -CVTS consulted -ID following -Repeat blood cx (11/25) NGTD <24h  Acute Respiratory Failure: Likely secondary to septic emboli and pulmonary edema, possible HCAP. Intubated 11/26, PCCM managing vent.  Acute Renal Failure: Likely secondary to septic emboli and sepsis. Nephrology following.    Signed, Zada Finders, MD  PGY-2 Internal Medicine Resident 10/18/2016, 11:07 AM  As above, patient seen and examined. Patient remains critically ill requiring pressors for blood pressure support. He has aortic and tricuspid valve endocarditis. Results of TEE show flail aortic valve cusp with severe aortic insufficiency, abscess of aortic annulus and large tricuspid valve vegetation with severe tricuspid regurgitation. Ejection fraction mildly reduced. Continue antibiotics. Await surgical consult but certainly not clear he would be a candidate for AVR/TVR. Otherwise prognosis appears to be extremely poor.  Kirk Ruths, MD

## 2016-10-28 ENCOUNTER — Inpatient Hospital Stay (HOSPITAL_COMMUNITY): Payer: Medicaid Other

## 2016-10-28 DIAGNOSIS — I361 Nonrheumatic tricuspid (valve) insufficiency: Secondary | ICD-10-CM

## 2016-10-28 DIAGNOSIS — I351 Nonrheumatic aortic (valve) insufficiency: Secondary | ICD-10-CM

## 2016-10-28 LAB — BLOOD GAS, ARTERIAL
Acid-base deficit: 8.8 mmol/L — ABNORMAL HIGH (ref 0.0–2.0)
BICARBONATE: 15.8 mmol/L — AB (ref 20.0–28.0)
Drawn by: 406621
FIO2: 40
O2 SAT: 97.6 %
PATIENT TEMPERATURE: 98.6
PCO2 ART: 30.2 mmHg — AB (ref 32.0–48.0)
PEEP/CPAP: 5 cmH2O
PO2 ART: 95.2 mmHg (ref 83.0–108.0)
PRESSURE SUPPORT: 5 cmH2O
pH, Arterial: 7.339 — ABNORMAL LOW (ref 7.350–7.450)

## 2016-10-28 LAB — RENAL FUNCTION PANEL
ALBUMIN: 1 g/dL — AB (ref 3.5–5.0)
ANION GAP: 10 (ref 5–15)
BUN: 156 mg/dL — ABNORMAL HIGH (ref 6–20)
CALCIUM: 7.4 mg/dL — AB (ref 8.9–10.3)
CO2: 18 mmol/L — ABNORMAL LOW (ref 22–32)
Chloride: 108 mmol/L (ref 101–111)
Creatinine, Ser: 3.39 mg/dL — ABNORMAL HIGH (ref 0.61–1.24)
GFR, EST AFRICAN AMERICAN: 24 mL/min — AB (ref >60)
GFR, EST NON AFRICAN AMERICAN: 20 mL/min — AB (ref >60)
GLUCOSE: 133 mg/dL — AB (ref 65–99)
PHOSPHORUS: 9.5 mg/dL — AB (ref 2.5–4.6)
POTASSIUM: 4.2 mmol/L (ref 3.5–5.1)
SODIUM: 136 mmol/L (ref 135–145)

## 2016-10-28 LAB — CBC
HEMATOCRIT: 23.7 % — AB (ref 39.0–52.0)
HEMOGLOBIN: 7.9 g/dL — AB (ref 13.0–17.0)
MCH: 28 pg (ref 26.0–34.0)
MCHC: 33.3 g/dL (ref 30.0–36.0)
MCV: 84 fL (ref 78.0–100.0)
Platelets: 368 10*3/uL (ref 150–400)
RBC: 2.82 MIL/uL — AB (ref 4.22–5.81)
RDW: 16.2 % — ABNORMAL HIGH (ref 11.5–15.5)
WBC: 16.8 10*3/uL — ABNORMAL HIGH (ref 4.0–10.5)

## 2016-10-28 LAB — GLUCOSE, CAPILLARY
GLUCOSE-CAPILLARY: 154 mg/dL — AB (ref 65–99)
GLUCOSE-CAPILLARY: 144 mg/dL — AB (ref 65–99)
GLUCOSE-CAPILLARY: 123 mg/dL — AB (ref 65–99)
Glucose-Capillary: 139 mg/dL — ABNORMAL HIGH (ref 65–99)
Glucose-Capillary: 119 mg/dL — ABNORMAL HIGH (ref 65–99)

## 2016-10-28 LAB — CORTISOL: CORTISOL PLASMA: 8.3 ug/dL

## 2016-10-28 LAB — CULTURE, BLOOD (ROUTINE X 2): CULTURE: NO GROWTH

## 2016-10-28 LAB — MAGNESIUM: MAGNESIUM: 2.8 mg/dL — AB (ref 1.7–2.4)

## 2016-10-28 MED ORDER — HYDROCORTISONE NA SUCCINATE PF 100 MG IJ SOLR
50.0000 mg | Freq: Four times a day (QID) | INTRAMUSCULAR | Status: DC
  Administered 2016-10-28 – 2016-10-30 (×7): 50 mg via INTRAVENOUS
  Filled 2016-10-28 (×2): qty 1
  Filled 2016-10-28: qty 2
  Filled 2016-10-28 (×5): qty 1

## 2016-10-28 MED ORDER — FENTANYL CITRATE (PF) 100 MCG/2ML IJ SOLN: 100.0000 ug | INTRAMUSCULAR | Status: DC | PRN

## 2016-10-28 MED ORDER — DEXMEDETOMIDINE HCL IN NACL 200 MCG/50ML IV SOLN: 0.4000 ug/kg/h | INTRAVENOUS | Status: DC

## 2016-10-28 MED ORDER — SODIUM CHLORIDE 0.9 % IV SOLN
1.0000 mg/h | INTRAVENOUS | Status: DC
  Administered 2016-10-28: 1 mg/h via INTRAVENOUS
  Filled 2016-10-28 (×2): qty 10

## 2016-10-28 MED ORDER — CEFAZOLIN SODIUM-DEXTROSE 2-4 GM/100ML-% IV SOLN
2.0000 g | Freq: Two times a day (BID) | INTRAVENOUS | Status: DC
  Administered 2016-10-28 – 2016-10-29 (×2): 2 g via INTRAVENOUS
  Filled 2016-10-28 (×3): qty 100

## 2016-10-28 MED ORDER — INSULIN ASPART 100 UNIT/ML ~~LOC~~ SOLN
1.0000 [IU] | SUBCUTANEOUS | Status: DC
  Administered 2016-10-28: 2 [IU] via SUBCUTANEOUS
  Administered 2016-10-28 – 2016-10-29 (×3): 1 [IU] via SUBCUTANEOUS
  Administered 2016-10-29: 2 [IU] via SUBCUTANEOUS
  Administered 2016-10-29: 1 [IU] via SUBCUTANEOUS

## 2016-10-28 MED ORDER — PROPOFOL 1000 MG/100ML IV EMUL
0.0000 ug/kg/min | INTRAVENOUS | Status: DC
Start: 2016-10-28 — End: 2016-10-29
  Administered 2016-10-28: 20 ug/kg/min via INTRAVENOUS
  Administered 2016-10-29: 15 ug/kg/min via INTRAVENOUS
  Filled 2016-10-28 (×2): qty 100

## 2016-10-28 NOTE — Progress Notes (Signed)
PULMONARY / CRITICAL CARE MEDICINE   Name: Joel Sheppard MRN: [id number] DOB: 03/31/1971    ADMISSION DATE:  10/06/2016 CONSULTATION DATE:  11/26  REFERRING MD:  Rito EhrlichKrishnan ([facility])   CHIEF COMPLAINT:  Respiratory distress   BRIEF:  45yo male with hx Hep C, IVDA with recent hospitalization for heroin overdose initially admitted 11/21 with L foot and R tibial cellulitis.  He was found to have MSSA bacteremia with both R and L sided endocarditis and severe Aortic regurg and severe TR with significant sized vegetations on both valves as well as concern for septic emboli to the kidneys.  Has been on IV abx, followed by ID with ongoing positive blood cultures.  On 11/26 had progressive respiratory distress and requiring tx to ICU and urgent intubation with concern for septic pulmonary emboli.    SIGNIFICANT EVENTS: 11/21: admitted for cellulitis and MSSA bacteremia  11/26: progressive respiratory distress, intubated   SUBJECTIVE:  Agitated overnight. Still requiring Levophed for pressure support. CVP of 11  VITAL SIGNS: BP (!) 90/39   Pulse 95   Temp 98.7 F (37.1 C) (Oral)   Resp 17   Ht 5\' 9"  (1.753 m)   Wt 78.9 kg (173 lb 15.1 oz)   SpO2 97%   BMI 25.69 kg/m   HEMODYNAMICS: CVP:  [9 mmHg-12 mmHg] 12 mmHg  VENTILATOR SETTINGS: Vent Mode: PRVC FiO2 (%):  [40 %] 40 % Set Rate:  [20 bmp] 20 bmp Vt Set:  [570 mL] 570 mL PEEP:  [5 cmH20] 5 cmH20 Plateau Pressure:  [16 cmH20-26 cmH20] 26 cmH20  INTAKE / OUTPUT: I/O last 3 completed shifts: In: 4500 [I.V.:2045; NG/GT:1605; IV Piggyback:850] Out: 1140 [Urine:1140]  PHYSICAL EXAMINATION: General:  Chronically ill appearing male, sedated, intubated.  Neuro:  Sedated, intubated. Moves in response to pain.  HEENT:   ETT, Parker/AT, PERRL, EOM-I Cardiovascular:  S1s2. Holosystolic murmur.  Lungs:  Good air movement. Rhonchi diffusely.  Abdomen:  Soft, +bs, NT, ND Musculoskeletal:  Warm and dry, Gr 1  edema  LABS:  BMET  Recent Labs Lab 10/26/16 1329 May 15, 2016 0411 10/28/16 0403  NA 135 137 136  K 3.6 3.9 4.2  CL 107 108 108  CO2 19* 19* 18*  BUN 116* 134* 156*  CREATININE 2.12* 2.51* 3.39*  GLUCOSE 135* 90 133*    Electrolytes  Recent Labs Lab 10/26/16 1329  May 15, 2016 0411 May 15, 2016 1646 10/28/16 0403  CALCIUM 6.8*  --  7.4*  --  7.4*  MG 2.5*  < > 2.6* 2.7* 2.8*  PHOS 6.8*  < > 8.3* 10.1* 9.5*  < > = values in this interval not displayed.  CBC  Recent Labs Lab 10/26/16 1329 May 15, 2016 0411 10/28/16 0402  WBC 11.4* 12.4* 16.8*  HGB 7.3* 7.4* 7.9*  HCT 21.5* 21.9* 23.7*  PLT 182 205 368   Coag's  Recent Labs Lab 10/22/16 0527 10/26/16 1329  APTT  --  32  INR 1.33 1.92   Sepsis Markers  Recent Labs Lab 10/04/2016 2105 10/22/16 0038 10/26/16 1330  LATICACIDVEN 1.71 1.31 2.8*   ABG  Recent Labs Lab 10/24/16 1412 10/26/16 1337  PHART 7.405 7.335*  PCO2ART 25.0* 36.5  PO2ART 70.0* 45.7*    Liver Enzymes  Recent Labs Lab 10/24/16 0349 10/25/16 0513 10/26/16 1329 10/28/16 0403  AST 65* 68* 39  --   ALT 41 39 22  --   ALKPHOS 106 110 79  --   BILITOT 1.9* 4.0* 3.2*  --   ALBUMIN 1.2* 1.3*  1.1* 1.0*   Cardiac Enzymes No results for input(s): TROPONINI, PROBNP in the last 168 hours.  Glucose  Recent Labs Lab 10/07/2016 1213 10/20/2016 1530 10/07/2016 1943 10/10/2016 2323 10/28/16 0354 10/28/16 0753  GLUCAP 110* 117* 128* 130* 139* 154*   Imaging Dg Chest Port 1 View  Result Date: 10/28/2016 CLINICAL DATA:  45 year old male on ventilator. Overdose. Subsequent encounter. EXAM: PORTABLE CHEST 1 VIEW COMPARISON:  10/21/2016 FINDINGS: Endotracheal tube tip 2.4 cm above the carina. Left central line tip proximal superior vena cava level. Diffuse slightly asymmetric airspace disease similar to prior exam and may represent neurogenic pulmonary edema although infectious infiltrate not excluded in proper clinical setting. No gross  pneumothorax. Mediastinal and cardiac silhouette unchanged. Nasogastric tube courses below the diaphragm. Tip is not included on the present exam. IMPRESSION: Diffuse slightly asymmetric airspace disease similar to prior exam and may represent neurogenic pulmonary edema although infectious infiltrate not excluded in proper clinical setting. Electronically Signed   By: Lacy Duverney M.D.   On: 10/28/2016 07:59   Dg Abd Portable 1v  Result Date: 10/04/2016 CLINICAL DATA:  Feeding tube placed EXAM: PORTABLE ABDOMEN - 1 VIEW COMPARISON:  10/26/2016 FINDINGS: NG tube tip is in the mid stomach. Gas-filled loops of small and large bowel are not significantly changed. There is no obvious free intraperitoneal gas. IMPRESSION: NG tube tip is in the mid stomach. This is not significantly changed. Additional enteric tubes are not visualized. Electronically Signed   By: Jolaine Click M.D.   On: 10/17/2016 13:09   STUDIES:  Renal u/s 11/24>>> 1. No mass or hydronephrosis of the kidneys.  2. Bilateral pleural effusions and trace perihepatic ascites. 2D echo 11/22>>> EF 60-65%, med-sized vegetation on aortic valve with severe regurg, large vegetation tricuspid valve with mod TR, PA pressure  TEE 11/27 >> Flail aortic valve cusp, severe aortic insufficiency. Opened abscess of the aortic annulus, located anteriorly and to the left, between the ostia of the right and left coronary arteries, without direct involvement of the coronary ostia. Large vegetation on the tricuspid valve with leaflet perforation and severe tricuspid insufficiency. Mildly depressed left ventricular systolic function, EF 45-50%, substantially lower compared to transthoracic echo performed   CULTURES: BC x 2 11/22>>> MSSA   ANTIBIOTICS: Nafcillin 11/23>>>  LINES/TUBES: ETT 11/26>>> L IJ CVL 11/26>>>  DISCUSSION: 45yo male with known IV drug abuse admitted 11/21 with cellulitis and MSSA bacteremia from multi-valve endocarditis now  with respiratory failure requiring intubation.   ASSESSMENT / PLAN:  PULMONARY Acute hypoxemic respiratory failure 2/2 septic emboli from MSSA infective endocarditis, pulm edema P:   Begin PS trials but no extubation given pulmonary condition Responded to diuresis and lasix has been held 11/26 2/2 hypotension.  CT of the chest for ?necrotizing pneumonia Abx as above. ID following.   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 Severe TR and AI. EF 45-50% Hypotension - r/t sepsis and sedation needs and diuresis P:  See ID  Cardiology, CVTS aware. Cards consulted CVTS per note.   RENAL AKI - multifactorial from sepsis and likely septic emboli as well as reduced EF  Metabolic acidosis  P:   Monitor UOP  Follow BMET  Replace electrolytes as indicated Keeping euvolemic. Holding off on diuresis 2/2 hypotension Nephrology consult appreciated  GASTROINTESTINAL Hep C infection  Diarrhea - CDiff neg P:   TF on hold to minimize volume status  HEMATOLOGIC Anemia of chronic disease  P:  F/u  CBC  SQ heparin   INFECTIOUS Infective endocarditis. Septic emboli.  Possible HCAP.  BLE cellulitis - no evidence of abscess R foot CT Repeat Blood Cx NGTD  P:   ID following  Nafcillin  ID following 6-8 weeks IV abx  CVTS following pending Bilateral lower ext x-ray to r/o endocarditis  ENDOCRINE No active issue P:   Monitor CBGs   NEUROLOGIC Sedation needs on vent -- VERY difficult to sedate  Polysubstance abuse, IVDA (shoots fentanyl) P:   RASS goal: -2 Fentanyl and versed gtts  Daily WUA   FAMILY  - Updates:  No family at bedside, family to arrive today for further discussion  - Inter-disciplinary family meet or Palliative Care meeting due by:  12/3  The patient is critically ill with multiple organ systems failure and requires high complexity decision making for assessment and support, frequent evaluation and titration of  therapies, application of advanced monitoring technologies and extensive interpretation of multiple databases.   Critical Care Time devoted to patient care services described in this note is  35  Minutes. This time reflects time of care of this signee Dr Koren BoundWesam Kweli Grassel. This critical care time does not reflect procedure time, or teaching time or supervisory time of PA/NP/Med student/Med Resident etc but could involve care discussion time.  Alyson ReedyWesam G. Shadiamond Koska, M.D. [facility]/Critical Care [facility]. Pager: [phone]. After hours pager: [phone].

## 2016-10-28 NOTE — Care Management Note (Signed)
Case Management Note  Patient Details  Name: Joel Sheppard MRN: [id number] Date of Birth: 09/05/1971  Subjective/Objective:    Pt admitted with infective endocarditis                Action/Plan:  PTA from home - IV drug abuser.  Both tricuspid and bicuspid valve damaged - Surgery consulted.  CSW consulted for placement for long term IV antibiotics.  CM will continue to follow for discharge needs   Expected Discharge Date:                  Expected Discharge Plan:     In-House Referral:  Clinical Social Work  Discharge planning Services  CM Consult  Post Acute Care Choice:    Choice offered to:     DME Arranged:    DME Agency:     HH Arranged:    HH Agency:     Status of Service:  In process, will continue to follow  If discussed at Long Length of Stay Meetings, dates discussed:    Additional Comments:  Cherylann ParrClaxton, [name], RN 10/28/2016, 2:07 PM

## 2016-10-28 NOTE — Progress Notes (Signed)
Patient Name: Joel Sheppard Date of Encounter: 10/28/2016  Primary Cardiologist: [facility] Problem List     Principal Problem:   Cellulitis Active Problems:   Hyperkalemia   Elevated liver enzymes   Acute renal failure (HCC)   Hepatitis C antibody test positive   Tobacco abuse   Hyponatremia   Hyperglycemia   Hypoalbuminemia due to protein-calorie malnutrition (HCC)   Normocytic anemia   Thrombocytopenia (HCC)   IVDU (intravenous drug user)   History of hepatitis C   Staphylococcus aureus bacteremia with sepsis (HCC)   Endocarditis of tricuspid valve   Aortic valve endocarditis   Severe aortic regurgitation   Severe tricuspid regurgitation   Osler's node   Septic embolism (HCC)   Hypoxemia   Bacteremia   Abscess or cellulitis of foot   Acute hypoxemic respiratory failure (HCC)   Acute bacterial endocarditis   Fever     Subjective   Intubated; sedated  Inpatient Medications    Scheduled Meds: . chlorhexidine gluconate (MEDLINE KIT)  15 mL Mouth Rinse BID  . famotidine (PEPCID) IV  20 mg Intravenous Q24H  . heparin subcutaneous  5,000 Units Subcutaneous Q8H  . lanthanum  1,000 mg Oral TID WC  . mouth rinse  15 mL Mouth Rinse QID  . nafcillin IV  2 g Intravenous Q4H  . predniSONE  40 mg Oral Q breakfast  . sodium bicarbonate  1,300 mg Oral TID  . sodium chloride flush  3 mL Intravenous Q12H   Continuous Infusions: . sodium chloride    . feeding supplement (VITAL AF 1.2 CAL) 1,000 mL (10/28/16 0216)  . fentaNYL infusion INTRAVENOUS 200 mcg/hr (10/28/16 0700)  . midazolam (VERSED) infusion 5 mg/hr (10/28/16 0700)  . norepinephrine (LEVOPHED) Adult infusion 6 mcg/min (10/28/16 0700)   PRN Meds: sodium chloride, acetaminophen (TYLENOL) oral liquid 160 mg/5 mL, fentaNYL, midazolam, sodium chloride   Vital Signs    Vitals:   10/28/16 0615 10/28/16 0630 10/28/16 0645 10/28/16 0700  BP: (!) 102/37 (!) 99/40 (!) 99/39 (!) 90/39  Pulse: 98 96 96 95    Resp: _0 Temp:      TempSrc:      SpO2: 98% 97% 98% 97%  Weight:      Height:        Intake/Output Summary (Last 24 hours) at 10/28/16 0731 Last data filed at 10/28/16 0729  Gross per 24 hour  Intake          3641.05 ml  Output              785 ml  Net          2856.05 ml   Filed Weights   10/25/16 2021 10/09/2016 0400 10/28/16 0400  Weight: 165 lb 12.6 oz (75.2 kg) 169 lb 8.5 oz (76.9 kg) 173 lb 15.1 oz (78.9 kg)    Physical Exam    General: ill-appearing man, intubated HEENT: normal Cardiac: tachycardic, 2/6 systolic murmur loudest at RUS border, 3/6 diastolic murmur loudest at left lower sternal border Pulm: diffuse rhonchi Abd: nondistended, no masses Ext: Swelling LLE, wound dressing on bilateral feet, SCDs and mittens in place Neuro: sedated   Labs    CBC  Recent Labs  10/26/2016 0411 10/28/16 0402  WBC 12.4* 16.8*  HGB 7.4* 7.9*  HCT 21.9* 23.7*  MCV 83.3 84.0  PLT 205 604   Basic Metabolic Panel  Recent Labs  10/26/2016 0411 10/16/2016 1646 10/28/16 0403  NA 137  --  136  K 3.9  --  4.2  CL 108  --  108  CO2 19*  --  18*  GLUCOSE 90  --  133*  BUN 134*  --  156*  CREATININE 2.51*  --  3.39*  CALCIUM 7.4*  --  7.4*  MG 2.6* 2.7* 2.8*  PHOS 8.3* 10.1* 9.5*   Liver Function Tests  Recent Labs  10/26/16 1329 10/28/16 0403  AST 39  --   ALT 22  --   ALKPHOS 79  --   BILITOT 3.2*  --   PROT 4.7*  --   ALBUMIN 1.1* 1.0*     Telemetry    Sinus tach, PVCs, NSVT - Personally Reviewed   Radiology    Dg Chest Port 1 View  Result Date: 10/07/2016 CLINICAL DATA:  Respiratory failure. EXAM: PORTABLE CHEST 1 VIEW COMPARISON:  10/26/2016. FINDINGS: Endotracheal tube, NG tube, left IJ line stable position. Heart size stable. Diffuse bilateral pulmonary pulmonary infiltrates are again noted without interval change. Persistent basilar atelectasis. Small left pleural effusion. No pneumothorax. IMPRESSION: 1. Lines and tubes in stable  position.  No pneumothorax. 2. Diffuse bilateral pulmonary infiltrates without interval change. Persistent basilar atelectasis. Small left pleural effusion noted . Electronically Signed   By: Marcello Moores  Register   On: 10/14/2016 06:50   Dg Chest Portable 1 View  Result Date: 10/26/2016 CLINICAL DATA:  Status post intubation and central line placement EXAM: PORTABLE CHEST 1 VIEW COMPARISON:  10/26/2016 FINDINGS: Cardiac shadow is stable. Diffuse bilateral infiltrates are again identified and stable. A left jugular central line is noted with the tip in the proximal superior vena cava. No pneumothorax is seen. Nasogastric catheter is now noted within the stomach. Endotracheal tube is noted at the level of the aortic knob in satisfactory position. IMPRESSION: No change in bilateral infiltrates. Tubes and lines as described without complicating factors. Electronically Signed   By: Inez Catalina M.D.   On: 10/26/2016 13:57   Dg Chest Port 1 View  Result Date: 10/26/2016 CLINICAL DATA:  Dyspnea and left-sided chest pain EXAM: PORTABLE CHEST 1 VIEW COMPARISON:  10/24/2016 FINDINGS: Cardiac shadow is stable. Bilateral infiltrates are again identified and stable. No sizable effusion is seen. No bony abnormality is noted. IMPRESSION: No change in bilateral infiltrates. Electronically Signed   By: Inez Catalina M.D.   On: 10/26/2016 09:08   Dg Abd Portable 1v  Result Date: 10/09/2016 CLINICAL DATA:  Feeding tube placed EXAM: PORTABLE ABDOMEN - 1 VIEW COMPARISON:  10/26/2016 FINDINGS: NG tube tip is in the mid stomach. Gas-filled loops of small and large bowel are not significantly changed. There is no obvious free intraperitoneal gas. IMPRESSION: NG tube tip is in the mid stomach. This is not significantly changed. Additional enteric tubes are not visualized. Electronically Signed   By: Marybelle Killings M.D.   On: 10/07/2016 13:09   Dg Abd Portable 1v  Result Date: 10/26/2016 CLINICAL DATA:  Check gastric catheter  placement EXAM: PORTABLE ABDOMEN - 1 VIEW COMPARISON:  None. FINDINGS: Scattered large and small bowel gas is noted. A nasogastric catheter is noted with the tip in the mid stomach. No other focal abnormality is seen. IMPRESSION: Gastric catheter in the mid stomach. Electronically Signed   By: Inez Catalina M.D.   On: 10/26/2016 13:58    Cardiac Studies   TTE 10/22/2016: - Left ventricle: The cavity size was normal. Wall thickness was   normal. Systolic function was normal. The estimated ejection   fraction  was in the range of 60% to 65%. Wall motion was normal;   there were no regional wall motion abnormalities. - Aortic valve: There was a medium-sized vegetation on the left   ventricular aspect. There was severe regurgitation. Valve area   (VTI): 2.11 cm^2. Valve area (Vmax): 2.01 cm^2. Valve area   (Vmean): 1.92 cm^2. - Tricuspid valve: There was a large vegetation on the right atrial   aspect. There was mild-moderate regurgitation. - Pulmonary arteries: Systolic pressure was mildly increased. PA   peak pressure: 39 mm Hg (S).  Impressions:  - Normal LV function   There is evidence of aortic valve endocarditis and tricuspid   valve endocarditis  Patient Profile     45 y.o. 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  Assessment & Plan    Cardiogenic/Septic shock: Continue levophed and titrate to MAP 60.  Aortic Valve and Tricuspid Valve Endocarditis: TEE with flail aortic valve cusp, abscess, severe AI and large TV vegetation. Blood cultures are growing MSSA for which he is being treated with Nafcillin. I do not think he is a surgical candidate but I have personally discussed with Dr Cyndia Bent and CVTS will consult today.  Prognosis appears extremely poor.  Acute Respiratory Failure: Likely secondary to septic emboli and pulmonary edema, possible HCAP. Intubated 11/26, PCCM managing vent.  Acute Renal Failure: Likely secondary to septic emboli. Nephrology following.   H/O substance abuse  Anemia   Signed, Kirk Ruths, MD  10/28/2016, 7:31 AM

## 2016-10-28 NOTE — Progress Notes (Signed)
Wasted 10cc of versed down the sink with Remonia RichterLindsey Walsh RN

## 2016-10-28 NOTE — Consult Note (Signed)
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 Referring Physician: Dr. Corrie Dandy  Joel Sheppard is an 45 y.o. male.  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 TEE yesterday showed destruction of the aortic valve with severe AI and an aortic annular abscess. There is a large vegetation on the tricuspid valve with severe TR. CXR and CT chest show diffuse airspace disease. He has wounds on the dorsum of both feet draining pus.   Past Medical History:  Diagnosis Date  . Hepatitis C    s/p treatment with Harvoni  . Heroin overdose 09/05/2016  . IV drug abuse     Past Surgical History:  Procedure Laterality Date  . LEG SURGERY      Family History  Problem Relation Age of Onset  . Leukemia Father   . Alzheimer's disease Mother     Social History:  reports that he has been smoking Cigarettes.  He has been smoking about 1.00 pack per day. He has never used smokeless tobacco. He reports that he uses drugs, including Cocaine, IV, and Heroin, about 7 times per week. He reports that he does not drink alcohol.  Allergies: No Known Allergies  Medications:  I have reviewed the patient's current medications. Prior to Admission:  Prescriptions Prior to Admission  Medication Sig Dispense Refill Last Dose  . ibuprofen (ADVIL,MOTRIN) 200 MG tablet Take 200-400 mg by mouth every 6 (six) hours as needed (for pain).   10/14/2016 at unknown time  . naproxen (NAPROSYN) 250 MG tablet Take 250 mg by  mouth 3 (three) times daily as needed for moderate pain.   Past Week at Unknown time  . propranolol (INDERAL) 10 MG tablet Take 10 mg by mouth 3 (three) times daily as needed (anxiety).    over 1 month at unknown time  . QUEtiapine (SEROQUEL) 300 MG tablet Take 300 mg by mouth at bedtime.   over 1 month at unknown time   Scheduled: .  ceFAZolin (ANCEF) IV  2 g Intravenous Q12H  . chlorhexidine gluconate (MEDLINE KIT)  15 mL Mouth Rinse BID  . famotidine (PEPCID) IV  20 mg Intravenous Q24H  . heparin subcutaneous  5,000 Units Subcutaneous Q8H  . hydrocortisone sod succinate (SOLU-CORTEF) inj  50 mg Intravenous Q6H  . insulin aspart  1-3 Units Subcutaneous Q4H  . lanthanum  1,000 mg Oral TID WC  . mouth rinse  15 mL Mouth Rinse QID  . sodium bicarbonate  1,300 mg Oral TID  . sodium chloride flush  3 mL Intravenous Q12H   Continuous: . sodium chloride    . feeding supplement (VITAL AF 1.2 CAL) Stopped (10/28/16 0738)  . fentaNYL infusion INTRAVENOUS 175 mcg/hr (10/28/16 0900)  . midazolam (VERSED) infusion    . norepinephrine (LEVOPHED) Adult infusion 6 mcg/min (10/28/16 1500)   NUU:VOZDGU chloride, acetaminophen (TYLENOL) oral liquid 160 mg/5 mL, fentaNYL, midazolam, sodium chloride Anti-infectives    Start  Dose/Rate Route Frequency Ordered Stop   10/28/16 1600  ceFAZolin (ANCEF) IVPB 2g/100 mL premix     2 g 200 mL/hr over 30 Minutes Intravenous Every 12 hours 10/28/16 1519     10/23/16 1700  nafcillin injection 2 g  Status:  Discontinued     2 g Intravenous Every 4 hours 10/23/16 1617 10/23/16 1655   10/23/16 1700  nafcillin 2 g in dextrose 5 % 100 mL IVPB  Status:  Discontinued     2 g 200 mL/hr over 30 Minutes Intravenous Every 4 hours 10/23/16 1655 10/28/16 1519   10/22/16 2359  vancomycin (VANCOCIN) IVPB 750 mg/150 ml premix  Status:  Discontinued     750 mg 150 mL/hr over 60 Minutes Intravenous Every 24 hours 10/22/16 0336 10/22/16 2045   10/22/16 2200  ceFAZolin (ANCEF)  IVPB 2g/100 mL premix  Status:  Discontinued     2 g 200 mL/hr over 30 Minutes Intravenous Every 12 hours 10/22/16 2105 10/23/16 1617   10/22/16 0800  piperacillin-tazobactam (ZOSYN) IVPB 2.25 g  Status:  Discontinued     2.25 g 100 mL/hr over 30 Minutes Intravenous Every 6 hours 10/22/16 0336 10/22/16 0505   10/22/16 0800  piperacillin-tazobactam (ZOSYN) IVPB 3.375 g  Status:  Discontinued     3.375 g 12.5 mL/hr over 240 Minutes Intravenous Every 8 hours 10/22/16 0505 10/22/16 2045   10/22/16 0315  piperacillin-tazobactam (ZOSYN) IVPB 3.375 g  Status:  Discontinued     3.375 g 100 mL/hr over 30 Minutes Intravenous  Once 10/22/16 0302 10/22/16 0304   10/22/16 0315  vancomycin (VANCOCIN) IVPB 1000 mg/200 mL premix  Status:  Discontinued     1,000 mg 200 mL/hr over 60 Minutes Intravenous  Once 10/22/16 0302 10/22/16 0304   10/20/2016 2330  piperacillin-tazobactam (ZOSYN) IVPB 3.375 g     3.375 g 12.5 mL/hr over 240 Minutes Intravenous  Once 10/12/2016 2329 10/22/16 0444   10/28/2016 2330  vancomycin (VANCOCIN) IVPB 1000 mg/200 mL premix     1,000 mg 200 mL/hr over 60 Minutes Intravenous  Once 10/13/2016 2329 10/22/16 0148      Results for orders placed or performed during the hospital encounter of 10/06/2016 (from the past 48 hour(s))  Glucose, capillary     Status: Abnormal   Collection Time: 10/26/16  5:26 PM  Result Value Ref Range   Glucose-Capillary 141 (H) 65 - 99 mg/dL   Comment 1 Notify RN   Magnesium     Status: Abnormal   Collection Time: 10/26/16  6:17 PM  Result Value Ref Range   Magnesium 2.7 (H) 1.7 - 2.4 mg/dL  Phosphorus     Status: Abnormal   Collection Time: 10/26/16  6:17 PM  Result Value Ref Range   Phosphorus 7.2 (H) 2.5 - 4.6 mg/dL  Glucose, capillary     Status: Abnormal   Collection Time: 10/26/16  8:14 PM  Result Value Ref Range   Glucose-Capillary 119 (H) 65 - 99 mg/dL   Comment 1 Notify RN   Glucose, capillary     Status: None   Collection Time: 10/26/16  11:49 PM  Result Value Ref Range   Glucose-Capillary 96 65 - 99 mg/dL   Comment 1 Notify RN   CBC     Status: Abnormal   Collection Time: 10/16/2016  4:11 AM  Result Value Ref Range   WBC 12.4 (H) 4.0 - 10.5 K/uL   RBC 2.63 (L) 4.22 - 5.81 MIL/uL   Hemoglobin 7.4 (L) 13.0 -  17.0 g/dL   HCT 21.9 (L) 39.0 - 52.0 %   MCV 83.3 78.0 - 100.0 fL   MCH 28.1 26.0 - 34.0 pg   MCHC 33.8 30.0 - 36.0 g/dL   RDW 15.8 (H) 11.5 - 15.5 %   Platelets 205 150 - 400 K/uL  Basic metabolic panel     Status: Abnormal   Collection Time: 10/20/2016  4:11 AM  Result Value Ref Range   Sodium 137 135 - 145 mmol/L   Potassium 3.9 3.5 - 5.1 mmol/L   Chloride 108 101 - 111 mmol/L   CO2 19 (L) 22 - 32 mmol/L   Glucose, Bld 90 65 - 99 mg/dL   BUN 134 (H) 6 - 20 mg/dL   Creatinine, Ser 2.51 (H) 0.61 - 1.24 mg/dL   Calcium 7.4 (L) 8.9 - 10.3 mg/dL   GFR calc non Af Amer 29 (L) >60 mL/min   GFR calc Af Amer 34 (L) >60 mL/min    Comment: (NOTE) The eGFR has been calculated using the CKD EPI equation. This calculation has not been validated in all clinical situations. eGFR's persistently <60 mL/min signify possible Chronic Kidney Disease.    Anion gap 10 5 - 15  Magnesium     Status: Abnormal   Collection Time: 10/13/2016  4:11 AM  Result Value Ref Range   Magnesium 2.6 (H) 1.7 - 2.4 mg/dL  Phosphorus     Status: Abnormal   Collection Time: 10/23/2016  4:11 AM  Result Value Ref Range   Phosphorus 8.3 (H) 2.5 - 4.6 mg/dL  Glucose, capillary     Status: None   Collection Time: 10/26/2016  4:22 AM  Result Value Ref Range   Glucose-Capillary 90 65 - 99 mg/dL  Glucose, capillary     Status: None   Collection Time: 10/20/2016  7:36 AM  Result Value Ref Range   Glucose-Capillary 97 65 - 99 mg/dL   Comment 1 Notify RN    Comment 2 Document in Chart   Creatinine, urine, random     Status: None   Collection Time: 10/26/2016  7:42 AM  Result Value Ref Range   Creatinine, Urine 153.00 mg/dL  Sodium, urine, random      Status: None   Collection Time: 10/26/2016  7:42 AM  Result Value Ref Range   Sodium, Ur <10 mmol/L    Comment: RESULT REPEATED AND VERIFIED  Glucose, capillary     Status: Abnormal   Collection Time: 10/26/2016 12:13 PM  Result Value Ref Range   Glucose-Capillary 110 (H) 65 - 99 mg/dL   Comment 1 Notify RN    Comment 2 Document in Chart   Glucose, capillary     Status: Abnormal   Collection Time: 10/19/2016  3:30 PM  Result Value Ref Range   Glucose-Capillary 117 (H) 65 - 99 mg/dL   Comment 1 Document in Chart   Magnesium     Status: Abnormal   Collection Time: 10/26/2016  4:46 PM  Result Value Ref Range   Magnesium 2.7 (H) 1.7 - 2.4 mg/dL  Phosphorus     Status: Abnormal   Collection Time: 10/22/2016  4:46 PM  Result Value Ref Range   Phosphorus 10.1 (H) 2.5 - 4.6 mg/dL  Glucose, capillary     Status: Abnormal   Collection Time: 10/26/2016  7:43 PM  Result Value Ref Range   Glucose-Capillary 128 (H) 65 - 99 mg/dL   Comment 1 Document in Chart   Glucose, capillary  Status: Abnormal   Collection Time: 10/01/2016 11:23 PM  Result Value Ref Range   Glucose-Capillary 130 (H) 65 - 99 mg/dL   Comment 1 Document in Chart   Glucose, capillary     Status: Abnormal   Collection Time: 10/28/16  3:54 AM  Result Value Ref Range   Glucose-Capillary 139 (H) 65 - 99 mg/dL   Comment 1 Document in Chart   CBC     Status: Abnormal   Collection Time: 10/28/16  4:02 AM  Result Value Ref Range   WBC 16.8 (H) 4.0 - 10.5 K/uL   RBC 2.82 (L) 4.22 - 5.81 MIL/uL   Hemoglobin 7.9 (L) 13.0 - 17.0 g/dL   HCT 23.7 (L) 39.0 - 52.0 %   MCV 84.0 78.0 - 100.0 fL   MCH 28.0 26.0 - 34.0 pg   MCHC 33.3 30.0 - 36.0 g/dL   RDW 16.2 (H) 11.5 - 15.5 %   Platelets 368 150 - 400 K/uL  Renal function panel     Status: Abnormal   Collection Time: 10/28/16  4:03 AM  Result Value Ref Range   Sodium 136 135 - 145 mmol/L   Potassium 4.2 3.5 - 5.1 mmol/L   Chloride 108 101 - 111 mmol/L   CO2 18 (L) 22 - 32 mmol/L    Glucose, Bld 133 (H) 65 - 99 mg/dL   BUN 156 (H) 6 - 20 mg/dL   Creatinine, Ser 3.39 (H) 0.61 - 1.24 mg/dL   Calcium 7.4 (L) 8.9 - 10.3 mg/dL   Phosphorus 9.5 (H) 2.5 - 4.6 mg/dL   Albumin 1.0 (L) 3.5 - 5.0 g/dL   GFR calc non Af Amer 20 (L) >60 mL/min   GFR calc Af Amer 24 (L) >60 mL/min    Comment: (NOTE) The eGFR has been calculated using the CKD EPI equation. This calculation has not been validated in all clinical situations. eGFR's persistently <60 mL/min signify possible Chronic Kidney Disease.    Anion gap 10 5 - 15  Magnesium     Status: Abnormal   Collection Time: 10/28/16  4:03 AM  Result Value Ref Range   Magnesium 2.8 (H) 1.7 - 2.4 mg/dL  Glucose, capillary     Status: Abnormal   Collection Time: 10/28/16  7:53 AM  Result Value Ref Range   Glucose-Capillary 154 (H) 65 - 99 mg/dL   Comment 1 Notify RN    Comment 2 Document in Chart   Cortisol     Status: None   Collection Time: 10/28/16  9:30 AM  Result Value Ref Range   Cortisol, Plasma 8.3 ug/dL    Comment: (NOTE) AM    6.7 - 22.6 ug/dL PM   <10.0       ug/dL   Blood gas, arterial     Status: Abnormal   Collection Time: 10/28/16  9:30 AM  Result Value Ref Range   FIO2 40.00    Delivery systems VENTILATOR    Mode PRESSURE SUPPORT    Peep/cpap 5.0 cm H20   Pressure support 5.0 cm H20   pH, Arterial 7.339 (L) 7.350 - 7.450   pCO2 arterial 30.2 (L) 32.0 - 48.0 mmHg   pO2, Arterial 95.2 83.0 - 108.0 mmHg   Bicarbonate 15.8 (L) 20.0 - 28.0 mmol/L   Acid-base deficit 8.8 (H) 0.0 - 2.0 mmol/L   O2 Saturation 97.6 %   Patient temperature 98.6    Collection site RIGHT RADIAL    Drawn by [id number]  Sample type ARTERIAL DRAW    Allens test (pass/fail) PASS PASS  Glucose, capillary     Status: Abnormal   Collection Time: 10/28/16 11:57 AM  Result Value Ref Range   Glucose-Capillary 144 (H) 65 - 99 mg/dL   Comment 1 Notify RN    Comment 2 Document in Chart     Dg Chest Port 1 View  Result Date:  10/28/2016 CLINICAL DATA:  45 year old male on ventilator. Overdose. Subsequent encounter. EXAM: PORTABLE CHEST 1 VIEW COMPARISON:  10/08/2016 FINDINGS: Endotracheal tube tip 2.4 cm above the carina. Left central line tip proximal superior vena cava level. Diffuse slightly asymmetric airspace disease similar to prior exam and may represent neurogenic pulmonary edema although infectious infiltrate not excluded in proper clinical setting. No gross pneumothorax. Mediastinal and cardiac silhouette unchanged. Nasogastric tube courses below the diaphragm. Tip is not included on the present exam. IMPRESSION: Diffuse slightly asymmetric airspace disease similar to prior exam and may represent neurogenic pulmonary edema although infectious infiltrate not excluded in proper clinical setting. Electronically Signed   By: Genia Del M.D.   On: 10/28/2016 07:59   Dg Chest Port 1 View  Result Date: 10/09/2016 CLINICAL DATA:  Respiratory failure. EXAM: PORTABLE CHEST 1 VIEW COMPARISON:  10/26/2016. FINDINGS: Endotracheal tube, NG tube, left IJ line stable position. Heart size stable. Diffuse bilateral pulmonary pulmonary infiltrates are again noted without interval change. Persistent basilar atelectasis. Small left pleural effusion. No pneumothorax. IMPRESSION: 1. Lines and tubes in stable position.  No pneumothorax. 2. Diffuse bilateral pulmonary infiltrates without interval change. Persistent basilar atelectasis. Small left pleural effusion noted . Electronically Signed   By: Marcello Moores  Register   On: 10/13/2016 06:50   Dg Abd Portable 1v  Result Date: 10/14/2016 CLINICAL DATA:  Feeding tube placed EXAM: PORTABLE ABDOMEN - 1 VIEW COMPARISON:  10/26/2016 FINDINGS: NG tube tip is in the mid stomach. Gas-filled loops of small and large bowel are not significantly changed. There is no obvious free intraperitoneal gas. IMPRESSION: NG tube tip is in the mid stomach. This is not significantly changed. Additional  enteric tubes are not visualized. Electronically Signed   By: Marybelle Killings M.D.   On: 10/05/2016 13:09   Dg Foot Complete Left  Result Date: 10/28/2016 CLINICAL DATA:  Bilateral feet ulcers. EXAM: LEFT FOOT - COMPLETE 3+ VIEW COMPARISON:  Radiographs of October 21, 2016. FINDINGS: There is no evidence of fracture or dislocation. There is no evidence of arthropathy or other focal bone abnormality. Soft tissues are unremarkable. IMPRESSION: No significant abnormality seen in the left foot. Electronically Signed   By: Marijo Conception, M.D.   On: 10/28/2016 10:08   Dg Foot Complete Right  Result Date: 10/28/2016 CLINICAL DATA:  Bilateral foot ulcers. EXAM: RIGHT FOOT COMPLETE - 3+ VIEW COMPARISON:  Radiographs of October 21, 2016. FINDINGS: There is no evidence of fracture or dislocation. There is no evidence of arthropathy or other focal bone abnormality. Soft tissues are unremarkable. IMPRESSION: Normal right foot. Electronically Signed   By: Marijo Conception, M.D.   On: 10/28/2016 10:11    Review of Systems  Unable to perform ROS: Intubated   Blood pressure (!) 115/49, pulse 92, temperature (!) 96.8 F (36 C), temperature source Axillary, resp. rate 17, height 5' 9"  (1.753 m), weight 78.9 kg (173 lb 15.1 oz), SpO2 99 %. Physical Exam  Constitutional:  Thin, chronically ill-appearing gentleman, intubated and sedated  Neck: JVD present.  Cardiovascular: Normal rate and intact distal pulses.  Murmur heard. 3/6 systolic and diastolic murmur heard throughout precordium.  Respiratory:  Coarse breath sounds  GI: Soft. He exhibits no distension.  Musculoskeletal: He exhibits no edema.  Skin:  Dressings on dorsum of feet not removed               *[facility]*                         [address], [place] [address]                             [phone]  ------------------------------------------------------------------- Transesophageal Echocardiography  Patient:    Thayne, Cindric MR #:       [id number] Study Date: 10/18/2016 Gender:     M Age:        22 Height:     175.3 cm Weight:     76.9 kg BSA:        1.94 m^2 Pt. Status: Room:       2M06C   ADMITTING    Rama, Christina P  PERFORMING   Sanda Klein, MD  SONOGRAPHER  Johny Chess, RDCS, CCT  Hermine Messick  ATTENDING    [facility], [facility] A  cc:  ------------------------------------------------------------------- LV EF: 45% -   50%  ------------------------------------------------------------------- Indications:      Endocarditis 421.9.  ------------------------------------------------------------------- Study Conclusions  - Left ventricle: The cavity size was normal. Wall thickness was   normal. Systolic function was mildly reduced. The estimated   ejection fraction was in the range of 45% to 50%. Mild diffuse   hypokinesis with no identifiable regional variations. - Aortic valve: There is a medium size abscess of the aortic   annulus, communicating with the aortic root (see below). The   right coronary cusp is ruptured and flail. To a lesser degree,   there is a flail portion of the tip of the left coronary cusp.   There was a vegetation. There was wide-open regurgitation. - Mitral valve: No evidence of vegetation. - Left atrium: No evidence of thrombus in the atrial cavity or   appendage. - Right atrium: No evidence of thrombus in the atrial cavity or   appendage. - Atrial septum: No defect or patent foramen ovale was identified. - Tricuspid valve: There was a large, highly mobile vegetation of   the anterior leaflet. There was severe regurgitation directed   centrally. - Pulmonic valve: No evidence of vegetation.  ------------------------------------------------------------------- Study data:   Study status:   Routine.  Consent:  Informed consent could not be obtained; a medical necessity note was provided. Procedure:  Initial setup. The patient was brought to the laboratory. Surface ECG leads were monitored. Sedation. Conscious sedation was administered by cardiology staff. Transesophageal echocardiography. A transesophageal probe was inserted by the attending cardiologistwithout difficulty. Image quality was adequate.  Study completion:  The patient tolerated the procedure well. There were no complications.          Diagnostic transesophageal echocardiography.  2D and color Doppler. Birthdate:  Patient birthdate: 1971/08/27.  Age:  Patient is 45 yr old.  Sex:  Gender: male.  BMI: 25 kg/m^2.  Blood pressure: 97/35  Patient status:  Inpatient.  Study date:  Study date: 10/08/2016. Study time: 12:03 PM.  Location:  Bedside.  -------------------------------------------------------------------  ------------------------------------------------------------------- Left ventricle:  The cavity size was normal. Wall thickness was normal. Systolic function was mildly reduced. The estimated ejection fraction was in the range of 45% to 50%.  Mild diffuse hypokinesis with no identifiable regional variations.  ------------------------------------------------------------------- Aortic valve:   Trileaflet. There is a medium size (2.2 cm wide, 9 mm deep, extending in the aortic root up to13 mm distal from the annulus) abscess of the aortic annulus, communicating with the aortic root. It undermines the right and left coronary cusps. It approaches, but does not directly encroach upon the right and left coronary ostia. The right coronary cusp is ruptured and flail. To a lesser degree, there is a flail portion of the tip of the left coronary cusp. There was a vegetation.  Doppler:  There was wide-open regurgitation. Severe regurgitation is suggested by a regurgitant jet width >= 60% of the LVOT diameter,  an aortic regurgitation PHT <= 563 ms, holodiastolic flow reversal in the descending aorta, and a vena contracta >= 6 mm.  ------------------------------------------------------------------- Aorta:  The aortic root has an abscess described above. The remainder of the aorta was normal, not dilated, and non-diseased, but was markedly hyperpulsatile.  ------------------------------------------------------------------- Mitral valve:   Structurally normal valve.   Leaflet separation was normal.  No evidence of vegetation.  Doppler:  There was trivial regurgitation.  ------------------------------------------------------------------- Left atrium:  The atrium was normal in size.  No evidence of thrombus in the atrial cavity or appendage.  ------------------------------------------------------------------- Atrial septum:  No defect or patent foramen ovale was identified.   ------------------------------------------------------------------- Right ventricle:  The cavity size was normal. Wall thickness was normal. Systolic function was normal.  ------------------------------------------------------------------- Pulmonic valve:    Structurally normal valve.   Cusp separation was normal.  No evidence of vegetation.  ------------------------------------------------------------------- Tricuspid valve:  There was a large, highly mobile vegetation of the anterior leaflet. There is aneurysmal change and perforation of the leaflet.  Doppler:  There was severe regurgitation directed centrally.  ------------------------------------------------------------------- Right atrium:  The atrium was normal in size.  No evidence of thrombus in the atrial cavity or appendage.  ------------------------------------------------------------------- Pericardium:  There was no pericardial effusion.   ------------------------------------------------------------------- Post procedure  conclusions Ascending Aorta:  - The aortic root has an abscess described above. The remainder of   the aorta was normal, not dilated, and non-diseased, but was   markedly hyperpulsatile.  ------------------------------------------------------------------- Measurements   Aortic valve                          Value  Aortic regurg pressure half-time      93    ms  Legend: (L)  and  (H)  mark values outside specified reference range.  ------------------------------------------------------------------- Prepared and Electronically Authenticated by  Sanda Klein, MD 2017-11-27T14:16:36   Assessment/Plan:  I have personally reviewed his chart, TEE images and examined the patient. He has aortic and tricuspid valve endocarditis due to MSSA from IVDA. The aortic valve is destroyed with an annular/root abscess. He has wide open AI with probable cardiogenic shock requiring levophed. He has vent dependent respiratory failure with diffuse pulmonary densities that are due to edema, possible some septic emboli although the diffuseness looks more like edema/ARDS. He has progressive renal failure, an albumin of 1.0, open wounds on his feet draining pus  and persistently positive blood cultures. I would not consider him to be an operative candidate and his prognosis is grim. I would try to establish a no code status with the family because this is a lethal problem.  Gaye Pollack 10/28/2016, 3:26 PM

## 2016-10-28 NOTE — Progress Notes (Signed)
Subjective: Interval History: on vent, on pressors.  Objective: Vital signs in last 24 hours: Temp:  [97.5 F (36.4 C)-102.1 F (38.9 C)] 102.1 F (38.9 C) (11/28 0300) Pulse Rate:  [85-117] 95 (11/28 0700) Resp:  [15-37] 17 (11/28 0700) BP: (89-122)/(29-57) 90/39 (11/28 0700) SpO2:  [93 %-100 %] 97 % (11/28 0700) FiO2 (%):  [40 %] 40 % (11/28 0600) Weight:  [78.9 kg (173 lb 15.1 oz)] 78.9 kg (173 lb 15.1 oz) (11/28 0400) Weight change: 2 kg (4 lb 6.6 oz)  Intake/Output from previous day: 11/27 0701 - 11/28 0700 In: 3441.1 [I.V.:1381.1; NG/GT:1510; IV Piggyback:550] Out: 785 [Urine:785] Intake/Output this shift: No intake/output data recorded.  General appearance: opens eyes but sedated, on vent Resp: rales bibasilar and rhonchi bibasilar Cardio: S1, S2 normal, systolic murmur: systolic ejection 2/6, decrescendo at 2nd left intercostal space, 2nd systolic murmur: holosystolic 2/6, blowing at lower left sternal border and diastolic murmur: early diastolic 2/6, decrescendo at 2nd left intercostal space GI: pos bs soft, liver down 5 cm Extremities: edema 3+, also lesions on legs.  Lab Results:  Recent Labs  10/04/2016 0411 10/28/16 0402  WBC 12.4* 16.8*  HGB 7.4* 7.9*  HCT 21.9* 23.7*  PLT 205 368   BMET:  Recent Labs  10/03/2016 0411 10/28/16 0403  NA 137 136  K 3.9 4.2  CL 108 108  CO2 19* 18*  GLUCOSE 90 133*  BUN 134* 156*  CREATININE 2.51* 3.39*  CALCIUM 7.4* 7.4*   No results for input(s): PTH in the last 72 hours. Iron Studies: No results for input(s): IRON, TIBC, TRANSFERRIN, FERRITIN in the last 72 hours.  Studies/Results: Dg Chest Port 1 View  Result Date: 10/10/2016 CLINICAL DATA:  Respiratory failure. EXAM: PORTABLE CHEST 1 VIEW COMPARISON:  10/26/2016. FINDINGS: Endotracheal tube, NG tube, left IJ line stable position. Heart size stable. Diffuse bilateral pulmonary pulmonary infiltrates are again noted without interval change. Persistent basilar  atelectasis. Small left pleural effusion. No pneumothorax. IMPRESSION: 1. Lines and tubes in stable position.  No pneumothorax. 2. Diffuse bilateral pulmonary infiltrates without interval change. Persistent basilar atelectasis. Small left pleural effusion noted . Electronically Signed   By: Maisie Fushomas  Register   On: 10/26/2016 06:50   Dg Chest Portable 1 View  Result Date: 10/26/2016 CLINICAL DATA:  Status post intubation and central line placement EXAM: PORTABLE CHEST 1 VIEW COMPARISON:  10/26/2016 FINDINGS: Cardiac shadow is stable. Diffuse bilateral infiltrates are again identified and stable. A left jugular central line is noted with the tip in the proximal superior vena cava. No pneumothorax is seen. Nasogastric catheter is now noted within the stomach. Endotracheal tube is noted at the level of the aortic knob in satisfactory position. IMPRESSION: No change in bilateral infiltrates. Tubes and lines as described without complicating factors. Electronically Signed   By: Alcide CleverMark  Lukens M.D.   On: 10/26/2016 13:57   Dg Chest Port 1 View  Result Date: 10/26/2016 CLINICAL DATA:  Dyspnea and left-sided chest pain EXAM: PORTABLE CHEST 1 VIEW COMPARISON:  10/24/2016 FINDINGS: Cardiac shadow is stable. Bilateral infiltrates are again identified and stable. No sizable effusion is seen. No bony abnormality is noted. IMPRESSION: No change in bilateral infiltrates. Electronically Signed   By: Alcide CleverMark  Lukens M.D.   On: 10/26/2016 09:08   Dg Abd Portable 1v  Result Date: 10/26/2016 CLINICAL DATA:  Feeding tube placed EXAM: PORTABLE ABDOMEN - 1 VIEW COMPARISON:  10/26/2016 FINDINGS: NG tube tip is in the mid stomach. Gas-filled loops of small and  large bowel are not significantly changed. There is no obvious free intraperitoneal gas. IMPRESSION: NG tube tip is in the mid stomach. This is not significantly changed. Additional enteric tubes are not visualized. Electronically Signed   By: Jolaine ClickArthur  Hoss M.D.   On:  10/07/2016 13:09   Dg Abd Portable 1v  Result Date: 10/26/2016 CLINICAL DATA:  Check gastric catheter placement EXAM: PORTABLE ABDOMEN - 1 VIEW COMPARISON:  None. FINDINGS: Scattered large and small bowel gas is noted. A nasogastric catheter is noted with the tip in the mid stomach. No other focal abnormality is seen. IMPRESSION: Gastric catheter in the mid stomach. Electronically Signed   By: Alcide CleverMark  Lukens M.D.   On: 10/26/2016 13:58    I have reviewed the patient's current medications.  Assessment/Plan: 1 AKI embolic, vs GN vs hemodynamic.  Worsening GFR and azotemia.  Not good candidate for RRT because of entire situation but if to do ,need to start.  Has xs vol in 2 Low bps on pressors 3 Endocarditis on AB  Worsening status 4 Substance abuse as etiology 5 anemia 6 Nutrition on TF need to limit if cont or not P need decision on course, if cont, needs CRRT.  Limit vol    LOS: 7 days   Dhara Schepp,Kermit L 10/28/2016,7:25 AM

## 2016-10-28 NOTE — Progress Notes (Signed)
Pt transported on vent to CT and back to 2M06. Patient's vitals remained stable throughout.

## 2016-10-28 NOTE — Progress Notes (Signed)
PULMONARY / CRITICAL CARE MEDICINE   Name: Nathaniel ManJames S Koch MRN: [id number] DOB: 05/20/1971    ADMISSION DATE:  10/23/2016 CONSULTATION DATE:  11/26  REFERRING MD:  Rito EhrlichKrishnan ([facility])   CHIEF COMPLAINT:  Respiratory distress   BRIEF:  45yo male with hx Hep C, IVDA with recent hospitalization for heroin overdose initially admitted 11/21 with L foot and R tibial cellulitis.  He was found to have MSSA bacteremia with both R and L sided endocarditis and severe Aortic regurg and severe TR with significant sized vegetations on both valves as well as concern for septic emboli to the kidneys.  Has been on IV abx, followed by ID with ongoing positive blood cultures.  On 11/26 had progressive respiratory distress and requiring tx to ICU and urgent intubation with concern for septic pulmonary emboli.    SIGNIFICANT EVENTS: 11/21: admitted for cellulitis and MSSA bacteremia  11/26: progressive respiratory distress, intubated   SUBJECTIVE:  Agitated overnight. Still requiring Levophed for pressure support.   VITAL SIGNS: BP (!) 102/37   Pulse 98   Temp (!) 102.1 F (38.9 C) (Oral)   Resp 20   Ht 5\' 9"  (1.753 m)   Wt 173 lb 15.1 oz (78.9 kg)   SpO2 98%   BMI 25.69 kg/m   HEMODYNAMICS: CVP:  [9 mmHg-12 mmHg] 12 mmHg  VENTILATOR SETTINGS: Vent Mode: PRVC FiO2 (%):  [40 %] 40 % Set Rate:  [20 bmp] 20 bmp Vt Set:  [570 mL] 570 mL PEEP:  [5 cmH20] 5 cmH20 Pressure Support:  [5 cmH20] 5 cmH20 Plateau Pressure:  [16 cmH20-26 cmH20] 26 cmH20  INTAKE / OUTPUT: I/O last 3 completed shifts: In: 3410.8 [I.V.:2025.8; NG/GT:585; IV Piggyback:800] Out: 990 [Urine:990]  PHYSICAL EXAMINATION: General:  Young male, sedated, intubated.  Neuro:  Sedated, intubated. Moves in response to pain.  HEENT:   ETT  Cardiovascular:  S1s2. Holosystolic murmur.  Lungs:  Good air movement. Rhonchi at bases.  Abdomen:  Soft, +bs  Musculoskeletal:  Warm and dry, Gr 1 edema  LABS:  BMET  Recent Labs Lab  10/26/16 1329 May 11, 2016 0411 10/28/16 0403  NA 135 137 136  K 3.6 3.9 4.2  CL 107 108 108  CO2 19* 19* 18*  BUN 116* 134* 156*  CREATININE 2.12* 2.51* 3.39*  GLUCOSE 135* 90 133*    Electrolytes  Recent Labs Lab 10/26/16 1329  May 11, 2016 0411 May 11, 2016 1646 10/28/16 0403  CALCIUM 6.8*  --  7.4*  --  7.4*  MG 2.5*  < > 2.6* 2.7* 2.8*  PHOS 6.8*  < > 8.3* 10.1* 9.5*  < > = values in this interval not displayed.  CBC  Recent Labs Lab 10/26/16 1329 May 11, 2016 0411 10/28/16 0402  WBC 11.4* 12.4* 16.8*  HGB 7.3* 7.4* 7.9*  HCT 21.5* 21.9* 23.7*  PLT 182 205 368    Coag's  Recent Labs Lab 10/22/16 0527 10/26/16 1329  APTT  --  32  INR 1.33 1.92    Sepsis Markers  Recent Labs Lab 10/04/2016 2105 10/22/16 0038 10/26/16 1330  LATICACIDVEN 1.71 1.31 2.8*    ABG  Recent Labs Lab 10/24/16 1412 10/26/16 1337  PHART 7.405 7.335*  PCO2ART 25.0* 36.5  PO2ART 70.0* 45.7*    Liver Enzymes  Recent Labs Lab 10/24/16 0349 10/25/16 0513 10/26/16 1329 10/28/16 0403  AST 65* 68* 39  --   ALT 41 39 22  --   ALKPHOS 106 110 79  --   BILITOT 1.9* 4.0* 3.2*  --  ALBUMIN 1.2* 1.3* 1.1* 1.0*    Cardiac Enzymes No results for input(s): TROPONINI, PROBNP in the last 168 hours.  Glucose  Recent Labs Lab 10/29/2016 0736 10/29/2016 1213 10/02/2016 1530 10/05/2016 1943 10/28/2016 2323 10/28/16 0354  GLUCAP 97 110* 117* 128* 130* 139*    Imaging Dg Abd Portable 1v  Result Date: 10/04/2016 CLINICAL DATA:  Feeding tube placed EXAM: PORTABLE ABDOMEN - 1 VIEW COMPARISON:  10/26/2016 FINDINGS: NG tube tip is in the mid stomach. Gas-filled loops of small and large bowel are not significantly changed. There is no obvious free intraperitoneal gas. IMPRESSION: NG tube tip is in the mid stomach. This is not significantly changed. Additional enteric tubes are not visualized. Electronically Signed   By: Jolaine Click M.D.   On: 10/21/2016 13:09     STUDIES:  Renal u/s  11/24>>> 1. No mass or hydronephrosis of the kidneys.  2. Bilateral pleural effusions and trace perihepatic ascites. 2D echo 11/22>>> EF 60-65%, med-sized vegetation on aortic valve with severe regurg, large vegetation tricuspid valve with mod TR, PA pressure  TEE 11/27 >> Flail aortic valve cusp, severe aortic insufficiency. Opened abscess of the aortic annulus, located anteriorly and to the left, between the ostia of the right and left coronary arteries, without direct involvement of the coronary ostia. Large vegetation on the tricuspid valve with leaflet perforation and severe tricuspid insufficiency. Mildly depressed left ventricular systolic function, EF 45-50%, substantially lower compared to transthoracic echo performed    CULTURES: BC x 2 11/22>>> MSSA   ANTIBIOTICS: Nafcillin 11/23>>>   LINES/TUBES: ETT 11/26>>> L IJ CVL 11/26>>>  DISCUSSION: 45yo male with known IV drug abuse admitted 11/21 with cellulitis and MSSA bacteremia from multi-valve endocarditis now with respiratory failure requiring intubation.   ASSESSMENT / PLAN:  PULMONARY Acute hypoxemic respiratory failure 2/2 septic emboli from MSSA infective endocarditis, pulm edema P:   Vent support - 8cc/kg. No weaning.  Responded to diuresis and lasix has been held 11/26 2/2 hypotension.  Consider CT chest once more stable  abx as above. ID following.    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 Severe TR and AI. EF 45-50% Hypotension - r/t sepsis and sedation needs and diuresis P:  See ID  Cardiology, CVTS aware. Cards consulted CVTS per note.   RENAL AKI - multifactorial from sepsis and likely septic emboli as well as reduced EF  Metabolic acidosis  P:   Monitor UOP  Follow BMET  Keeping euvolemic. Holding off on diuresis 2/2 hypotension   GASTROINTESTINAL Hep C infection  Diarrhea - CDiff neg P:   TF    HEMATOLOGIC Anemia of chronic disease   P:  F/u CBC  SQ heparin   INFECTIOUS Infective endocarditis. Septic emboli.  Possible HCAP.  BLE cellulitis - no evidence of abscess R foot CT Repeat Blood Cx NGTD  P:   ID following  Nafcillin  ?Addition of MRSA coverage given report of purulent drainage from [facility]  6-8 weeks IV abx  CVTS following   ENDOCRINE No active issue P:   Monitor CBGs   NEUROLOGIC Sedation needs on vent -- VERY difficult to sedate  Polysubstance abuse, IVDA (shoots fentanyl) P:   RASS goal: -2 Fentanyl and versed gtts  Daily WUA    FAMILY  - Updates:  No family at bedside   - Inter-disciplinary family meet or Palliative Care meeting due by:  12/3   Marcy Siren, D.O. 10/28/2016, 6:50 AM PGY-2, [facility]  Alyson ReedyWesam G. Jalaila Caradonna, M.D. [facility] Pulmonary[facility] Medicine. Pager: [phone]. After hours pager: [phone].\

## 2016-10-29 ENCOUNTER — Inpatient Hospital Stay (HOSPITAL_COMMUNITY): Payer: Medicaid Other

## 2016-10-29 DIAGNOSIS — A419 Sepsis, unspecified organism: Secondary | ICD-10-CM

## 2016-10-29 DIAGNOSIS — R6521 Severe sepsis with septic shock: Secondary | ICD-10-CM

## 2016-10-29 DIAGNOSIS — I7789 Other specified disorders of arteries and arterioles: Secondary | ICD-10-CM

## 2016-10-29 DIAGNOSIS — B9561 Methicillin susceptible Staphylococcus aureus infection as the cause of diseases classified elsewhere: Secondary | ICD-10-CM

## 2016-10-29 LAB — CULTURE, BLOOD (ROUTINE X 2): CULTURE: NO GROWTH

## 2016-10-29 LAB — CBC
HEMATOCRIT: 23.8 % — AB (ref 39.0–52.0)
HEMOGLOBIN: 7.9 g/dL — AB (ref 13.0–17.0)
MCH: 28 pg (ref 26.0–34.0)
MCHC: 33.2 g/dL (ref 30.0–36.0)
MCV: 84.4 fL (ref 78.0–100.0)
Platelets: 401 10*3/uL — ABNORMAL HIGH (ref 150–400)
RBC: 2.82 MIL/uL — ABNORMAL LOW (ref 4.22–5.81)
RDW: 16.6 % — AB (ref 11.5–15.5)
WBC: 12.3 10*3/uL — AB (ref 4.0–10.5)

## 2016-10-29 LAB — MAGNESIUM: MAGNESIUM: 3 mg/dL — AB (ref 1.7–2.4)

## 2016-10-29 LAB — GLUCOSE, CAPILLARY
GLUCOSE-CAPILLARY: 149 mg/dL — AB (ref 65–99)
GLUCOSE-CAPILLARY: 150 mg/dL — AB (ref 65–99)
Glucose-Capillary: 153 mg/dL — ABNORMAL HIGH (ref 65–99)
Glucose-Capillary: 212 mg/dL — ABNORMAL HIGH (ref 65–99)

## 2016-10-29 LAB — ALBUMIN: ALBUMIN: 1 g/dL — AB (ref 3.5–5.0)

## 2016-10-29 LAB — BLOOD GAS, ARTERIAL
Acid-base deficit: 8.4 mmol/L — ABNORMAL HIGH (ref 0.0–2.0)
Bicarbonate: 16.6 mmol/L — ABNORMAL LOW (ref 20.0–28.0)
DRAWN BY: 31101
FIO2: 40
O2 SAT: 99 %
PEEP: 5 cmH2O
PH ART: 7.312 — AB (ref 7.350–7.450)
Patient temperature: 98.6
RATE: 20 resp/min
VT: 570 mL
pCO2 arterial: 33.8 mmHg (ref 32.0–48.0)
pO2, Arterial: 167 mmHg — ABNORMAL HIGH (ref 83.0–108.0)

## 2016-10-29 LAB — CULTURE, RESPIRATORY W GRAM STAIN

## 2016-10-29 LAB — BASIC METABOLIC PANEL
Anion gap: 11 (ref 5–15)
BUN: 165 mg/dL — AB (ref 6–20)
CALCIUM: 7.6 mg/dL — AB (ref 8.9–10.3)
CO2: 19 mmol/L — ABNORMAL LOW (ref 22–32)
Chloride: 105 mmol/L (ref 101–111)
Creatinine, Ser: 3.49 mg/dL — ABNORMAL HIGH (ref 0.61–1.24)
GFR calc Af Amer: 23 mL/min — ABNORMAL LOW (ref >60)
GFR, EST NON AFRICAN AMERICAN: 20 mL/min — AB (ref >60)
Glucose, Bld: 153 mg/dL — ABNORMAL HIGH (ref 65–99)
POTASSIUM: 4.2 mmol/L (ref 3.5–5.1)
SODIUM: 135 mmol/L (ref 135–145)

## 2016-10-29 LAB — PHOSPHORUS: PHOSPHORUS: 11.1 mg/dL — AB (ref 2.5–4.6)

## 2016-10-29 LAB — CULTURE, RESPIRATORY

## 2016-10-29 LAB — TRIGLYCERIDES: TRIGLYCERIDES: 256 mg/dL — AB (ref <150)

## 2016-10-29 MED ORDER — FENTANYL BOLUS VIA INFUSION
50.0000 ug | INTRAVENOUS | Status: DC | PRN
  Filled 2016-10-29: qty 200

## 2016-10-29 MED ORDER — MIDAZOLAM BOLUS VIA INFUSION (WITHDRAWAL LIFE SUSTAINING TX)
5.0000 mg | INTRAVENOUS | Status: DC | PRN
  Administered 2016-10-31: 6 mg via INTRAVENOUS
  Filled 2016-10-29: qty 20

## 2016-10-29 MED ORDER — FENTANYL 2500MCG IN NS 250ML (10MCG/ML) PREMIX INFUSION: 100.0000 ug/h | INTRAVENOUS | Status: DC

## 2016-10-29 MED ORDER — MORPHINE BOLUS VIA INFUSION
5.0000 mg | INTRAVENOUS | Status: DC | PRN
  Administered 2016-10-30: 10 mg via INTRAVENOUS
  Administered 2016-10-31: 20 mg via INTRAVENOUS
  Filled 2016-10-29: qty 20

## 2016-10-29 MED ORDER — SODIUM CHLORIDE 0.9 % IV SOLN
10.0000 mg/h | INTRAVENOUS | Status: DC
  Administered 2016-10-29: 4 mg/h via INTRAVENOUS
  Administered 2016-10-29: 6 mg/h via INTRAVENOUS
  Administered 2016-10-30: 4 mg/h via INTRAVENOUS
  Administered 2016-10-30: 6 mg/h via INTRAVENOUS
  Administered 2016-10-31: 10 mg/h via INTRAVENOUS
  Administered 2016-10-31: 15 mg/h via INTRAVENOUS
  Filled 2016-10-29 (×5): qty 10
  Filled 2016-10-29: qty 20
  Filled 2016-10-29: qty 10

## 2016-10-29 MED ORDER — SODIUM CHLORIDE 0.9 % IV SOLN
10.0000 mg/h | INTRAVENOUS | Status: DC
  Administered 2016-10-29 – 2016-10-31 (×3): 10 mg/h via INTRAVENOUS
  Filled 2016-10-29 (×5): qty 10

## 2016-10-29 NOTE — Progress Notes (Signed)
Patient ID: Joel Sheppard, male   DOB: 07/09/1971, 45 y.o.   MRN: [id number] With current plans, will not follow formally at this time but provide support as needed.

## 2016-10-29 NOTE — Progress Notes (Signed)
[facility]    Date of Admission:  10/11/2016   Total days of antibiotics 9        Day 2 cefazolin           ID: Joel Sheppard is a 45 y.o. male with hx of IVDU admitted for MSSA SAB sepsis, found to have severe AR with moderate sized vegetation and mod TR with large vegetation c/b renal and pulmonary septic emboli and respiratory distress requiring intubation on 11/26 Principal Problem:   Cellulitis Active Problems:   Hyperkalemia   Elevated liver enzymes   Acute renal failure (HCC)   Hepatitis C antibody test positive   Tobacco abuse   Hyponatremia   Hyperglycemia   Hypoalbuminemia due to protein-calorie malnutrition (HCC)   Normocytic anemia   Thrombocytopenia (HCC)   IVDU (intravenous drug user)   History of hepatitis C   Staphylococcus aureus bacteremia with sepsis (HCC)   Endocarditis of tricuspid valve   Aortic valve endocarditis   Severe aortic regurgitation   Severe tricuspid regurgitation   Osler's node   Septic embolism (HCC)   Hypoxemia   Bacteremia   Abscess or cellulitis of foot   Acute hypoxemic respiratory failure (HCC)   Acute bacterial endocarditis   Fever    Subjective: Afebrile, requiring vasopressors.   Recent events: he was switched to cefazolin due to worsening renal function yesterday. He was seen by dr Cyndia Bent from CT surgery who felt grim prognosis and not a surgical candidate for annular/root abscess with severe AI, severe TR with septic emboli. Medications:  .  ceFAZolin (ANCEF) IV  2 g Intravenous Q12H  . chlorhexidine gluconate (MEDLINE KIT)  15 mL Mouth Rinse BID  . famotidine (PEPCID) IV  20 mg Intravenous Q24H  . heparin subcutaneous  5,000 Units Subcutaneous Q8H  . hydrocortisone sod succinate (SOLU-CORTEF) inj  50 mg Intravenous Q6H  . insulin aspart  1-3 Units Subcutaneous Q4H  . lanthanum  1,000 mg Oral TID WC  . mouth rinse  15 mL Mouth Rinse QID  . sodium bicarbonate  1,300 mg Oral TID  .  sodium chloride flush  3 mL Intravenous Q12H    Objective: Vital signs in last 24 hours: Temp:  [96.8 F (36 C)-98.4 F (36.9 C)] 98.2 F (36.8 C) (11/29 0739) Pulse Rate:  [71-101] 72 (11/29 0845) Resp:  [10-27] 20 (11/29 0845) BP: (82-118)/(26-50) 101/37 (11/29 0845) SpO2:  [95 %-100 %] 100 % (11/29 0845) FiO2 (%):  [40 %] 40 % (11/29 0812) Weight:  [177 lb 4 oz (80.4 kg)] 177 lb 4 oz (80.4 kg) (11/29 0500)  Physical Exam  Constitutional: sedated, intubated. He appears well-developed and well-nourished. No distress.  HENT: OETT in place Neck: elevated JVD Cardiovascular: tachy, regular rhythm and normal heart sounds. Exam reveals no gallop and no friction rub.  No murmur heard.  Pulmonary/Chest: Effort normal and breath sounds normal. No respiratory distress. He has no wheezes.  Abdominal: Soft. Bowel sounds are decreased. He exhibits no distension. There is no tenderness.  Skin: Skin is warm and dry. Scattered abrasions, bilateral lesions of foot are covered, numerous tattoos to arms and torso     Lab Results  Recent Labs  10/28/16 0402 10/28/16 0403 10/29/16 0350  WBC 16.8*  --  12.3*  HGB 7.9*  --  7.9*  HCT 23.7*  --  23.8*  NA  --  136 135  K  --  4.2 4.2  CL  --  108 105  CO2  --  18* 19*  BUN  --  156* 165*  CREATININE  --  3.39* 3.49*   Liver Panel  Recent Labs  10/26/16 1329 10/28/16 0403 10/29/16 0350  PROT 4.7*  --   --   ALBUMIN 1.1* 1.0* 1.0*  AST 39  --   --   ALT 22  --   --   ALKPHOS 79  --   --   BILITOT 3.2*  --   --     Microbiology: 11/26 resp cx reincubating 11/25 blood cx pending 11/24 blood cx MSSA 11/22 blood cx MSSA Studies/Results: Ct Chest Wo Contrast  Result Date: 10/29/2016 CLINICAL DATA:  Necrotizing pneumonia. EXAM: CT CHEST WITHOUT CONTRAST TECHNIQUE: Multidetector CT imaging of the chest was performed following the standard protocol without IV contrast. COMPARISON:  Chest radiographs dating back to 09/06/2016.  FINDINGS: Cardiovascular: Left IJ central line terminates in the high SVC. Pulmonary arteries appear enlarged. Heart size normal. No pericardial effusion. Mediastinum/Nodes: Mediastinal lymph nodes measure up to 9 mm in the low right paratracheal station. There are calcified bi hilar lymph nodes. No definite axillary adenopathy. Soft tissue resolution is poor. Patient is cachectic. Lungs/Pleura: Endotracheal tube terminates at the carina. Fairly diffuse pulmonary parenchymal ground-glass, upper/ mid lung zone predominant. Moderate narrowing bilateral pleural effusions. Image quality is degraded by respiratory motion. Upper Abdomen: Patient was scanned with arms down, creating a great deal streak artifact through the upper abdomen, limiting diagnostic quality. Nasogastric tube terminates in the stomach. Musculoskeletal: No worrisome lytic or sclerotic lesions. IMPRESSION: 1. Endotracheal tube terminates at the carina. Pulling back approximately 3 cm would better position the tip above the carina. Patient has had a chest radiograph subsequent to this dictation. Please refer to that study and retract as clinically indicated. 2. Mid and upper lung zone predominant diffuse pulmonary parenchymal ground-glass, worrisome for adult respiratory distress syndrome/diffuse alveolar damage, in the appropriate clinical setting. 3. Moderate simple appearing bilateral pleural effusions. 4. Pulmonary arteries appear enlarged, suggesting pulmonary arterial hypertension. Electronically Signed   By: Lorin Picket M.D.   On: 10/29/2016 09:07   Dg Chest Port 1 View  Result Date: 10/29/2016 CLINICAL DATA:  Intubated.  Shortness of breath. EXAM: PORTABLE CHEST 1 VIEW COMPARISON:  10/28/2016 FINDINGS: Endotracheal tube is 1 cm above the carina. Nasogastric tube enters the abdomen. Left internal jugular central line has its tip in the SVC at the azygos level. Heart size is normal. Widespread alveolar density process, right worse than  left, consistent with pneumonia. There are probably pleural effusions. There may be slight improvement when compared to the previous film. IMPRESSION: Lines and tubes well positioned. Diffuse pneumonia right worse than left. Question slight improvement. Likely pleural effusions as well. Electronically Signed   By: Nelson Chimes M.D.   On: 10/29/2016 07:12   Dg Chest Port 1 View  Result Date: 10/28/2016 CLINICAL DATA:  45 year old male on ventilator. Overdose. Subsequent encounter. EXAM: PORTABLE CHEST 1 VIEW COMPARISON:  10/15/2016 FINDINGS: Endotracheal tube tip 2.4 cm above the carina. Left central line tip proximal superior vena cava level. Diffuse slightly asymmetric airspace disease similar to prior exam and may represent neurogenic pulmonary edema although infectious infiltrate not excluded in proper clinical setting. No gross pneumothorax. Mediastinal and cardiac silhouette unchanged. Nasogastric tube courses below the diaphragm. Tip is not included on the present exam. IMPRESSION: Diffuse slightly asymmetric airspace disease similar to prior exam and may represent neurogenic pulmonary edema although infectious infiltrate  not excluded in proper clinical setting. Electronically Signed   By: Genia Del M.D.   On: 10/28/2016 07:59   Dg Abd Portable 1v  Result Date: 10/25/2016 CLINICAL DATA:  Feeding tube placed EXAM: PORTABLE ABDOMEN - 1 VIEW COMPARISON:  10/26/2016 FINDINGS: NG tube tip is in the mid stomach. Gas-filled loops of small and large bowel are not significantly changed. There is no obvious free intraperitoneal gas. IMPRESSION: NG tube tip is in the mid stomach. This is not significantly changed. Additional enteric tubes are not visualized. Electronically Signed   By: Marybelle Killings M.D.   On: 10/19/2016 13:09   Dg Foot Complete Left  Result Date: 10/28/2016 CLINICAL DATA:  Bilateral feet ulcers. EXAM: LEFT FOOT - COMPLETE 3+ VIEW COMPARISON:  Radiographs of October 21, 2016.  FINDINGS: There is no evidence of fracture or dislocation. There is no evidence of arthropathy or other focal bone abnormality. Soft tissues are unremarkable. IMPRESSION: No significant abnormality seen in the left foot. Electronically Signed   By: Marijo Conception, M.D.   On: 10/28/2016 10:08   Dg Foot Complete Right  Result Date: 10/28/2016 CLINICAL DATA:  Bilateral foot ulcers. EXAM: RIGHT FOOT COMPLETE - 3+ VIEW COMPARISON:  Radiographs of October 21, 2016. FINDINGS: There is no evidence of fracture or dislocation. There is no evidence of arthropathy or other focal bone abnormality. Soft tissues are unremarkable. IMPRESSION: Normal right foot. Electronically Signed   By: Marijo Conception, M.D.   On: 10/28/2016 10:11   TTE: - Left ventricle: The cavity size was normal. Wall thickness was   normal. Systolic function was normal. The estimated ejection   fraction was in the range of 60% to 65%. Wall motion was normal;   there were no regional wall motion abnormalities. - Aortic valve: There was a medium-sized vegetation on the left   ventricular aspect. There was severe regurgitation. Valve area   (VTI): 2.11 cm^2. Valve area (Vmax): 2.01 cm^2. Valve area   (Vmean): 1.92 cm^2. - Tricuspid valve: There was a large vegetation on the right atrial   aspect. There was mild-moderate regurgitation. - Pulmonary arteries: Systolic pressure was mildly increased. PA   peak pressure: 39 mm Hg (S).   Assessment/Plan:  disseminated MSSA bacteremia with sepsis c/b dual valve -AV and TV endocarditis with aortic root abscess, deemed not a surgical candidate= continue on cefazolin. Due to destruction to valves, at high risk for worsening cardiogenic shock, pulmonary edema. Continue with antibiotics, but unlikely to survive without cardiac surgery. Recommend goals of care meeting with family  11/25 blood cx are negative   Fevers = reflection of burden of disease, with pulmonary and renal septic  emboli/endocarditis, will continue to monitor. Repeat blood cx are negative thus far.  AKI = continues to worsen, meeting criteria for CRRT but again need to address goals of care  resp failure = remains on vent. Managed by PCCM. CXR per my read appears to be worsening. CT suggestive of ARDS associated with infection  Lower extremity abscess= appreciate wound care management and recs. Continue to use iodoform pack strip to help with drainage  Addendum = Dr Hubert Azure has discussed with family poor prognosis and will change over to comfort care.  Baxter Flattery [address] [facility] Cell: [phone] Pager: [phone]  10/29/2016, 9:54 AM

## 2016-10-29 NOTE — Progress Notes (Signed)
PULMONARY / CRITICAL CARE MEDICINE   Name: Joel Sheppard MRN: [id number] DOB: November 29, 1971    ADMISSION DATE:  2016-11-08 CONSULTATION DATE:  11/26  REFERRING MD:  Rito Ehrlich ([facility])   CHIEF COMPLAINT:  Respiratory distress   BRIEF:  45yo male with hx Hep C, IVDA with recent hospitalization for heroin overdose initially admitted 11/21 with L foot and R tibial cellulitis.  He was found to have MSSA bacteremia with both R and L sided endocarditis and severe Aortic regurg and severe TR with significant sized vegetations on both valves as well as concern for septic emboli to the kidneys.  Has been on IV abx, followed by ID with ongoing positive blood cultures.  On 11/26 had progressive respiratory distress and requiring tx to ICU and urgent intubation with concern for septic pulmonary emboli.    SIGNIFICANT EVENTS: 11/21: admitted for cellulitis and MSSA bacteremia  11/26: progressive respiratory distress, intubated   SUBJECTIVE:  Agitation has improved. Requiring Levophed for BP support.   VITAL SIGNS: BP (!) 97/35   Pulse 73   Temp 98.2 F (36.8 C) (Oral)   Resp 20   Ht 5\' 9"  (1.753 m)   Wt 177 lb 4 oz (80.4 kg)   SpO2 100%   BMI 26.18 kg/m   HEMODYNAMICS: CVP:  [2 mmHg-12 mmHg] 4 mmHg  VENTILATOR SETTINGS: Vent Mode: PRVC FiO2 (%):  [40 %] 40 % Set Rate:  [20 bmp] 20 bmp Vt Set:  [570 mL] 570 mL PEEP:  [5 cmH20] 5 cmH20 Pressure Support:  [5 cmH20] 5 cmH20 Plateau Pressure:  [18 cmH20-19 cmH20] 18 cmH20  INTAKE / OUTPUT: I/O last 3 completed shifts: In: 4129.8 [I.V.:2299.8; NG/GT:1180; IV Piggyback:650] Out: 1415 [Urine:1415]  PHYSICAL EXAMINATION: General:  Chronically ill appearing male, sedated, intubated.  Neuro:  Sedated, intubated. Moves in response to pain. Not responsive to voice.  HEENT:   ETT, [place]/AT, PERRL, EOM-I Cardiovascular:  S1s2. Holosystolic murmur.  Lungs:  Good air movement. Rhonchi diffusely.  Abdomen:  Soft, +bs, NT, ND Musculoskeletal:   Warm and dry, Gr 1 edema  LABS:  BMET  Recent Labs Lab 10/07/2016 0411 10/28/16 0403 10/29/16 0350  NA 137 136 135  K 3.9 4.2 4.2  CL 108 108 105  CO2 19* 18* 19*  BUN 134* 156* 165*  CREATININE 2.51* 3.39* 3.49*  GLUCOSE 90 133* 153*    Electrolytes  Recent Labs Lab 10/08/2016 0411 10/19/2016 1646 10/28/16 0403 10/29/16 0350  CALCIUM 7.4*  --  7.4* 7.6*  MG 2.6* 2.7* 2.8* 3.0*  PHOS 8.3* 10.1* 9.5* 11.1*    CBC  Recent Labs Lab 10/08/2016 0411 10/28/16 0402 10/29/16 0350  WBC 12.4* 16.8* 12.3*  HGB 7.4* 7.9* 7.9*  HCT 21.9* 23.7* 23.8*  PLT 205 368 401*   Coag's  Recent Labs Lab 10/26/16 1329  APTT 32  INR 1.92   Sepsis Markers  Recent Labs Lab 10/26/16 1330  LATICACIDVEN 2.8*   ABG  Recent Labs Lab 10/26/16 1337 10/28/16 0930 10/29/16 0411  PHART 7.335* 7.339* 7.312*  PCO2ART 36.5 30.2* 33.8  PO2ART 45.7* 95.2 167*    Liver Enzymes  Recent Labs Lab 10/24/16 0349 10/25/16 0513 10/26/16 1329 10/28/16 0403 10/29/16 0350  AST 65* 68* 39  --   --   ALT 41 39 22  --   --   ALKPHOS 106 110 79  --   --   BILITOT 1.9* 4.0* 3.2*  --   --   ALBUMIN 1.2* 1.3* 1.1* 1.0*  1.0*   Cardiac Enzymes No results for input(s): TROPONINI, PROBNP in the last 168 hours.  Glucose  Recent Labs Lab 10/28/16 0753 10/28/16 1157 10/28/16 1531 10/28/16 1949 10/29/16 0043 10/29/16 0350  GLUCAP 154* 144* 123* 119* 150* 149*   Imaging Dg Chest Port 1 View  Result Date: 10/29/2016 CLINICAL DATA:  Intubated.  Shortness of breath. EXAM: PORTABLE CHEST 1 VIEW COMPARISON:  10/28/2016 FINDINGS: Endotracheal tube is 1 cm above the carina. Nasogastric tube enters the abdomen. Left internal jugular central line has its tip in the SVC at the azygos level. Heart size is normal. Widespread alveolar density process, right worse than left, consistent with pneumonia. There are probably pleural effusions. There may be slight improvement when compared to the previous  film. IMPRESSION: Lines and tubes well positioned. Diffuse pneumonia right worse than left. Question slight improvement. Likely pleural effusions as well. Electronically Signed   By: Paulina FusiMark  Shogry M.D.   On: 10/29/2016 07:12   Dg Chest Port 1 View  Result Date: 10/28/2016 CLINICAL DATA:  50Forty-five year old male on ventilator. Overdose. Subsequent encounter. EXAM: PORTABLE CHEST 1 VIEW COMPARISON:  10/22/2016 FINDINGS: Endotracheal tube tip 2.4 cm above the carina. Left central line tip proximal superior vena cava level. Diffuse slightly asymmetric airspace disease similar to prior exam and may represent neurogenic pulmonary edema although infectious infiltrate not excluded in proper clinical setting. No gross pneumothorax. Mediastinal and cardiac silhouette unchanged. Nasogastric tube courses below the diaphragm. Tip is not included on the present exam. IMPRESSION: Diffuse slightly asymmetric airspace disease similar to prior exam and may represent neurogenic pulmonary edema although infectious infiltrate not excluded in proper clinical setting. Electronically Signed   By: Lacy DuverneySteven  Olson M.D.   On: 10/28/2016 07:59   Dg Foot Complete Left  Result Date: 10/28/2016 CLINICAL DATA:  Bilateral feet ulcers. EXAM: LEFT FOOT - COMPLETE 3+ VIEW COMPARISON:  Radiographs of October 21, 2016. FINDINGS: There is no evidence of fracture or dislocation. There is no evidence of arthropathy or other focal bone abnormality. Soft tissues are unremarkable. IMPRESSION: No significant abnormality seen in the left foot. Electronically Signed   By: Lupita RaiderJames  Green Jr, M.D.   On: 10/28/2016 10:08   Dg Foot Complete Right  Result Date: 10/28/2016 CLINICAL DATA:  Bilateral foot ulcers. EXAM: RIGHT FOOT COMPLETE - 3+ VIEW COMPARISON:  Radiographs of October 21, 2016. FINDINGS: There is no evidence of fracture or dislocation. There is no evidence of arthropathy or other focal bone abnormality. Soft tissues are unremarkable.  IMPRESSION: Normal right foot. Electronically Signed   By: Lupita RaiderJames  Green Jr, M.D.   On: 10/28/2016 10:11   STUDIES:  Renal u/s 11/24>>> 1. No mass or hydronephrosis of the kidneys.  2. Bilateral pleural effusions and trace perihepatic ascites. 2D echo 11/22>>> EF 60-65%, med-sized vegetation on aortic valve with severe regurg, large vegetation tricuspid valve with mod TR, PA pressure 39mmHg  TEE 11/27 >> Flail aortic valve cusp, severe aortic insufficiency. Opened abscess of the aortic annulus, located anteriorly and to the left, between the ostia of the right and left coronary arteries, without direct involvement of the coronary ostia. Large vegetation on the tricuspid valve with leaflet perforation and severe tricuspid insufficiency. Mildly depressed left ventricular systolic function, EF 45-50%, substantially lower compared to transthoracic echo performed   CULTURES: BC x 2 11/22>>> MSSA  BC x2 11/24>>> MSSA  BC x2 11/25 >>> NGTD   ANTIBIOTICS: Nafcillin 11/23>>>11/28 Cefazolin 11/28 >>   LINES/TUBES: ETT 11/26>>> L IJ  CVL 11/26>>>  DISCUSSION: 45yo male with known IV drug abuse admitted 11/21 with cellulitis and MSSA bacteremia from multi-valve endocarditis now with respiratory failure requiring intubation.   ASSESSMENT / PLAN:  PULMONARY Acute hypoxemic respiratory failure 2/2 septic emboli from MSSA infective endocarditis, pulm edema P:   Begin PS trials but no extubation given pulmonary condition Responded to diuresis and lasix has been held 11/26 2/2 hypotension.  CT of the chest for ?necrotizing pneumonia Abx as above. ID following.   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 Severe TR and AI. EF 45-50% Hypotension - r/t sepsis and sedation needs and diuresis P:  See ID  Levophed  Cardiology following  CVTS evaluated patient, not a surgical candidate   RENAL AKI - multifactorial from sepsis and likely septic emboli as  well as reduced EF  Non-anion gap Metabolic acidosis  P:   Monitor UOP  Follow BMET  Replace electrolytes as indicated Nephrology consult appreciated, indications for CRRT if pursuing ongoing care  Sodium Bicarb  Stress dose steroids: Solu-cortef 50 mg q6h    GASTROINTESTINAL Hep C infection  Diarrhea - CDiff neg P:   TF on hold to minimize volume status  HEMATOLOGIC Anemia of chronic disease  P:  F/u CBC  SQ heparin   INFECTIOUS Infective endocarditis. Septic emboli.  Possible HCAP.  BLE cellulitis - no evidence of abscess R foot CT Repeat Blood Cx NGTD  Bilateral LE xrays neg for osteo 11/28  P:   ID following  Nafcillin changed to Cefazolin  ID following 6-8 weeks IV abx  CVTS states not surgical candidate    ENDOCRINE No active issue P:   Monitor CBGs   NEUROLOGIC Sedation  Polysubstance abuse, IVDA (shoots fentanyl) P:   RASS goal: -2 Fentanyl and propofol gtts  Daily WUA   FAMILY  - Updates:  No family at bedside, family to arrive today for further discussion  - Inter-disciplinary family meet or Palliative Care meeting due by:  12/3  Marcy Sirenatherine Wallace, D.O. 10/29/2016, 7:31 AM PGY-2, [facility]  Alyson ReedyWesam G. Yacoub, M.D. [facility]. Pager: [phone]. After hours pager: [phone].

## 2016-10-29 NOTE — Progress Notes (Signed)
PULMONARY / CRITICAL CARE MEDICINE   Name: Joel Sheppard MRN: [id number] DOB: 12/20/1970    ADMISSION DATE:  May 05, 2016 CONSULTATION DATE:  11/26  REFERRING MD:  Rito EhrlichKrishnan ([facility])   CHIEF COMPLAINT:  Respiratory distress   BRIEF:  45yo male with hx Hep C, IVDA with recent hospitalization for heroin overdose initially admitted 11/21 with L foot and R tibial cellulitis.  He was found to have MSSA bacteremia with both R and L sided endocarditis and severe Aortic regurg and severe TR with significant sized vegetations on both valves as well as concern for septic emboli to the kidneys.  Has been on IV abx, followed by ID with ongoing positive blood cultures.  On 11/26 had progressive respiratory distress and requiring tx to ICU and urgent intubation with concern for septic pulmonary emboli.    SIGNIFICANT EVENTS: 11/21: admitted for cellulitis and MSSA bacteremia  11/26: progressive respiratory distress, intubated   SUBJECTIVE:  Agitation has improved. Requiring Levophed for BP support.   VITAL SIGNS: BP (!) 100/38   Pulse 73   Temp 98.2 F (36.8 C) (Oral)   Resp 19   Ht 5\' 9"  (1.753 m)   Wt 80.4 kg (177 lb 4 oz)   SpO2 100%   BMI 26.18 kg/m   HEMODYNAMICS: CVP:  [1 mmHg-12 mmHg] 2 mmHg  VENTILATOR SETTINGS: Vent Mode: PRVC FiO2 (%):  [40 %] 40 % Set Rate:  [20 bmp] 20 bmp Vt Set:  [570 mL] 570 mL PEEP:  [5 cmH20] 5 cmH20 Pressure Support:  [5 cmH20] 5 cmH20 Plateau Pressure:  [18 cmH20-19 cmH20] 18 cmH20  INTAKE / OUTPUT: I/O last 3 completed shifts: In: 4129.8 [I.V.:2299.8; NG/GT:1180; IV Piggyback:650] Out: 1415 [Urine:1415]  PHYSICAL EXAMINATION: General:  Chronically ill appearing male, sedated, intubated.  Neuro:  Sedated, intubated. Moves in response to pain. Not responsive to voice.  HEENT:   ETT, [place]/AT, PERRL, EOM-I Cardiovascular:  S1s2. Holosystolic murmur.  Lungs:  Good air movement. Rhonchi diffusely.  Abdomen:  Soft, +bs, NT, ND Musculoskeletal:   Warm and dry, Gr 1 edema  LABS:  BMET  Recent Labs Lab 10/22/2016 0411 10/28/16 0403 10/29/16 0350  NA 137 136 135  K 3.9 4.2 4.2  CL 108 108 105  CO2 19* 18* 19*  BUN 134* 156* 165*  CREATININE 2.51* 3.39* 3.49*  GLUCOSE 90 133* 153*    Electrolytes  Recent Labs Lab 10/08/2016 0411 10/30/2016 1646 10/28/16 0403 10/29/16 0350  CALCIUM 7.4*  --  7.4* 7.6*  MG 2.6* 2.7* 2.8* 3.0*  PHOS 8.3* 10.1* 9.5* 11.1*    CBC  Recent Labs Lab 10/04/2016 0411 10/28/16 0402 10/29/16 0350  WBC 12.4* 16.8* 12.3*  HGB 7.4* 7.9* 7.9*  HCT 21.9* 23.7* 23.8*  PLT 205 368 401*   Coag's  Recent Labs Lab 10/26/16 1329  APTT 32  INR 1.92   Sepsis Markers  Recent Labs Lab 10/26/16 1330  LATICACIDVEN 2.8*   ABG  Recent Labs Lab 10/26/16 1337 10/28/16 0930 10/29/16 0411  PHART 7.335* 7.339* 7.312*  PCO2ART 36.5 30.2* 33.8  PO2ART 45.7* 95.2 167*    Liver Enzymes  Recent Labs Lab 10/24/16 0349 10/25/16 0513 10/26/16 1329 10/28/16 0403 10/29/16 0350  AST 65* 68* 39  --   --   ALT 41 39 22  --   --   ALKPHOS 106 110 79  --   --   BILITOT 1.9* 4.0* 3.2*  --   --   ALBUMIN 1.2* 1.3* 1.1* 1.0*  1.0*   Cardiac Enzymes No results for input(s): TROPONINI, PROBNP in the last 168 hours.  Glucose  Recent Labs Lab 10/28/16 1531 10/28/16 1949 10/29/16 0008 10/29/16 0043 10/29/16 0350 10/29/16 0738  GLUCAP 123* 119* 212* 150* 149* 153*   Imaging Ct Chest Wo Contrast  Result Date: 10/29/2016 CLINICAL DATA:  Necrotizing pneumonia. EXAM: CT CHEST WITHOUT CONTRAST TECHNIQUE: Multidetector CT imaging of the chest was performed following the standard protocol without IV contrast. COMPARISON:  Chest radiographs dating back to 09/06/2016. FINDINGS: Cardiovascular: Left IJ central line terminates in the high SVC. Pulmonary arteries appear enlarged. Heart size normal. No pericardial effusion. Mediastinum/Nodes: Mediastinal lymph nodes measure up to 9 mm in the low right  paratracheal station. There are calcified bi hilar lymph nodes. No definite axillary adenopathy. Soft tissue resolution is poor. Patient is cachectic. Lungs/Pleura: Endotracheal tube terminates at the carina. Fairly diffuse pulmonary parenchymal ground-glass, upper/ mid lung zone predominant. Moderate narrowing bilateral pleural effusions. Image quality is degraded by respiratory motion. Upper Abdomen: Patient was scanned with arms down, creating a great deal streak artifact through the upper abdomen, limiting diagnostic quality. Nasogastric tube terminates in the stomach. Musculoskeletal: No worrisome lytic or sclerotic lesions. IMPRESSION: 1. Endotracheal tube terminates at the carina. Pulling back approximately 3 cm would better position the tip above the carina. Patient has had a chest radiograph subsequent to this dictation. Please refer to that study and retract as clinically indicated. 2. Mid and upper lung zone predominant diffuse pulmonary parenchymal ground-glass, worrisome for adult respiratory distress syndrome/diffuse alveolar damage, in the appropriate clinical setting. 3. Moderate simple appearing bilateral pleural effusions. 4. Pulmonary arteries appear enlarged, suggesting pulmonary arterial hypertension. Electronically Signed   By: Leanna BattlesMelinda  Blietz M.D.   On: 10/29/2016 09:07   Dg Chest Port 1 View  Result Date: 10/29/2016 CLINICAL DATA:  Intubated.  Shortness of breath. EXAM: PORTABLE CHEST 1 VIEW COMPARISON:  10/28/2016 FINDINGS: Endotracheal tube is 1 cm above the carina. Nasogastric tube enters the abdomen. Left internal jugular central line has its tip in the SVC at the azygos level. Heart size is normal. Widespread alveolar density process, right worse than left, consistent with pneumonia. There are probably pleural effusions. There may be slight improvement when compared to the previous film. IMPRESSION: Lines and tubes well positioned. Diffuse pneumonia right worse than left. Question  slight improvement. Likely pleural effusions as well. Electronically Signed   By: Paulina FusiMark  Shogry M.D.   On: 10/29/2016 07:12   STUDIES:  Renal u/s 11/24>>> 1. No mass or hydronephrosis of the kidneys.  2. Bilateral pleural effusions and trace perihepatic ascites. 2D echo 11/22>>> EF 60-65%, med-sized vegetation on aortic valve with severe regurg, large vegetation tricuspid valve with mod TR, PA pressure 39mmHg  TEE 11/27 >> Flail aortic valve cusp, severe aortic insufficiency. Opened abscess of the aortic annulus, located anteriorly and to the left, between the ostia of the right and left coronary arteries, without direct involvement of the coronary ostia. Large vegetation on the tricuspid valve with leaflet perforation and severe tricuspid insufficiency. Mildly depressed left ventricular systolic function, EF 45-50%, substantially lower compared to transthoracic echo performed   CULTURES: BC x 2 11/22>>> MSSA  BC x2 11/24>>> MSSA  BC x2 11/25 >>> NGTD   ANTIBIOTICS: Nafcillin 11/23>>>11/28 Cefazolin 11/28 >>   LINES/TUBES: ETT 11/26>>> L IJ CVL 11/26>>>  DISCUSSION: 45yo male with known IV drug abuse admitted 11/21 with cellulitis and MSSA bacteremia from multi-valve endocarditis now with respiratory failure requiring intubation.  ASSESSMENT / PLAN:  PULMONARY Acute hypoxemic respiratory failure 2/2 septic emboli from MSSA infective endocarditis, pulm edema P:   Continue full support Comfort measures Plan on withdraw in AM.  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 Severe TR and AI. EF 45-50% Hypotension - r/t sepsis and sedation needs and diuresis P:  Do not increase levo any further  RENAL AKI - multifactorial from sepsis and likely septic emboli as well as reduced EF  Non-anion gap Metabolic acidosis  P:   D/C blood draws KVO IVF  GASTROINTESTINAL Hep C infection  Diarrhea - CDiff neg P:   D/C TF  HEMATOLOGIC Anemia  of chronic disease  P:  D/C further blood draws  INFECTIOUS Infective endocarditis. Septic emboli.  Possible HCAP.  BLE cellulitis - no evidence of abscess R foot CT Repeat Blood Cx NGTD  Bilateral LE xrays neg for osteo 11/28  P:   D/C abx DNR  ENDOCRINE No active issue P:   D/C CBGs  NEUROLOGIC Sedation  Polysubstance abuse, IVDA (shoots fentanyl) P:   Versed and morphine for comfort measures.  FAMILY  - Updates:  Extensive discussion with family, full DNR status, withdrawal in AM upon arrival of brother.  - Inter-disciplinary family meet or Palliative Care meeting due by:  12/3  The patient is critically ill with multiple organ systems failure and requires high complexity decision making for assessment and support, frequent evaluation and titration of therapies, application of advanced monitoring technologies and extensive interpretation of multiple databases.   Critical Care Time devoted to patient care services described in this note is  35  Minutes. This time reflects time of care of this signee Dr Koren Bound. This critical care time does not reflect procedure time, or teaching time or supervisory time of PA/NP/Med student/Med Resident etc but could involve care discussion time.  Alyson Reedy, M.D. [facility] Pulmonary/Critical Care Medicine. Pager: [phone]. After hours pager: [phone].

## 2016-10-29 NOTE — Progress Notes (Signed)
   10/29/16 1330  Clinical Encounter Type  Visited With Patient and family together  Visit Type Other (Comment) ([facility] consult)  Referral From Nurse  Consult/Referral To Chaplain  Spiritual Encounters  Spiritual Needs Prayer;Emotional  Stress Factors  Patient Stress Factors Not reviewed  Family Stress Factors Family relationships  Introduction to family as Pt unresponsive. Offered prayer and emotional support.

## 2016-10-29 NOTE — Progress Notes (Signed)
Subjective: Interval History: not surgical candidate, on pressors  Objective: Vital signs in last 24 hours: Temp:  [96.8 F (36 C)-98.7 F (37.1 C)] 98.2 F (36.8 C) (11/29 0351) Pulse Rate:  [71-101] 74 (11/29 0615) Resp:  [10-27] 20 (11/29 0615) BP: (82-118)/(26-50) 99/34 (11/29 0615) SpO2:  [95 %-100 %] 100 % (11/29 0615) FiO2 (%):  [40 %] 40 % (11/29 0400) Weight:  [80.4 kg (177 lb 4 oz)] 80.4 kg (177 lb 4 oz) (11/29 0500) Weight change: 1.5 kg (3 lb 4.9 oz)  Intake/Output from previous day: 11/28 0701 - 11/29 0700 In: 1951.1 [I.V.:1501.1; NG/GT:100; IV Piggyback:350] Out: 950 [Urine:950] Intake/Output this shift: Total I/O In: 866.6 [I.V.:866.6] Out: 350 [Urine:350]  General appearance: sedated, not coop Resp: rales bilaterally and rhonchi bilaterally Cardio: S1, S2 normal, systolic murmur: systolic ejection 2/6, decrescendo at 2nd left intercostal space, 2nd systolic murmur: holosystolic 2/6, blowing at lower left sternal border and diastolic murmur: holodiastolic 2/6, decrescendo at 2nd left intercostal space GI: below Extremities: pos bs, soft, Liver down 6 cm, spleen tip down  Extrem  Would L foot and evidence spot ischemic areas Lab Results:  Recent Labs  10/28/16 0402 10/29/16 0350  WBC 16.8* 12.3*  HGB 7.9* 7.9*  HCT 23.7* 23.8*  PLT 368 401*   BMET:  Recent Labs  10/28/16 0403 10/29/16 0350  NA 136 135  K 4.2 4.2  CL 108 105  CO2 18* 19*  GLUCOSE 133* 153*  BUN 156* 165*  CREATININE 3.39* 3.49*  CALCIUM 7.4* 7.6*   No results for input(s): PTH in the last 72 hours. Iron Studies: No results for input(s): IRON, TIBC, TRANSFERRIN, FERRITIN in the last 72 hours.  Studies/Results: Dg Chest Port 1 View  Result Date: 10/28/2016 CLINICAL DATA:  63Forty-five year old male on ventilator. Overdose. Subsequent encounter. EXAM: PORTABLE CHEST 1 VIEW COMPARISON:  10/19/2016 FINDINGS: Endotracheal tube tip 2.4 cm above the carina. Left central line tip  proximal superior vena cava level. Diffuse slightly asymmetric airspace disease similar to prior exam and may represent neurogenic pulmonary edema although infectious infiltrate not excluded in proper clinical setting. No gross pneumothorax. Mediastinal and cardiac silhouette unchanged. Nasogastric tube courses below the diaphragm. Tip is not included on the present exam. IMPRESSION: Diffuse slightly asymmetric airspace disease similar to prior exam and may represent neurogenic pulmonary edema although infectious infiltrate not excluded in proper clinical setting. Electronically Signed   By: Lacy DuverneySteven  Olson M.D.   On: 10/28/2016 07:59   Dg Abd Portable 1v  Result Date: 10/13/2016 CLINICAL DATA:  Feeding tube placed EXAM: PORTABLE ABDOMEN - 1 VIEW COMPARISON:  10/26/2016 FINDINGS: NG tube tip is in the mid stomach. Gas-filled loops of small and large bowel are not significantly changed. There is no obvious free intraperitoneal gas. IMPRESSION: NG tube tip is in the mid stomach. This is not significantly changed. Additional enteric tubes are not visualized. Electronically Signed   By: Jolaine ClickArthur  Hoss M.D.   On: 10/29/2016 13:09   Dg Foot Complete Left  Result Date: 10/28/2016 CLINICAL DATA:  Bilateral feet ulcers. EXAM: LEFT FOOT - COMPLETE 3+ VIEW COMPARISON:  Radiographs of October 21, 2016. FINDINGS: There is no evidence of fracture or dislocation. There is no evidence of arthropathy or other focal bone abnormality. Soft tissues are unremarkable. IMPRESSION: No significant abnormality seen in the left foot. Electronically Signed   By: Lupita RaiderJames  Green Jr, M.D.   On: 10/28/2016 10:08   Dg Foot Complete Right  Result Date: 10/28/2016 CLINICAL  DATA:  Bilateral foot ulcers. EXAM: RIGHT FOOT COMPLETE - 3+ VIEW COMPARISON:  Radiographs of October 21, 2016. FINDINGS: There is no evidence of fracture or dislocation. There is no evidence of arthropathy or other focal bone abnormality. Soft tissues are unremarkable.  IMPRESSION: Normal right foot. Electronically Signed   By: Lupita RaiderJames  Green Jr, M.D.   On: 10/28/2016 10:11    I have reviewed the patient's current medications.  Assessment/Plan: 1 AKI slowly worse but rate of rise less, azotemic. Has indic for CRRt if to pursue ongoing care..  Vol xs, acidemic.^ solute 2 Endocardits on AB poor outlooki 3 substance abuse 4 VDRF 5 Nutrition P supportive care, will intervene depending on family meeting    LOS: 8 days   Kareli Hossain,Decoda L 10/29/2016,6:59 AM

## 2016-10-29 NOTE — Progress Notes (Signed)
150 mls fentanyl and propofol 40 mls wasted and witnessed by Marcellus ScottLesley wilson RN

## 2016-10-30 LAB — CULTURE, BLOOD (ROUTINE X 2)
CULTURE: NO GROWTH
Culture: NO GROWTH

## 2016-10-30 NOTE — Care Management Note (Signed)
Case Management Note  Patient Details  Name: Joel Sheppard MRN: [id number] Date of Birth: 09/09/1971  Subjective/Objective:    Pt admitted with infective endocarditis                Action/Plan:  PTA from home - IV drug abuser.  Both tricuspid and bicuspid valve damaged - Surgery consulted.  CSW consulted for placement for long term IV antibiotics.  CM will continue to follow for discharge needs   Expected Discharge Date:                  Expected Discharge Plan:     In-House Referral:  Clinical Social Work  Discharge planning Services  CM Consult  Post Acute Care Choice:    Choice offered to:     DME Arranged:    DME Agency:     HH Arranged:    HH Agency:     Status of Service:  In process, will continue to follow  If discussed at Long Length of Stay Meetings, dates discussed:    Additional Comments: 10/30/2016 Extubated and transitioned to comfort care Cherylann ParrClaxton, [name], RN 10/30/2016, 11:36 AM

## 2016-10-30 NOTE — Progress Notes (Signed)
PULMONARY / CRITICAL CARE MEDICINE   Name: Joel Sheppard Welcome MRN: [id number] DOB: 02/21/1971    ADMISSION DATE:  07/25/16 CONSULTATION DATE:  11/26  REFERRING MD:  Rito EhrlichKrishnan ([facility])   CHIEF COMPLAINT:  Respiratory distress   BRIEF:  45yo male with hx Hep C, IVDA with recent hospitalization for heroin overdose initially admitted 11/21 with L foot and R tibial cellulitis.  He was found to have MSSA bacteremia with both R and L sided endocarditis and severe Aortic regurg and severe TR with significant sized vegetations on both valves as well as concern for septic emboli to the kidneys.  Has been on IV abx, followed by ID with ongoing positive blood cultures.  On 11/26 had progressive respiratory distress and requiring tx to ICU and urgent intubation with concern for septic pulmonary emboli.    SIGNIFICANT EVENTS: 11/21: admitted for cellulitis and MSSA bacteremia  11/26: progressive respiratory distress, intubated  11/28: CVTS stated pt would not be candidate for surgery  11/29: Family made decision to withdraw care   SUBJECTIVE:  Comfortable with sedation. Some occasional grimacing per RN.   VITAL SIGNS: BP (!) 103/42   Pulse 75   Temp 99.5 F (37.5 C) (Oral)   Resp 20   Ht 6' (1.829 m)   Wt 233 lb 14.5 oz (106.1 kg)   SpO2 100%   BMI 31.72 kg/m   HEMODYNAMICS: CVP:  [1 mmHg-17 mmHg] 2 mmHg  VENTILATOR SETTINGS: Vent Mode: PRVC FiO2 (%):  [40 %] 40 % Set Rate:  [20 bmp] 20 bmp Vt Set:  [570 mL] 570 mL PEEP:  [5 cmH20] 5 cmH20 Plateau Pressure:  [16 cmH20-118 cmH20] 118 cmH20  INTAKE / OUTPUT: I/O last 3 completed shifts: In: 2147.1 [I.V.:2147.1] Out: 1350 [Urine:1150; Stool:200]  PHYSICAL EXAMINATION: General:  Chronically ill appearing male, sedated, intubated.  Neuro:  Sedated, intubated. No response to voice or stimuli.  HEENT:   ETT, /AT,MMM  Cardiovascular:  S1s2. Holosystolic murmur.  Lungs:  Good air movement. Rhonchi diffusely.  Abdomen:  Soft, +bs,  NT, ND Musculoskeletal:  Warm and dry, Gr 1-2 edema  LABS:  BMET  Recent Labs Lab 10/11/2016 0411 10/28/16 0403 10/29/16 0350  NA 137 136 135  K 3.9 4.2 4.2  CL 108 108 105  CO2 19* 18* 19*  BUN 134* 156* 165*  CREATININE 2.51* 3.39* 3.49*  GLUCOSE 90 133* 153*    Electrolytes  Recent Labs Lab 10/16/2016 0411 10/11/2016 1646 10/28/16 0403 10/29/16 0350  CALCIUM 7.4*  --  7.4* 7.6*  MG 2.6* 2.7* 2.8* 3.0*  PHOS 8.3* 10.1* 9.5* 11.1*    CBC  Recent Labs Lab 10/23/2016 0411 10/28/16 0402 10/29/16 0350  WBC 12.4* 16.8* 12.3*  HGB 7.4* 7.9* 7.9*  HCT 21.9* 23.7* 23.8*  PLT 205 368 401*   Coag'Sheppard  Recent Labs Lab 10/26/16 1329  APTT 32  INR 1.92   Sepsis Markers  Recent Labs Lab 10/26/16 1330  LATICACIDVEN 2.8*   ABG  Recent Labs Lab 10/26/16 1337 10/28/16 0930 10/29/16 0411  PHART 7.335* 7.339* 7.312*  PCO2ART 36.5 30.2* 33.8  PO2ART 45.7* 95.2 167*    Liver Enzymes  Recent Labs Lab 10/24/16 0349 10/25/16 0513 10/26/16 1329 10/28/16 0403 10/29/16 0350  AST 65* 68* 39  --   --   ALT 41 39 22  --   --   ALKPHOS 106 110 79  --   --   BILITOT 1.9* 4.0* 3.2*  --   --  ALBUMIN 1.2* 1.3* 1.1* 1.0* 1.0*   Cardiac Enzymes No results for input(Sheppard): TROPONINI, PROBNP in the last 168 hours.  Glucose  Recent Labs Lab 10/28/16 1531 10/28/16 1949 10/29/16 0008 10/29/16 0043 10/29/16 0350 10/29/16 0738  GLUCAP 123* 119* 212* 150* 149* 153*   Imaging No results found. STUDIES:  Renal u/Sheppard 11/24>>> 1. No mass or hydronephrosis of the kidneys.  2. Bilateral pleural effusions and trace perihepatic ascites. 2D echo 11/22>>> EF 60-65%, med-sized vegetation on aortic valve with severe regurg, large vegetation tricuspid valve with mod TR, PA pressure 39mmHg  TEE 11/27 >> Flail aortic valve cusp, severe aortic insufficiency. Opened abscess of the aortic annulus, located anteriorly and to the left, between the ostia of the right and left coronary  arteries, without direct involvement of the coronary ostia. Large vegetation on the tricuspid valve with leaflet perforation and severe tricuspid insufficiency. Mildly depressed left ventricular systolic function, EF 45-50%, substantially lower compared to transthoracic echo performed   CULTURES: BC x 2 11/22>>> MSSA  BC x2 11/24>>> MSSA  BC x2 11/25 >>> NGTD   ANTIBIOTICS: Nafcillin 11/23>>>11/28 Cefazolin 11/28 >>11/29   LINES/TUBES: ETT 11/26>>> L IJ CVL 11/26>>>  DISCUSSION: 45yo male with known IV drug abuse admitted 11/21 with cellulitis and MSSA bacteremia from multi-valve endocarditis now with respiratory failure requiring intubation.   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 Severe TR and AI. EF 45-50% Hypotension - r/t sepsis and sedation needs and diuresis P:  D/C levophed  RENAL AKI - multifactorial from sepsis and likely septic emboli as well as reduced EF  Non-anion gap Metabolic acidosis  P:   D/C solucortef Full comfort care  GASTROINTESTINAL Hep C infection  Diarrhea - CDiff neg P:   D/C TF  HEMATOLOGIC Anemia of chronic disease  P:  D/C further CBC  INFECTIOUS Infective endocarditis. Septic emboli.  Possible HCAP.  BLE cellulitis - no evidence of abscess R foot CT Repeat Blood Cx NGTD  Bilateral LE xrays neg for osteo 11/28  P:   CVTS states not surgical candidate  Abx d/c on 11/29   ENDOCRINE No active issue P:   No CBG monitoring or insulin   NEUROLOGIC Sedation  Polysubstance abuse, IVDA (shoots fentanyl) P:   Versed and morphine for comfort measures   FAMILY  - Updates:  Family updated extensively on 11/30   - Inter-disciplinary family meet or Palliative Care meeting due by:  12/3  The patient is critically ill with  multiple organ systems failure and requires high complexity decision making for assessment and support, frequent evaluation and titration of therapies, application of advanced monitoring technologies and extensive interpretation of multiple databases.   Critical Care Time devoted to patient care services described in this note is  35  Minutes. This time reflects time of care of this signee Dr Koren BoundWesam Yacoub. This critical care time does not reflect procedure time, or teaching time or supervisory time of PA/NP/Med student/Med Resident etc but could involve care discussion time.  Alyson ReedyWesam G. Yacoub, M.D. [facility] Pulmonary/Critical Care Medicine. Pager: [phone]. After hours pager: [phone].

## 2016-10-30 NOTE — Procedures (Signed)
Extubation Procedure Note  Patient Details:   Name: Nathaniel ManJames S Verville DOB: 08/01/1971 MRN: [id number]   Airway Documentation:  Airway 7.5 mm (Active)  Secured at (cm) 24 cm 10/30/2016  9:06 AM  Measured From Lips 10/30/2016  9:06 AM  Secured Location Center 10/30/2016  9:06 AM  Secured By [employer] Tube Holder 10/30/2016  9:06 AM  Tube Holder Repositioned Yes 10/30/2016  9:06 AM  Cuff Pressure (cm H2O) 22 cm H2O 10/29/2016  4:19 PM  Site Condition Dry 10/30/2016  9:06 AM    Evaluation  O2 sats: currently acceptable Complications: No apparent complications Patient did tolerate procedure well. Bilateral Breath Sounds: Clear, Diminished   No   Pt extubated to room air per MD request. Withdrawal of care.  Forest BeckerJean S Westley Blass 10/30/2016, 10:22 AM

## 2016-10-31 DIAGNOSIS — Z515 Encounter for palliative care: Secondary | ICD-10-CM

## 2016-10-31 MED ORDER — GLYCOPYRROLATE 0.2 MG/ML IJ SOLN
0.2000 mg | INTRAMUSCULAR | Status: DC | PRN
  Administered 2016-10-31: 0.2 mg via INTRAVENOUS
  Filled 2016-10-31: qty 1

## 2016-10-31 DEATH — deceased

## 2016-12-01 NOTE — Discharge Summary (Signed)
NAME:  Joel Sheppard, Joel Sheppard              ACCOUNT NO.:  [id number]  MEDICAL RECORD NO.:  [id number]  LOCATION:                                 FACILITY:  PHYSICIAN:  Felipa EvenerWesam Jake Athalia Setterlund, MD  DATE OF BIRTH:  08/28/1971  DATE OF ADMISSION:  10/28/2016 DATE OF DISCHARGE:                              DISCHARGE SUMMARY   DEATH SUMMARY  PRIMARY DIAGNOSIS/CAUSE OF DEATH:  Aortic and tricuspid valve endocarditis  SECONDARY DIAGNOSES: 1. Respiratory failure. 2. Septic emboli. 3. Acute renal failure. 4. Hypotension. 5. Non-anion gap metabolic acidosis. 6. Hepatitis C. 7. C diff. 8. Anemia of chronic disease. 9. Bilateral extremity cellulitis. 10.Polysubstance abuse and IV drug abuse.  HOSPITAL COURSE:  The patient is a 46 year old male with past medical history of hepatitis C and IV drug abuse who presented to the hospital with shortness of breath and known to have dual valve endocarditis, aortic and tricuspid valve.  The patient is a nonsurgical candidate given his overall health.  The patient developed septic shock due to septic emboli was intubated.  However, was continued to worsen.  I had an extensive conversation with the family on 11/29.  The patient was made a full DNR, was started on morphine and plan was to withdraw on 11/30.  Morphine and Versed were started on 11/30 and the patient was extubated.  He expired on 12/01 comfortably, family at bedside.     Felipa EvenerWesam Jake Kenzlei Runions, MD   ______________________________ Felipa EvenerWesam Jake Ailanie Ruttan, MD    WJY/MEDQ  D:  11/03/2016  T:  11/04/2016  Job:  [id number]

## 2016-12-01 NOTE — Progress Notes (Signed)
Time of death [id number]. Irven CoeLauren Robbins RN and this RN pronounced death. Sister Kendal HymenBonnie at the bedside. Emotional support given to family. Belonging bag sent home with family. Family will call patient placement with funeral home decision.

## 2016-12-01 NOTE — Progress Notes (Signed)
PULMONARY / CRITICAL CARE MEDICINE   Name: Nathaniel ManJames S Corning MRN: [id number] DOB: 05/28/1971    ADMISSION DATE:  10/03/2016 CONSULTATION DATE:  11/26  REFERRING MD:  Rito EhrlichKrishnan ([facility])   CHIEF COMPLAINT:  Respiratory distress   BRIEF:  46yo male with hx Hep C, IVDA with recent hospitalization for heroin overdose initially admitted 11/21 with L foot and R tibial cellulitis.  He was found to have MSSA bacteremia with both R and L sided endocarditis and severe Aortic regurg and severe TR with significant sized vegetations on both valves as well as concern for septic emboli to the kidneys.  Has been on IV abx, followed by ID with ongoing positive blood cultures.  On 11/26 had progressive respiratory distress and requiring tx to ICU and urgent intubation with concern for septic pulmonary emboli.    SIGNIFICANT EVENTS: 11/21: admitted for cellulitis and MSSA bacteremia  11/26: progressive respiratory distress, intubated  11/28: CVTS stated pt would not be candidate for surgery  11/29: Family made decision to withdraw care   SUBJECTIVE:  Patient appears very comfortable on morphine and versed, no events overnight.  VITAL SIGNS: BP (!) 106/34 (BP Location: Left Arm)   Pulse (!) 128   Temp 99.5 F (37.5 C) (Oral)   Resp 18   Ht 6' (1.829 m)   Wt 106 kg (233 lb 11 oz)   SpO2 (!) 78%   BMI 31.69 kg/m    HEMODYNAMICS:    VENTILATOR SETTINGS:    INTAKE / OUTPUT: I/O last 3 completed shifts: In: 1413 [I.V.:1413] Out: 2275 [Urine:2075; Stool:200]  PHYSICAL EXAMINATION: General:  Chronically ill appearing male, extubated on morphine and versed.  Neuro:  Sedate, intubated. No response to voice or stimuli.  HEENT:   [place]/AT,MMM  Cardiovascular:  Nl S1/S2. Holosystolic murmur.  Lungs:  Good air movement. Rhonchi diffusely.  Abdomen:  Soft, +bs, NT, ND Musculoskeletal:  Warm and dry, Gr 1-2 edema  LABS:  BMET  Recent Labs Lab 10/15/2016 0411 10/28/16 0403 10/29/16 0350  NA 137  136 135  K 3.9 4.2 4.2  CL 108 108 105  CO2 19* 18* 19*  BUN 134* 156* 165*  CREATININE 2.51* 3.39* 3.49*  GLUCOSE 90 133* 153*    Electrolytes  Recent Labs Lab 10/30/2016 0411 10/13/2016 1646 10/28/16 0403 10/29/16 0350  CALCIUM 7.4*  --  7.4* 7.6*  MG 2.6* 2.7* 2.8* 3.0*  PHOS 8.3* 10.1* 9.5* 11.1*    CBC  Recent Labs Lab 10/26/2016 0411 10/28/16 0402 10/29/16 0350  WBC 12.4* 16.8* 12.3*  HGB 7.4* 7.9* 7.9*  HCT 21.9* 23.7* 23.8*  PLT 205 368 401*   Coag's  Recent Labs Lab 10/26/16 1329  APTT 32  INR 1.92   Sepsis Markers  Recent Labs Lab 10/26/16 1330  LATICACIDVEN 2.8*   ABG  Recent Labs Lab 10/26/16 1337 10/28/16 0930 10/29/16 0411  PHART 7.335* 7.339* 7.312*  PCO2ART 36.5 30.2* 33.8  PO2ART 45.7* 95.2 167*    Liver Enzymes  Recent Labs Lab 10/25/16 0513 10/26/16 1329 10/28/16 0403 10/29/16 0350  AST 68* 39  --   --   ALT 39 22  --   --   ALKPHOS 110 79  --   --   BILITOT 4.0* 3.2*  --   --   ALBUMIN 1.3* 1.1* 1.0* 1.0*   Cardiac Enzymes No results for input(s): TROPONINI, PROBNP in the last 168 hours.  Glucose  Recent Labs Lab 10/28/16 1531 10/28/16 1949 10/29/16 0008 10/29/16 0043 10/29/16  0350 10/29/16 0738  GLUCAP 123* 119* 212* 150* 149* 153*   Imaging No results found. STUDIES:  Renal u/s 11/24>>> 1. No mass or hydronephrosis of the kidneys.  2. Bilateral pleural effusions and trace perihepatic ascites. 2D echo 11/22>>> EF 60-65%, med-sized vegetation on aortic valve with severe regurg, large vegetation tricuspid valve with mod TR, PA pressure 39mmHg  TEE 11/27 >> Flail aortic valve cusp, severe aortic insufficiency. Opened abscess of the aortic annulus, located anteriorly and to the left, between the ostia of the right and left coronary arteries, without direct involvement of the coronary ostia. Large vegetation on the tricuspid valve with leaflet perforation and severe tricuspid insufficiency. Mildly depressed  left ventricular systolic function, EF 45-50%, substantially lower compared to transthoracic echo performed   CULTURES: BC x 2 11/22>>> MSSA  BC x2 11/24>>> MSSA  BC x2 11/25 >>> NGTD   ANTIBIOTICS: Nafcillin 11/23>>>11/28 Cefazolin 11/28 >>11/29   LINES/TUBES: ETT 11/26>>>12/1 L IJ CVL 11/26>>>  DISCUSSION: 46yo male with known IV drug abuse admitted 11/21 with cellulitis and MSSA bacteremia from multi-valve endocarditis now with respiratory failure requiring intubation.   ASSESSMENT / PLAN:  46yo male with known IV drug abuse admitted 11/21 with cellulitis and MSSA bacteremia from multi-valve endocarditis now with respiratory failure requiring intubation. Extubated 11/30 to full comfort.  Discussed with bedside and family, will continue morphine and versed and pending condition may need transfer to palliative care floor later on today.  Alyson ReedyWesam G. Yacoub, M.D. [facility] Pulmonary/Critical Care Medicine. Pager: [phone]. After hours pager: [phone].

## 2016-12-01 NOTE — Progress Notes (Signed)
Wasted 150 ml of morphine and 80 ml of versed in sink. Lennox Grumbleson Hovander RN witnessed waste.

## 2016-12-01 DEATH — deceased

## 2016-12-05 ENCOUNTER — Telehealth: Payer: Self-pay

## 2016-12-05 NOTE — Telephone Encounter (Signed)
On 12/05/16 I received a death certificate from [employer] (original) The death certificate is for cremation. The patient is a patient of Doctor Molli KnockYacoub. [phone] Monday (12/08/16) for signature.  On 12/11/2016 I received the death certificate back from Doctor Molli KnockYacoub. I got the death certificate ready and called the funeral home to let them know the death certificate is ready for pickup. I also faxed a copy to the funeral home per the funeral home request.

## 2018-08-29 IMAGING — DX DG CHEST 2V
2 series · 2 of 2 positions shown · non-contrast
Comparison: 03/06/2004

CLINICAL DATA: Coughing up mucus with deep inspiration.

EXAM:
CHEST  2 VIEW

[chest pa]
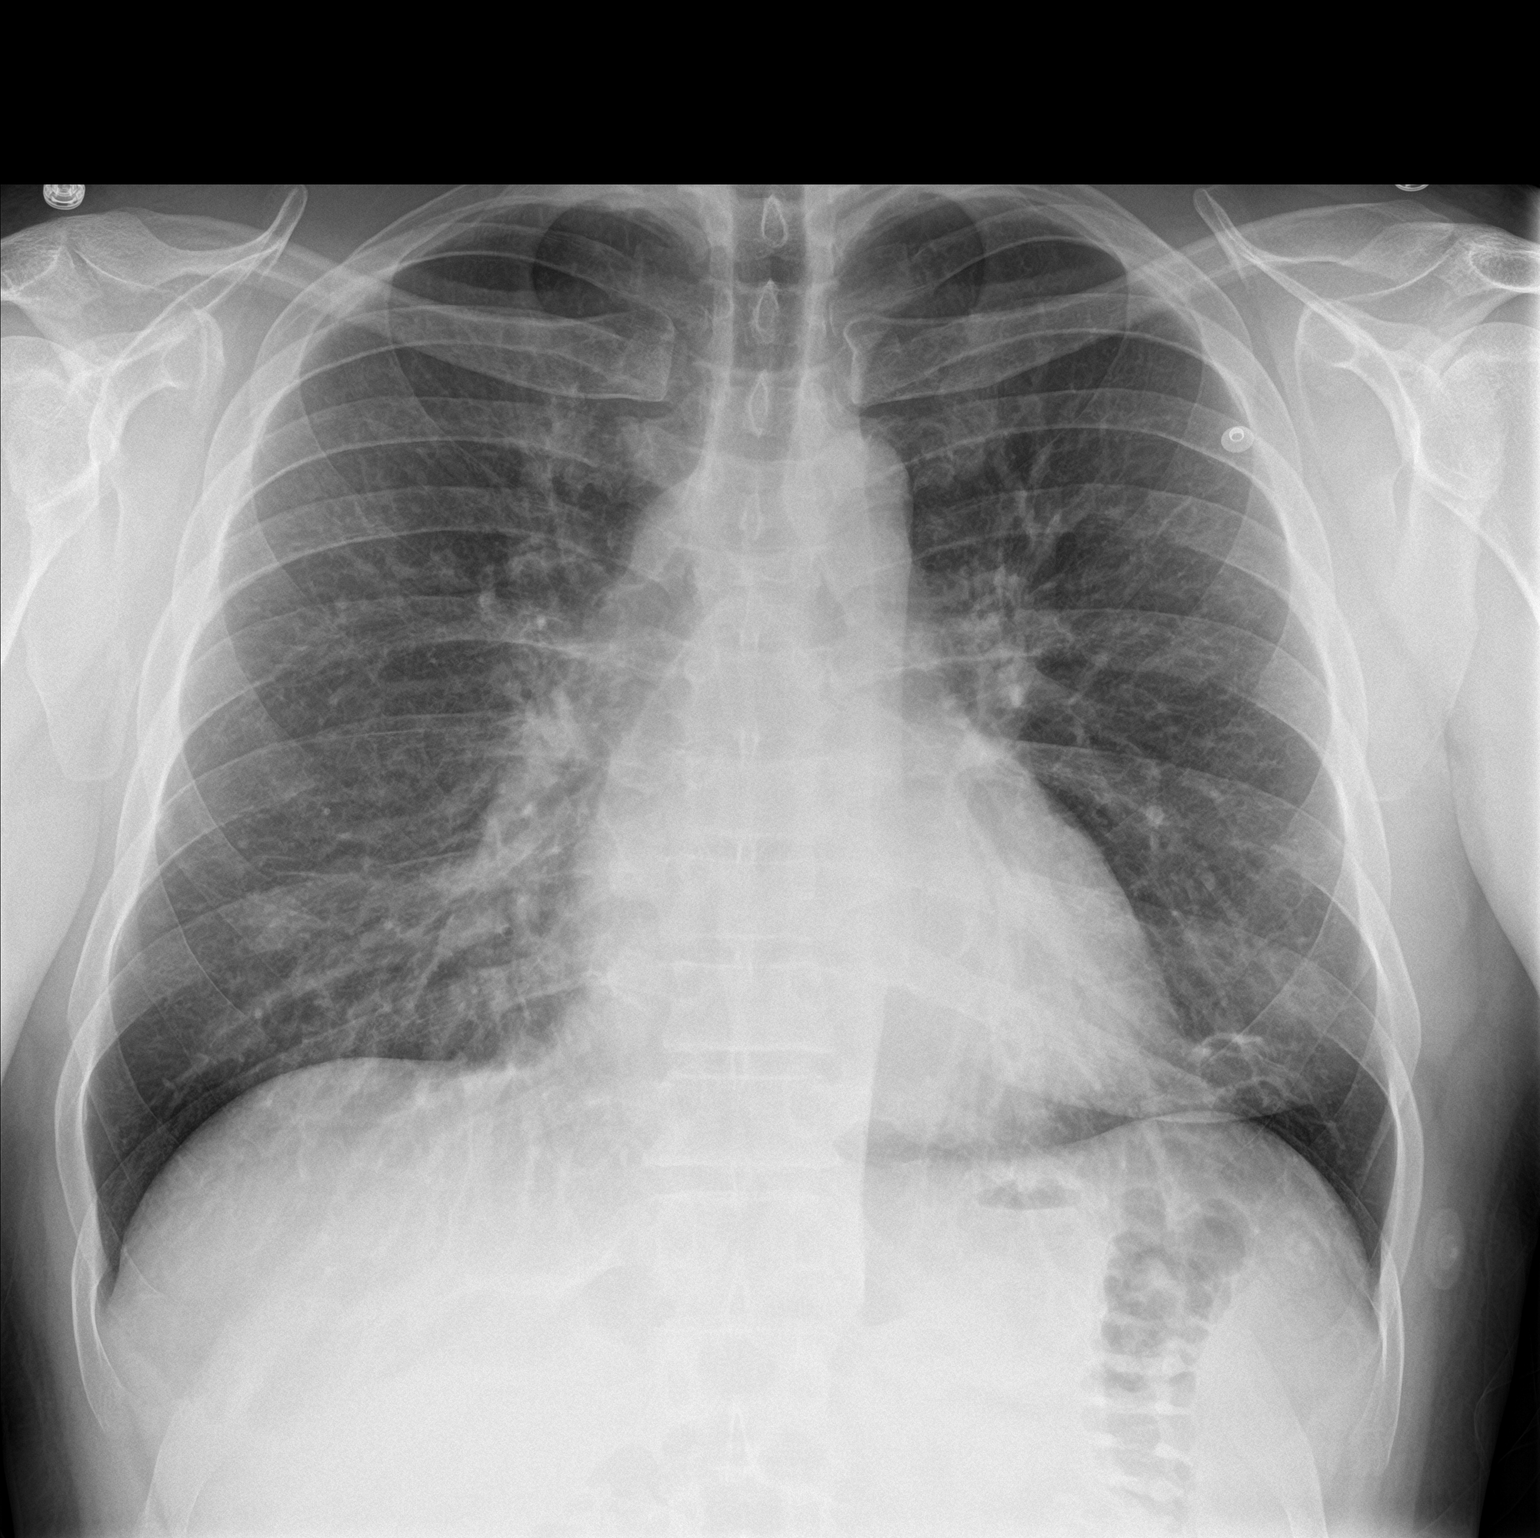

[chest lat]
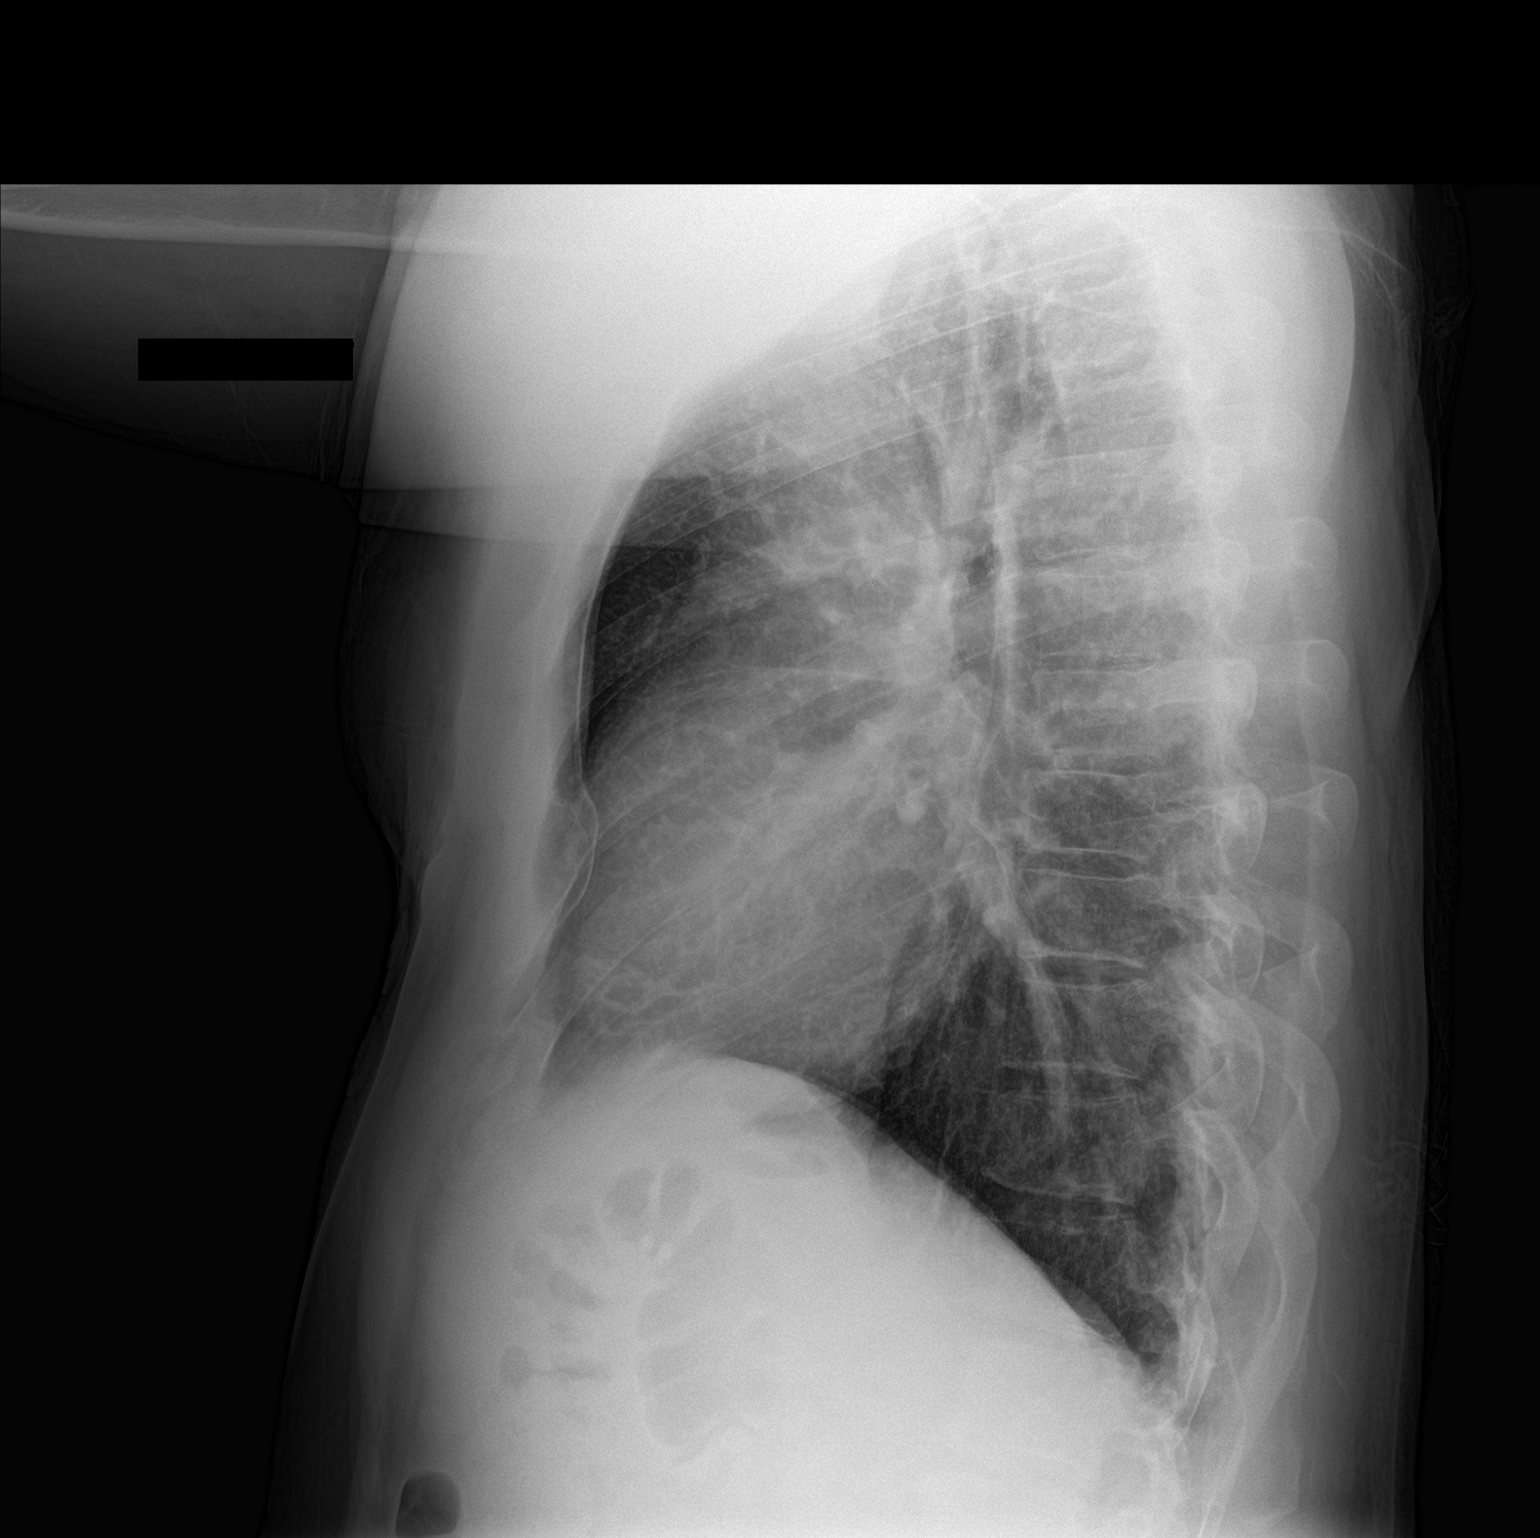

[2 of 2 positions shown; findings below may reference images not displayed]

FINDINGS: Heart size is normal. Mediastinal shadows are normal. There may be
central bronchial thickening. There is scarring in the lingula not
seen previously. No consolidation or collapse. No effusions. No bony
findings.
IMPRESSION: Possible bronchitis.  No consolidation or collapse.

Scarring in the lingula, not present in 0888.

## 2018-10-20 IMAGING — CT CT CHEST W/O CM
2 of 3 series · 11 of 36 positions shown, 13 images · non-contrast
Comparison: Chest radiographs dating back to 09/06/2016.

CLINICAL DATA: Necrotizing pneumonia.

EXAM:
CT CHEST WITHOUT CONTRAST
TECHNIQUE: Multidetector CT imaging of the chest was performed following the
standard protocol without IV contrast.

[Series 201: chest without, idose (2) · axial · non-contrast · 0.89mm/px · z∈[+87,+387]mm · 8 of 142 slices shown, 10 images]
[im 11/142  mediastinal]
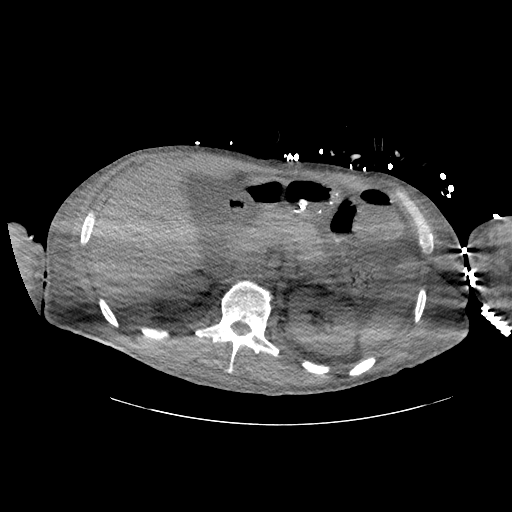
[im 11/142  lung]
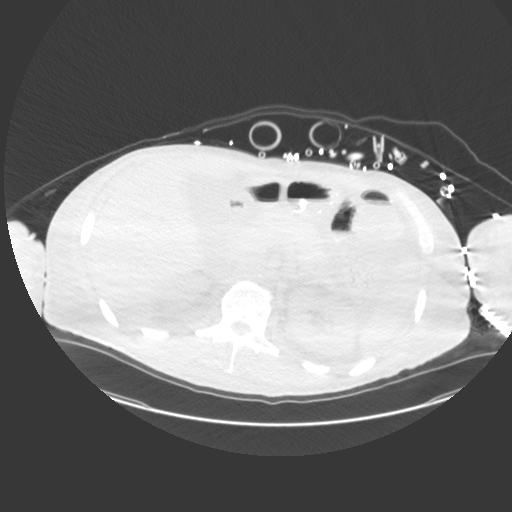
[im 27/142  lung]
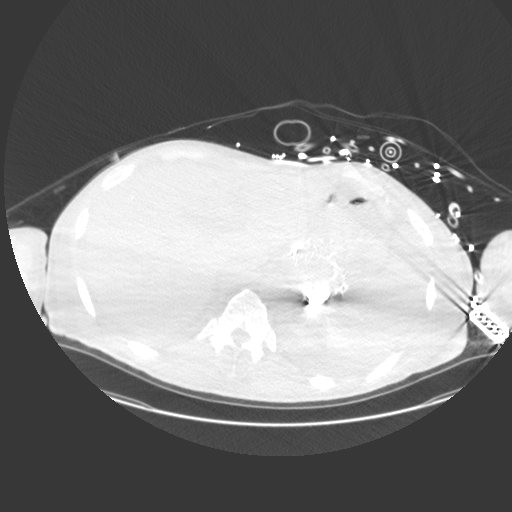
[im 48/142  lung]
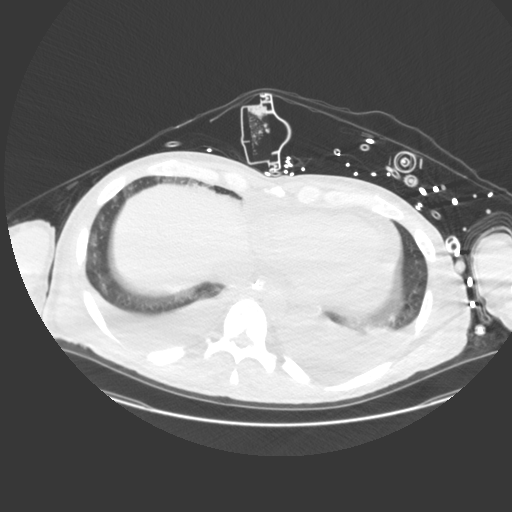
[im 63/142  lung]
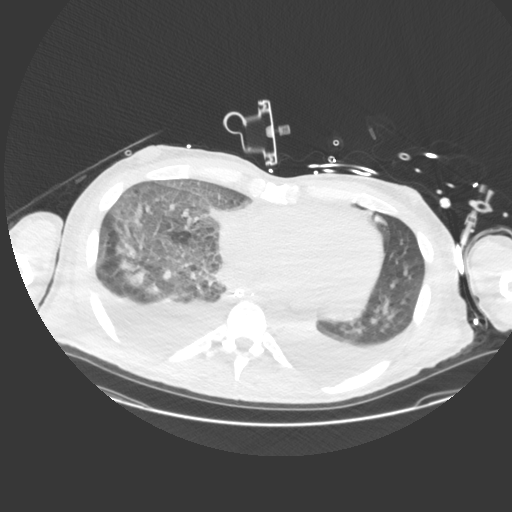
[im 79/142  mediastinal]
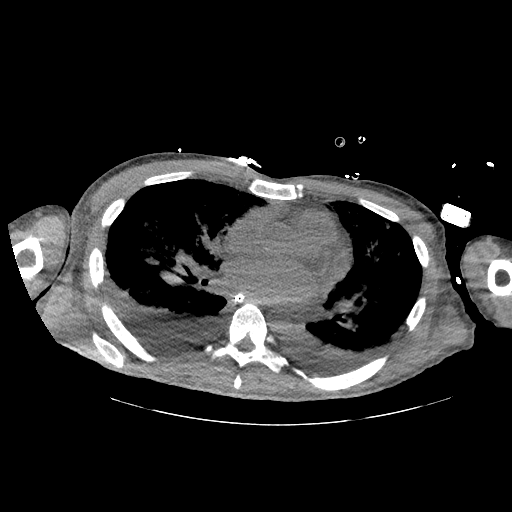
[im 79/142  lung]
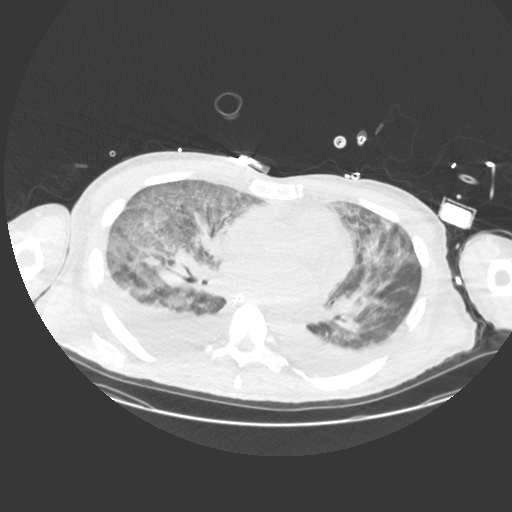
[im 95/142  lung]
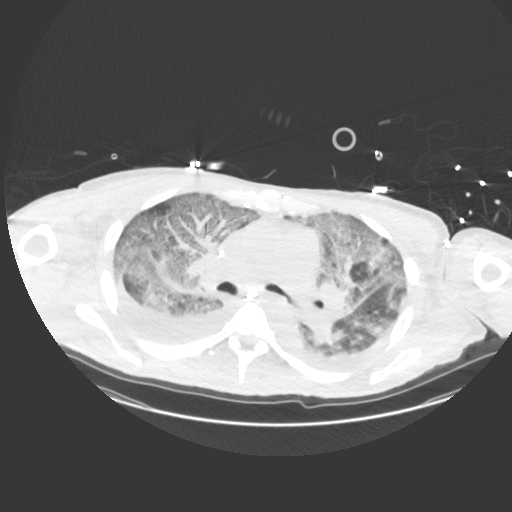
[im 115/142  lung]
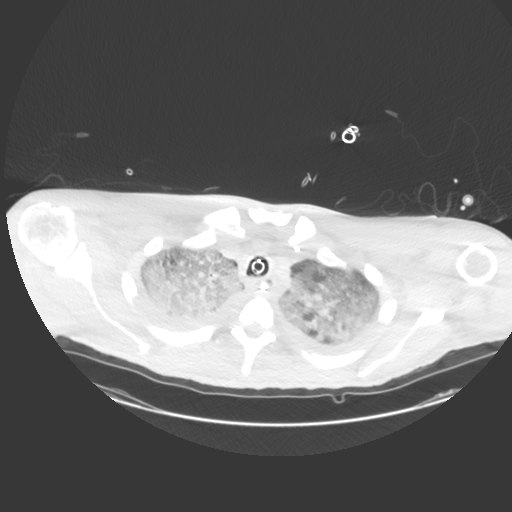
[im 131/142  lung]
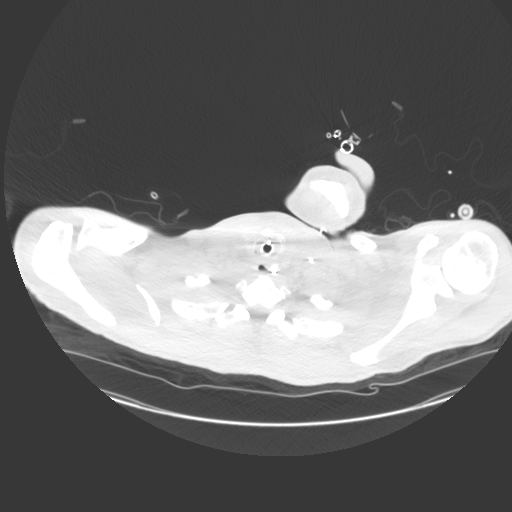

[Series 203: coronal, idose (2) · coronal · 0.45mm/px · 3 of 128 slices shown]
[im 26/128  lung]
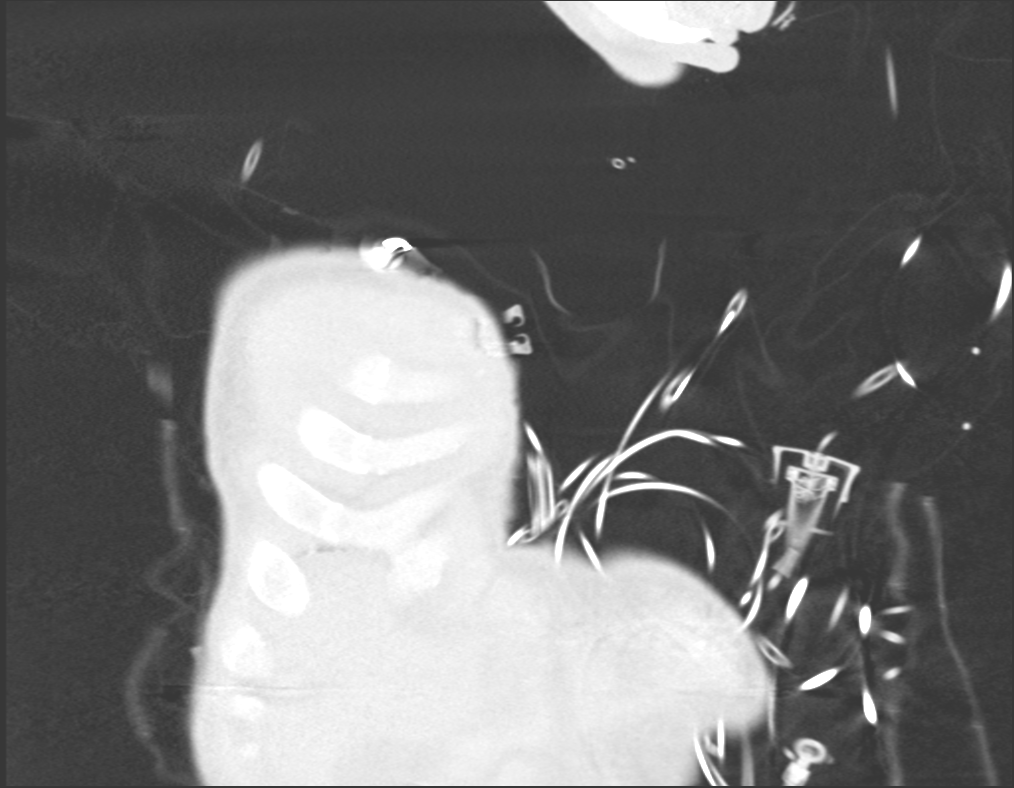
[im 51/128  lung]
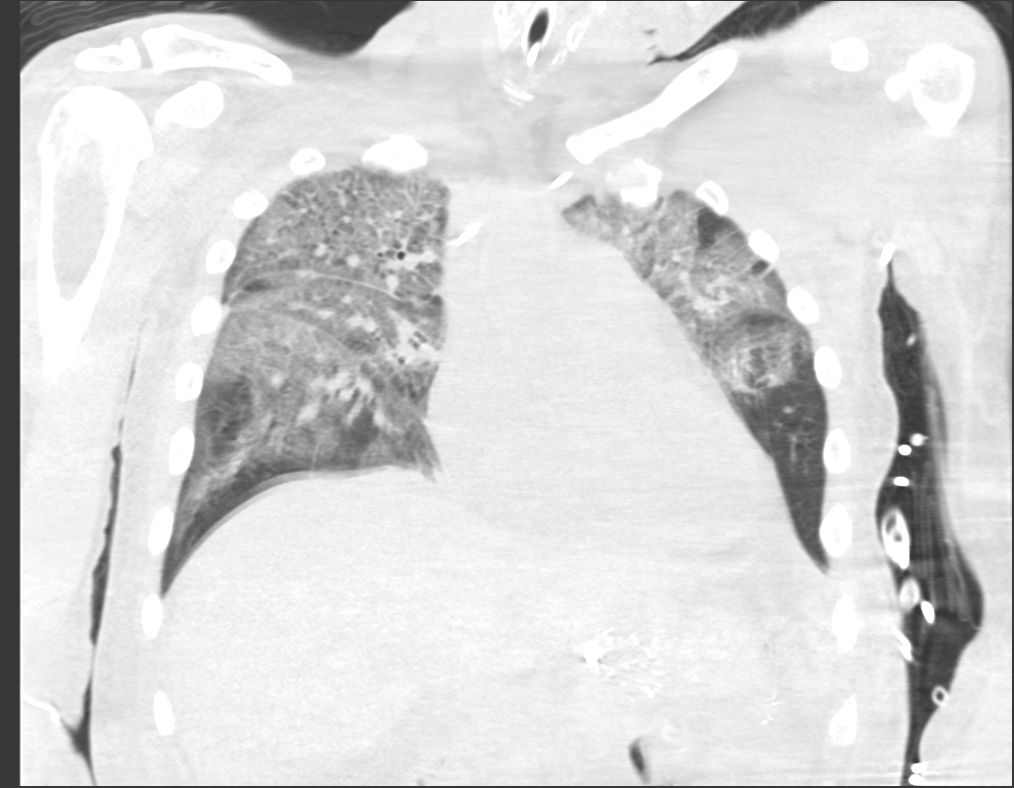
[im 77/128  lung]
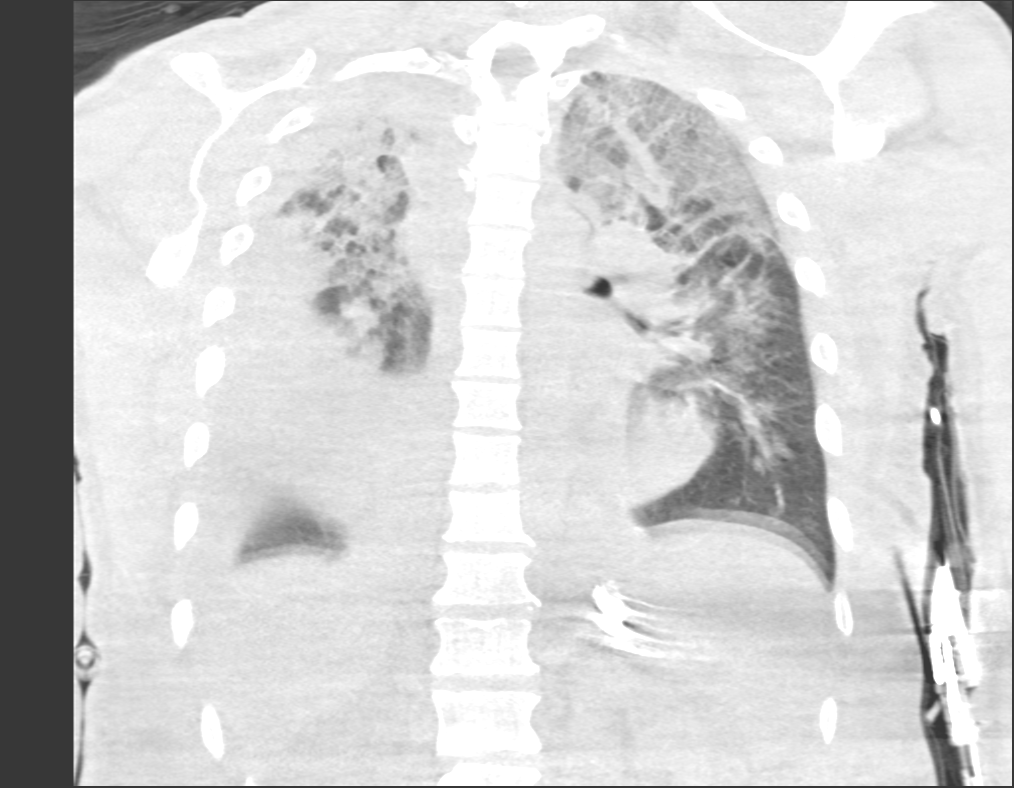

[11 of 36 positions shown; findings below may reference images not displayed]

FINDINGS: Cardiovascular: Left IJ central line terminates in the high SVC.
Pulmonary arteries appear enlarged. Heart size normal. No
pericardial effusion.

Mediastinum/Nodes: Mediastinal lymph nodes measure up to 9 mm in the
low right paratracheal station. There are calcified bi hilar lymph
nodes. No definite axillary adenopathy. Soft tissue resolution is
poor. Patient is cachectic.

Lungs/Pleura: Endotracheal tube terminates at the carina. Fairly
diffuse pulmonary parenchymal ground-glass, upper/ mid lung zone
predominant. Moderate narrowing bilateral pleural effusions. Image
quality is degraded by respiratory motion.

Upper Abdomen: Patient was scanned with arms down, creating a great
deal streak artifact through the upper abdomen, limiting diagnostic
quality. Nasogastric tube terminates in the stomach.

Musculoskeletal: No worrisome lytic or sclerotic lesions.
IMPRESSION: 1. Endotracheal tube terminates at the carina. Pulling back
approximately 3 cm would better position the tip above the carina.
Patient has had a chest radiograph subsequent to this dictation.
Please refer to that study and retract as clinically indicated.
2. Mid and upper lung zone predominant diffuse pulmonary parenchymal
ground-glass, worrisome for adult respiratory distress
syndrome/diffuse alveolar damage, in the appropriate clinical
setting.
3. Moderate simple appearing bilateral pleural effusions.
4. Pulmonary arteries appear enlarged, suggesting pulmonary arterial
hypertension.
